# Patient Record
Sex: Female | Born: 1935 | Race: White | Hispanic: No | Marital: Married | Smoking: Current every day smoker
Health system: Southern US, Community
[De-identification: ages and names within clinical notes are randomized; demographics above are authoritative.]

## PROBLEM LIST (undated history)

## (undated) DIAGNOSIS — L409 Psoriasis, unspecified: Secondary | ICD-10-CM

## (undated) DIAGNOSIS — M810 Age-related osteoporosis without current pathological fracture: Secondary | ICD-10-CM

## (undated) DIAGNOSIS — Z972 Presence of dental prosthetic device (complete) (partial): Secondary | ICD-10-CM

## (undated) DIAGNOSIS — E119 Type 2 diabetes mellitus without complications: Secondary | ICD-10-CM

## (undated) DIAGNOSIS — R42 Dizziness and giddiness: Secondary | ICD-10-CM

## (undated) DIAGNOSIS — E039 Hypothyroidism, unspecified: Secondary | ICD-10-CM

## (undated) DIAGNOSIS — C50919 Malignant neoplasm of unspecified site of unspecified female breast: Secondary | ICD-10-CM

## (undated) DIAGNOSIS — J449 Chronic obstructive pulmonary disease, unspecified: Secondary | ICD-10-CM

## (undated) DIAGNOSIS — E079 Disorder of thyroid, unspecified: Secondary | ICD-10-CM

## (undated) DIAGNOSIS — E785 Hyperlipidemia, unspecified: Secondary | ICD-10-CM

## (undated) DIAGNOSIS — F1910 Other psychoactive substance abuse, uncomplicated: Secondary | ICD-10-CM

## (undated) HISTORY — PX: CHOLECYSTECTOMY: SHX55

## (undated) HISTORY — PX: APPENDECTOMY: SHX54

## (undated) HISTORY — DX: Malignant neoplasm of unspecified site of unspecified female breast: C50.919

## (undated) HISTORY — DX: Hypothyroidism, unspecified: E03.9

## (undated) HISTORY — PX: TONSILLECTOMY: SUR1361

## (undated) HISTORY — DX: Age-related osteoporosis without current pathological fracture: M81.0

---

## 2000-12-14 HISTORY — PX: BREAST BIOPSY: SHX20

## 2005-06-26 ENCOUNTER — Ambulatory Visit: Payer: Self-pay | Admitting: Internal Medicine

## 2006-01-05 ENCOUNTER — Ambulatory Visit: Payer: Self-pay | Admitting: Gastroenterology

## 2006-07-01 ENCOUNTER — Ambulatory Visit: Payer: Self-pay | Admitting: Internal Medicine

## 2007-07-05 ENCOUNTER — Ambulatory Visit: Payer: Self-pay | Admitting: Internal Medicine

## 2008-07-05 ENCOUNTER — Ambulatory Visit: Payer: Self-pay | Admitting: Internal Medicine

## 2009-07-08 ENCOUNTER — Ambulatory Visit: Payer: Self-pay | Admitting: Internal Medicine

## 2009-07-16 ENCOUNTER — Ambulatory Visit: Payer: Self-pay | Admitting: Unknown Physician Specialty

## 2010-01-09 ENCOUNTER — Ambulatory Visit: Payer: Self-pay | Admitting: Internal Medicine

## 2011-02-17 ENCOUNTER — Ambulatory Visit: Payer: Self-pay | Admitting: Internal Medicine

## 2012-02-01 ENCOUNTER — Ambulatory Visit: Payer: Self-pay | Admitting: Internal Medicine

## 2012-04-05 ENCOUNTER — Ambulatory Visit: Payer: Self-pay | Admitting: Internal Medicine

## 2013-01-12 ENCOUNTER — Ambulatory Visit: Payer: Self-pay

## 2013-05-18 ENCOUNTER — Ambulatory Visit: Payer: Self-pay | Admitting: Internal Medicine

## 2014-05-22 ENCOUNTER — Ambulatory Visit: Payer: Self-pay | Admitting: Internal Medicine

## 2014-11-22 DIAGNOSIS — H02831 Dermatochalasis of right upper eyelid: Secondary | ICD-10-CM | POA: Insufficient documentation

## 2014-11-22 DIAGNOSIS — H02834 Dermatochalasis of left upper eyelid: Secondary | ICD-10-CM | POA: Insufficient documentation

## 2014-12-14 DIAGNOSIS — C50919 Malignant neoplasm of unspecified site of unspecified female breast: Secondary | ICD-10-CM

## 2014-12-14 HISTORY — DX: Malignant neoplasm of unspecified site of unspecified female breast: C50.919

## 2015-01-03 ENCOUNTER — Ambulatory Visit: Payer: Self-pay | Admitting: Internal Medicine

## 2015-01-08 ENCOUNTER — Ambulatory Visit: Payer: Self-pay | Admitting: Internal Medicine

## 2015-01-14 ENCOUNTER — Ambulatory Visit: Payer: Self-pay | Admitting: Oncology

## 2015-01-28 ENCOUNTER — Ambulatory Visit: Payer: Self-pay | Admitting: Surgery

## 2015-02-05 ENCOUNTER — Ambulatory Visit: Payer: Self-pay | Admitting: Surgery

## 2015-02-05 HISTORY — PX: BREAST EXCISIONAL BIOPSY: SUR124

## 2015-02-12 ENCOUNTER — Ambulatory Visit
Admit: 2015-02-12 | Disposition: A | Discharge status: Home / Self Care | Payer: Self-pay | Attending: Oncology | Admitting: Oncology

## 2015-03-15 ENCOUNTER — Ambulatory Visit
Admit: 2015-03-15 | Disposition: A | Discharge status: Home / Self Care | Payer: Self-pay | Attending: Oncology | Admitting: Oncology

## 2015-03-28 LAB — CBC CANCER CENTER
Basophil #: 0.1 x10 3/mm (ref 0.0–0.1)
Basophil %: 1.2 %
EOS PCT: 3.5 %
Eosinophil #: 0.4 x10 3/mm (ref 0.0–0.7)
HCT: 28.5 % — ABNORMAL LOW (ref 35.0–47.0)
HGB: 8 g/dL — ABNORMAL LOW (ref 12.0–16.0)
Lymphocyte #: 1.8 x10 3/mm (ref 1.0–3.6)
Lymphocyte %: 16.3 %
MCH: 17.2 pg — ABNORMAL LOW (ref 26.0–34.0)
MCHC: 27.9 g/dL — ABNORMAL LOW (ref 32.0–36.0)
MCV: 62 fL — ABNORMAL LOW (ref 80–100)
MONO ABS: 0.9 x10 3/mm (ref 0.2–0.9)
MONOS PCT: 8.4 %
NEUTROS PCT: 70.6 %
Neutrophil #: 7.9 x10 3/mm — ABNORMAL HIGH (ref 1.4–6.5)
PLATELETS: 298 x10 3/mm (ref 150–440)
RBC: 4.61 10*6/uL (ref 3.80–5.20)
RDW: 19 % — ABNORMAL HIGH (ref 11.5–14.5)
WBC: 11.2 x10 3/mm — AB (ref 3.6–11.0)

## 2015-04-04 LAB — CBC CANCER CENTER
BASOS PCT: 2.3 %
Basophil #: 0.2 x10 3/mm — ABNORMAL HIGH (ref 0.0–0.1)
EOS ABS: 0.5 x10 3/mm (ref 0.0–0.7)
Eosinophil %: 6.8 %
HCT: 27.6 % — ABNORMAL LOW (ref 35.0–47.0)
HGB: 8 g/dL — ABNORMAL LOW (ref 12.0–16.0)
LYMPHS PCT: 29.5 %
Lymphocyte #: 2.2 x10 3/mm (ref 1.0–3.6)
MCH: 17.5 pg — AB (ref 26.0–34.0)
MCHC: 28.9 g/dL — AB (ref 32.0–36.0)
MCV: 61 fL — ABNORMAL LOW (ref 80–100)
Monocyte #: 0.7 x10 3/mm (ref 0.2–0.9)
Monocyte %: 9.6 %
NEUTROS ABS: 3.8 x10 3/mm (ref 1.4–6.5)
Neutrophil %: 51.8 %
Platelet: 293 x10 3/mm (ref 150–440)
RBC: 4.54 10*6/uL (ref 3.80–5.20)
RDW: 18.4 % — ABNORMAL HIGH (ref 11.5–14.5)
WBC: 7.4 x10 3/mm (ref 3.6–11.0)

## 2015-04-08 LAB — SURGICAL PATHOLOGY

## 2015-04-11 LAB — CBC CANCER CENTER
Basophil #: 0.2 x10 3/mm — ABNORMAL HIGH (ref 0.0–0.1)
Basophil %: 2.4 %
EOS ABS: 0.4 x10 3/mm (ref 0.0–0.7)
EOS PCT: 6.1 %
HCT: 25.6 % — ABNORMAL LOW (ref 35.0–47.0)
HGB: 7.3 g/dL — AB (ref 12.0–16.0)
LYMPHS ABS: 1.8 x10 3/mm (ref 1.0–3.6)
Lymphocyte %: 25.8 %
MCH: 17.5 pg — ABNORMAL LOW (ref 26.0–34.0)
MCHC: 28.5 g/dL — AB (ref 32.0–36.0)
MCV: 61 fL — ABNORMAL LOW (ref 80–100)
Monocyte #: 0.6 x10 3/mm (ref 0.2–0.9)
Monocyte %: 9.5 %
Neutrophil #: 3.8 x10 3/mm (ref 1.4–6.5)
Neutrophil %: 56.2 %
Platelet: 252 x10 3/mm (ref 150–440)
RBC: 4.17 10*6/uL (ref 3.80–5.20)
RDW: 18.8 % — ABNORMAL HIGH (ref 11.5–14.5)
WBC: 6.8 x10 3/mm (ref 3.6–11.0)

## 2015-04-14 NOTE — Consult Note (Signed)
Reason for Visit: This 80 year old Female patient presents to the clinic for initial evaluation of  breast cancer .   Referred by Dr. Oliva Bustard.  Diagnosis:  Chief Complaint/Diagnosis   80 year old female status post wide local excision sent a node biopsy for stage IA (T1b N0 M0) ER/PR positive HER-2/neu negative invasive mammary carcinoma for whole breast radiation and aromatase inhibitor therapy  Pathology Report pathology report reviewed   Imaging Report mammograms and ultrasound reviewed   Referral Report clinical notes reviewed   Planned Treatment Regimen whole breast radiation plus aromatase inhibitor   HPI   patient is a 80 year old female who presented with self discovered mass in the right breast promptingan ultrasound and mammogram showing a mass in the right breast in the 2:00 position. Underwent ultrasound-guided biopsy showing invasive mammary carcinoma ER/PR positive HER-2/neu not overexpressed. Patient underwent a wide local excision and sentinel node biopsy for 0.6 cm grade 2 invasive mammary carcinoma with margins clear at 2 mm. One sentinel lymph node was negative for metastatic disease there was no lymphovascular invasion noted. Patient's been seen by medical oncology has been offered aromatase inhibitor therapy. She is now referred to radiation oncology for consideration of treatment. She is doing well. She specifically denies breast tenderness cough or bone pain.  Past Hx:    Hypothyroidism:    Osteoporosis:    Cesarean Section:    Breast Biopsy - Right:    Cholecystectomy:    Appendectomy:   Past, Family and Social History:  Past Medical History positive   Gastrointestinal GERD   Endocrine diabetes mellitus; hypothyroidism   Past Surgical History cholecystectomy; C-section   Past Medical History Comments osteoporosis   Family History noncontributory   Social History positive   Social History Comments greater than 50-pack-year smoking history no  EtOH abuse history   Additional Past Medical and Surgical History accompanied by her husband today   Allergies:   Sulfa drugs: Swelling  Codeine: N/V/Diarrhea  Home Meds:  Home Medications: Medication Instructions Status  Calcium 600+D 600 mg-200 units oral tablet 1 tab(s) orally 2 times a day Active  letrozole 2.5 mg oral tablet 1 tab(s) orally once a day Active  Tylenol PM 500 mg-25 mg oral tablet 2 tab(s) orally once a day (at bedtime) Active  LORazepam 0.5 mg oral tablet 1 tab(s) orally 3 times a day, As Needed - for Anxiety, Nervousness Active  levothyroxine 137 mcg (0.137 mg) oral capsule 1 cap(s) orally once a day Active  simvastatin 40 mg oral tablet 1 tab(s) orally once a day (at bedtime) Active  Bayer Aspirin 81 mg oral delayed release tablet 1 tab(s) orally once a day Active  Otezla 30 mg oral tablet 1 tab(s) orally once a day Active  glipiZIDE 10 mg oral tablet 1 tab(s) orally 2 times a day Active  acitretin 10 mg oral capsule 1 cap(s) orally every other day Active  metFORMIN 1000 mg oral tablet 1 tab(s) orally 2 times a day Active  clonazePAM 0.5 mg oral tablet 1 tab(s) orally once a day (at bedtime), As Needed Active  omeprazole 20 mg oral delayed release tablet 1 tab(s) orally once a day Active  Slow Fe (as elemental iron) 45 mg oral tablet, extended release 1 tab(s) orally once a day Active  Norco 325 mg-5 mg oral tablet 1 -2  tab(s) orally every 4 hours as needed for pain Active   Review of Systems:  General negative   Performance Status (ECOG) 0   Skin negative  Breast see HPI   Ophthalmologic negative   ENMT negative   Respiratory and Thorax negative   Cardiovascular negative   Gastrointestinal negative   Genitourinary negative   Musculoskeletal negative   Neurological negative   Psychiatric negative   Hematology/Lymphatics negative   Endocrine negative   Allergic/Immunologic negative   Review of Systems   denies any weight loss,  fatigue, weakness, fever, chills or night sweats. Patient denies any loss of vision, blurred vision. Patient denies any ringing  of the ears or hearing loss. No irregular heartbeat. Patient denies heart murmur or history of fainting. Patient denies any chest pain or pain radiating to her upper extremities. Patient denies any shortness of breath, difficulty breathing at night, cough or hemoptysis. Patient denies any swelling in the lower legs. Patient denies any nausea vomiting, vomiting of blood, or coffee ground material in the vomitus. Patient denies any stomach pain. Patient states has had normal bowel movements no significant constipation or diarrhea. Patient denies any dysuria, hematuria or significant nocturia. Patient denies any problems walking, swelling in the joints or loss of balance. Patient denies any skin changes, loss of hair or loss of weight. Patient denies any excessive worrying or anxiety or significant depression. Patient denies any problems with insomnia. Patient denies excessive thirst, polyuria, polydipsia. Patient denies any swollen glands, patient denies easy bruising or easy bleeding. Patient denies any recent infections, allergies or URI. Patient "s visual fields have not changed significantly in recent time.   Nursing Notes:  Nursing Vital Signs and Chemo Nursing Nursing Notes: *CC Vital Signs Flowsheet:   21-Mar-16 14:21  Temp Temperature 96.3  Pulse Pulse 100  Respirations Respirations 18  SBP SBP 128  DBP DBP 62  Pain Scale (0-10)  0  Current Weight (kg) (kg) 63.8  Height (cm) centimeters 160.2  BSA (m2) 1.6   Physical Exam:  General/Skin/HEENT:  General normal   Skin normal   Eyes normal   ENMT normal   Head and Neck normal   Additional PE well-developed well-nourished female in NAD. Lungs are clear to A&P cardiac examination shows regular rate and rhythm. Abdomen is benign. Right breast is a wide local excision's which is healed well. No dominant mass or  nodularity is noted in either breast in 2 positions examined. No axillary or supraclavicular adenopathy is appreciated.   Breasts/Resp/CV/GI/GU:  Respiratory and Thorax normal   Cardiovascular normal   Gastrointestinal normal   Genitourinary normal   MS/Neuro/Psych/Lymph:  Musculoskeletal normal   Neurological normal   Lymphatics normal   Other Results:  Radiology Results: LabUnknown:    09-Jun-15 11:30, Screening Digital Mammogram  PACS [place]:    23-Feb-16 08:48, Breast Needle Localization -Right Breast  Breast Needle Localization -Right Breast   REASON FOR EXAM:    NL 8A SN 845A SURG 1030A  COMMENTS:       PROCEDURE: MAM - MAM BREAST NEEDLE LOCAL RT  - Feb 05 2015  8:48AM     CLINICAL DATA:  Recently diagnosed invasive mammary carcinoma in the  2 o'clock position of the right breast.    EXAM:  NEEDLE LOCALIZATION OF THE RIGHT BREAST WITH MAMMO GUIDANCE    COMPARISON:  Previous exams.    FINDINGS:  Patient presents for needle localization prior to right lumpectomy.  I met with the patient and we discussed the procedure of needle  localization including benefits and alternatives. We discussed the  high likelihood of a successful procedure. We discussed the risks of  the procedure, including infection, bleeding, tissue injury, and  further surgery. Informed, written consent was given. The usual  time-out protocol was performed immediately prior to the procedure.    Using mammographic guidance, sterile technique, 2% lidocaine and a 5  cm modified Kopans needle, the recently biopsied mass and  subsequently placed biopsy marker clip inthe upper inner right  breast were localized using medial approach. The films were marked  for Dr. Tamala Julian.     IMPRESSION:  Needle localization right breast. No apparent complications.  Electronically Signed    By: Claudie Revering M.D.    On: 02/05/2015 09:18         Verified By: Gerald Stabs, M.D.,   Relevent  Results:   Relevant Scans and Labs mammograms and ultrasound reviewed   Assessment and Plan: Impression:   stage IA invasive mammary carcinoma the right breast as was wide local excision and sentinel node biopsy in 80 year old female ER/PR positive HER-2/neu not overexpressed for whole breast radiation plus aromatase inhibitor Plan:   at this time I have recommended whole breast radiation. With treatment hypofractionated regimen over 4 weeks boosting her scar another 1600 cGy in 8 fractions based on the close 2 mm margin. Risks and benefits of treatment including skin reaction, fatigue, inclusion of some superficial lung, possible alteration of blood counts, all were discussed in detail with the patient and her husband. They both seem to comprehend my treatment plan well.I have set up and ordered CT simulation later this week. Patient will also be candidate for aromatase inhibitor therapy after completion of radiation.  I would like to take this opportunity for allowing me to participate in the care of your patient..  Fax to Physician:  Physicians To Recieve Fax: Madelyn Brunner, MD - [phone] Rochel Brome - [phone].  Electronic Signatures: Armstead Peaks (MD)  (Signed 21-Mar-16 15:09)  Authored: HPI, Diagnosis, Past Hx, PFSH, Allergies, Home Meds, ROS, Nursing Notes, Physical Exam, Other Results, Relevent Results, Encounter Assessment and Plan, Fax to Physician   Last Updated: 21-Mar-16 15:09 by Armstead Peaks (MD)

## 2015-04-14 NOTE — Op Note (Signed)
PATIENT NAME:  [name], [name] MR#:  [id number] DATE OF BIRTH:  1935-12-06  DATE OF PROCEDURE:  02/05/2015  PREOPERATIVE DIAGNOSIS: Carcinoma of the right breast.   POSTOPERATIVE DIAGNOSIS: Carcinoma of the right breast.   PROCEDURE: Right partial mastectomy with axillary sentinel lymph node biopsy.   SURGEON: Rochel Brome, M.D.   ANESTHESIA: General.   INDICATIONS: This 80 year old female recently discovered a palpable mass at the 2 o'clock position of the right breast. She had a mammogram and ultrasound demonstrating a 9 mm density at this site. Biopsies demonstrated an infiltrating mammary carcinoma. She had preoperative x-ray needle localization and injection of radioactive technetium sulfur colloid.   DESCRIPTION OF PROCEDURE: The patient was placed on the operating table in the supine position under general anesthesia. The dressing was removed from the right breast exposing the Kopans wire, which entered the breast at approximately the 2 o'clock position. Mammogram images were reviewed prior to surgery. The wire was cut 2 cm from the skin. The breast and axilla were prepared with ChloraPrep and draped in a sterile manner.   An obliquely oriented curvilinear incision was made from approximately 12:30 to 3:30 position of the right breast, just medial to the entrance point of the wire just about 2 cm from the areola. An ellipse of skin was included with the specimen and dissection was carried down to encounter the wire and also could palpate the mass and dissection was carried out around the mass, removing some normal-appearing tissue with the mass. The 330 end of the skin ellipse was tagged with a 3-0 nylon stitch. Also margin maps were used to mark the medial, lateral, cranial, caudal, and deep margins. The mass was submitted for specimen mammogram and pathology. The wound was inspected and determined hemostasis was intact.   Next, attention was turned to the axilla which was probed with a  gamma counter demonstrating location of radioactivity in the inferior aspect of the axilla. An obliquely oriented incision was made some 4 cm in length, carried down through subcutaneous tissues, through superficial fascia. Several small bleeding points were cauterized. Dissection was carried down deep into the axilla adjacent to the rib cage and using the gamma counter demonstrated radioactivity and an axillary lymph node which was dissected free from surrounding structures but also included a small amount of fat surrounding the lymph node. The ex vivo count was in the range of 25-35, background count was minimal. The lymph node was submitted for routine pathology. The wound was further explored with digital examination and found no hard palpable mass.  The hemostasis was intact. The axillary wound was closed with 4-0 chromic subcutaneous sutures in running 4-0 Monocryl subcuticular suture.  It should be noted that the radiologist called to report that the biopsy marker was seen centrally located in the specimen, and the specimen was sent for pathology, and the pathologist later called back to report that the margins of resection appeared clear.   The partial mastectomy wound was further inspected and it determined hemostasis was intact. Subcutaneous tissues for both incisions were infiltrated with 0.5% Sensorcaine with epinephrine. The subcutaneous tissues were approximated with interrupted inverted 4-0 chromic. The skin was closed with running 4-0 Monocryl subcuticular suture. Both wounds were treated with LiquiBand and allowed to dry.   The patient appeared to tolerate the procedure satisfactorily and was prepared for transfer to the recovery room.   ____________________________ Lenna Sciara. Rochel Brome, MD jws:mc D: 02/05/2015 11:50:13 ET T: 02/05/2015 16:24:38 ET JOB#: [id number]  cc: J.  Rochel Brome, MD, <Dictator> Loreli Dollar MD ELECTRONICALLY SIGNED 02/06/2015 18:11

## 2015-04-14 NOTE — Op Note (Signed)
PATIENT NAME:  [name], [name] MR#:  [id number] DATE OF BIRTH:  Nov 19, 1935  DATE OF PROCEDURE:  02/05/2015  PREOPERATIVE DIAGNOSIS: Carcinoma of the right breast.   POSTOPERATIVE DIAGNOSIS: Carcinoma of the right breast.   PROCEDURE: Right partial mastectomy with axillary sentinel lymph node biopsy.   SURGEON: Rochel Brome, M.D.   ANESTHESIA: General.   INDICATIONS: This 81 year old female recently discovered a palpable mass at the 2 o'clock position of the right breast. She had a mammogram and ultrasound demonstrating a 9 mm density at this site. Biopsies demonstrated an infiltrating mammary carcinoma. She had preoperative x-ray needle localization and injection of radioactive technetium sulfur colloid.   DESCRIPTION OF PROCEDURE: The patient was placed on the operating table in the supine position under general anesthesia. The dressing was removed from the right breast exposing the Kopans wire, which entered the breast at approximately the 2 o'clock position. Mammogram images were reviewed prior to surgery. The wire was cut 2 cm from the skin. The breast and axilla were prepared with ChloraPrep and draped in a sterile manner.   An obliquely oriented curvilinear incision was made from approximately 12:30 to 3:30 position of the right breast, just medial to the entrance point of the wire just about 2 cm from the areola. An ellipse of skin was included with the specimen and dissection was carried down to encounter the wire and also could palpate the mass and dissection was carried out around the mass, removing some normal-appearing tissue with the mass. The 330 end of the skin ellipse was tagged with a 3-0 nylon stitch. Also margin maps were used to mark the medial, lateral, cranial, caudal, and deep margins. The mass was submitted for specimen mammogram and pathology. The wound was inspected and determined hemostasis was intact.   Next, attention was turned to the axilla which was probed with a  gamma counter demonstrating location of radioactivity in the inferior aspect of the axilla. An obliquely oriented incision was made some 4 cm in length, carried down through subcutaneous tissues, through superficial fascia. Several small bleeding points were cauterized. Dissection was carried down deep into the axilla adjacent to the rib cage and using the gamma counter demonstrated radioactivity and an axillary lymph node which was dissected free from surrounding structures but also included a small amount of fat surrounding the lymph node. The ex vivo count was in the range of 25-35, background count was minimal. The lymph node was submitted for routine pathology. The wound was further explored with digital examination and found no hard palpable mass.  The hemostasis was intact. The axillary wound was closed with 4-0 chromic subcutaneous sutures in running 4-0 Monocryl subcuticular suture.  It should be noted that the radiologist called to report that the biopsy marker was seen centrally located in the specimen, and the specimen was sent for pathology, and the pathologist later called back to report that the margins of resection appeared clear.   The partial mastectomy wound was further inspected and it determined hemostasis was intact. Subcutaneous tissues for both incisions were infiltrated with 0.5% Sensorcaine with epinephrine. The subcutaneous tissues were approximated with interrupted inverted 4-0 chromic. The skin was closed with running 4-0 Monocryl subcuticular suture. Both wounds were treated with LiquiBand and allowed to dry.   The patient appeared to tolerate the procedure satisfactorily and was prepared for transfer to the recovery room.   ____________________________ Lenna Sciara. Rochel Brome, MD jws:mc D: 02/05/2015 11:50:13 ET T: 02/05/2015 16:24:38 ET JOB#: [id number]  cc: J.  Rochel Brome, MD, <Dictator> Loreli Dollar MD ELECTRONICALLY SIGNED 02/06/2015 18:11

## 2015-04-15 ENCOUNTER — Ambulatory Visit: Payer: Medicare Other | Admitting: Radiation Oncology

## 2015-04-15 DIAGNOSIS — Z510 Encounter for antineoplastic radiation therapy: Secondary | ICD-10-CM | POA: Diagnosis present

## 2015-04-15 DIAGNOSIS — Z170 Estrogen receptor positive status [ER+]: Secondary | ICD-10-CM | POA: Insufficient documentation

## 2015-04-15 DIAGNOSIS — C50211 Malignant neoplasm of upper-inner quadrant of right female breast: Secondary | ICD-10-CM | POA: Insufficient documentation

## 2015-04-16 DIAGNOSIS — Z510 Encounter for antineoplastic radiation therapy: Secondary | ICD-10-CM | POA: Diagnosis not present

## 2015-04-17 DIAGNOSIS — Z510 Encounter for antineoplastic radiation therapy: Secondary | ICD-10-CM | POA: Diagnosis not present

## 2015-04-18 DIAGNOSIS — Z510 Encounter for antineoplastic radiation therapy: Secondary | ICD-10-CM | POA: Diagnosis not present

## 2015-04-19 ENCOUNTER — Ambulatory Visit
Admission: RE | Admit: 2015-04-19 | Discharge: 2015-04-19 | Disposition: A | Discharge status: Home / Self Care | Payer: Medicare Other | Source: Ambulatory Visit | Attending: Radiation Oncology | Admitting: Radiation Oncology

## 2015-04-19 DIAGNOSIS — Z510 Encounter for antineoplastic radiation therapy: Secondary | ICD-10-CM | POA: Diagnosis not present

## 2015-05-30 ENCOUNTER — Ambulatory Visit: Payer: Self-pay | Admitting: Radiation Oncology

## 2015-06-11 DIAGNOSIS — Z853 Personal history of malignant neoplasm of breast: Secondary | ICD-10-CM | POA: Insufficient documentation

## 2015-06-18 ENCOUNTER — Ambulatory Visit: Payer: Self-pay | Admitting: Oncology

## 2015-06-18 ENCOUNTER — Other Ambulatory Visit: Payer: Self-pay

## 2015-10-01 ENCOUNTER — Other Ambulatory Visit: Payer: Self-pay | Admitting: Internal Medicine

## 2015-10-01 DIAGNOSIS — N63 Unspecified lump in unspecified breast: Secondary | ICD-10-CM

## 2015-10-10 ENCOUNTER — Ambulatory Visit
Admission: RE | Admit: 2015-10-10 | Discharge: 2015-10-10 | Disposition: A | Discharge status: Home / Self Care | Payer: Medicare Other | Source: Ambulatory Visit | Attending: Internal Medicine | Admitting: Internal Medicine

## 2015-10-10 ENCOUNTER — Other Ambulatory Visit: Payer: Self-pay | Admitting: Internal Medicine

## 2015-10-10 DIAGNOSIS — D242 Benign neoplasm of left breast: Secondary | ICD-10-CM | POA: Diagnosis not present

## 2015-10-10 DIAGNOSIS — N63 Unspecified lump in unspecified breast: Secondary | ICD-10-CM

## 2015-10-10 DIAGNOSIS — R921 Mammographic calcification found on diagnostic imaging of breast: Secondary | ICD-10-CM | POA: Diagnosis not present

## 2015-10-14 ENCOUNTER — Other Ambulatory Visit: Payer: Self-pay | Admitting: Internal Medicine

## 2015-10-14 DIAGNOSIS — N63 Unspecified lump in unspecified breast: Secondary | ICD-10-CM

## 2016-01-23 ENCOUNTER — Other Ambulatory Visit: Payer: Self-pay | Admitting: Internal Medicine

## 2016-01-23 DIAGNOSIS — R1012 Left upper quadrant pain: Secondary | ICD-10-CM

## 2016-01-27 ENCOUNTER — Ambulatory Visit
Admission: RE | Admit: 2016-01-27 | Discharge: 2016-01-27 | Disposition: A | Discharge status: Home / Self Care | Payer: Medicare Other | Source: Ambulatory Visit | Attending: Internal Medicine | Admitting: Internal Medicine

## 2016-01-27 DIAGNOSIS — R1012 Left upper quadrant pain: Secondary | ICD-10-CM | POA: Diagnosis present

## 2016-01-27 DIAGNOSIS — K5730 Diverticulosis of large intestine without perforation or abscess without bleeding: Secondary | ICD-10-CM | POA: Diagnosis not present

## 2016-01-27 HISTORY — DX: Type 2 diabetes mellitus without complications: E11.9

## 2016-01-27 MED ORDER — IOHEXOL 300 MG/ML  SOLN
100.0000 mL | Freq: Once | INTRAMUSCULAR | Status: AC | PRN
  Administered 2016-01-27: 100 mL via INTRAVENOUS

## 2016-03-04 ENCOUNTER — Encounter: Payer: Self-pay | Admitting: *Deleted

## 2016-03-05 ENCOUNTER — Ambulatory Visit: Payer: Medicare Other | Admitting: Anesthesiology

## 2016-03-05 ENCOUNTER — Ambulatory Visit
Admission: RE | Admit: 2016-03-05 | Discharge: 2016-03-05 | Disposition: A | Discharge status: Home / Self Care | Payer: Medicare Other | Source: Ambulatory Visit | Attending: Unknown Physician Specialty | Admitting: Unknown Physician Specialty

## 2016-03-05 ENCOUNTER — Encounter: Payer: Self-pay | Admitting: *Deleted

## 2016-03-05 ENCOUNTER — Encounter
Admission: RE | Disposition: A | Discharge status: Home / Self Care | Payer: Self-pay | Source: Ambulatory Visit | Attending: Unknown Physician Specialty

## 2016-03-05 DIAGNOSIS — Z7982 Long term (current) use of aspirin: Secondary | ICD-10-CM | POA: Insufficient documentation

## 2016-03-05 DIAGNOSIS — Q2733 Arteriovenous malformation of digestive system vessel: Secondary | ICD-10-CM | POA: Insufficient documentation

## 2016-03-05 DIAGNOSIS — Z8249 Family history of ischemic heart disease and other diseases of the circulatory system: Secondary | ICD-10-CM | POA: Insufficient documentation

## 2016-03-05 DIAGNOSIS — Z833 Family history of diabetes mellitus: Secondary | ICD-10-CM | POA: Insufficient documentation

## 2016-03-05 DIAGNOSIS — E039 Hypothyroidism, unspecified: Secondary | ICD-10-CM | POA: Insufficient documentation

## 2016-03-05 DIAGNOSIS — Z882 Allergy status to sulfonamides status: Secondary | ICD-10-CM | POA: Insufficient documentation

## 2016-03-05 DIAGNOSIS — R1013 Epigastric pain: Secondary | ICD-10-CM | POA: Diagnosis not present

## 2016-03-05 DIAGNOSIS — L409 Psoriasis, unspecified: Secondary | ICD-10-CM | POA: Insufficient documentation

## 2016-03-05 DIAGNOSIS — E119 Type 2 diabetes mellitus without complications: Secondary | ICD-10-CM | POA: Diagnosis not present

## 2016-03-05 DIAGNOSIS — K319 Disease of stomach and duodenum, unspecified: Secondary | ICD-10-CM | POA: Diagnosis not present

## 2016-03-05 DIAGNOSIS — K640 First degree hemorrhoids: Secondary | ICD-10-CM | POA: Diagnosis not present

## 2016-03-05 DIAGNOSIS — R1012 Left upper quadrant pain: Secondary | ICD-10-CM | POA: Insufficient documentation

## 2016-03-05 DIAGNOSIS — F17200 Nicotine dependence, unspecified, uncomplicated: Secondary | ICD-10-CM | POA: Insufficient documentation

## 2016-03-05 DIAGNOSIS — J449 Chronic obstructive pulmonary disease, unspecified: Secondary | ICD-10-CM | POA: Diagnosis not present

## 2016-03-05 DIAGNOSIS — Z836 Family history of other diseases of the respiratory system: Secondary | ICD-10-CM | POA: Insufficient documentation

## 2016-03-05 DIAGNOSIS — R12 Heartburn: Secondary | ICD-10-CM | POA: Diagnosis not present

## 2016-03-05 DIAGNOSIS — Z885 Allergy status to narcotic agent status: Secondary | ICD-10-CM | POA: Diagnosis not present

## 2016-03-05 DIAGNOSIS — K219 Gastro-esophageal reflux disease without esophagitis: Secondary | ICD-10-CM | POA: Diagnosis not present

## 2016-03-05 DIAGNOSIS — K5730 Diverticulosis of large intestine without perforation or abscess without bleeding: Secondary | ICD-10-CM | POA: Insufficient documentation

## 2016-03-05 DIAGNOSIS — D123 Benign neoplasm of transverse colon: Secondary | ICD-10-CM | POA: Insufficient documentation

## 2016-03-05 DIAGNOSIS — E785 Hyperlipidemia, unspecified: Secondary | ICD-10-CM | POA: Insufficient documentation

## 2016-03-05 DIAGNOSIS — Z79899 Other long term (current) drug therapy: Secondary | ICD-10-CM | POA: Insufficient documentation

## 2016-03-05 DIAGNOSIS — Z9049 Acquired absence of other specified parts of digestive tract: Secondary | ICD-10-CM | POA: Insufficient documentation

## 2016-03-05 DIAGNOSIS — M810 Age-related osteoporosis without current pathological fracture: Secondary | ICD-10-CM | POA: Insufficient documentation

## 2016-03-05 DIAGNOSIS — C50919 Malignant neoplasm of unspecified site of unspecified female breast: Secondary | ICD-10-CM | POA: Insufficient documentation

## 2016-03-05 DIAGNOSIS — K2970 Gastritis, unspecified, without bleeding: Secondary | ICD-10-CM | POA: Diagnosis not present

## 2016-03-05 DIAGNOSIS — K449 Diaphragmatic hernia without obstruction or gangrene: Secondary | ICD-10-CM | POA: Insufficient documentation

## 2016-03-05 DIAGNOSIS — D509 Iron deficiency anemia, unspecified: Secondary | ICD-10-CM | POA: Diagnosis not present

## 2016-03-05 DIAGNOSIS — R1084 Generalized abdominal pain: Secondary | ICD-10-CM | POA: Insufficient documentation

## 2016-03-05 DIAGNOSIS — Z7984 Long term (current) use of oral hypoglycemic drugs: Secondary | ICD-10-CM | POA: Diagnosis not present

## 2016-03-05 DIAGNOSIS — Z923 Personal history of irradiation: Secondary | ICD-10-CM | POA: Diagnosis not present

## 2016-03-05 HISTORY — DX: Chronic obstructive pulmonary disease, unspecified: J44.9

## 2016-03-05 HISTORY — DX: Psoriasis, unspecified: L40.9

## 2016-03-05 HISTORY — DX: Other psychoactive substance abuse, uncomplicated: F19.10

## 2016-03-05 HISTORY — DX: Disorder of thyroid, unspecified: E07.9

## 2016-03-05 HISTORY — DX: Hyperlipidemia, unspecified: E78.5

## 2016-03-05 HISTORY — PX: COLONOSCOPY WITH PROPOFOL: SHX5780

## 2016-03-05 HISTORY — PX: ESOPHAGOGASTRODUODENOSCOPY (EGD) WITH PROPOFOL: SHX5813

## 2016-03-05 LAB — GLUCOSE, CAPILLARY: Glucose-Capillary: 190 mg/dL — ABNORMAL HIGH (ref 65–99)

## 2016-03-05 SURGERY — COLONOSCOPY WITH PROPOFOL
Anesthesia: General

## 2016-03-05 MED ORDER — FENTANYL CITRATE (PF) 100 MCG/2ML IJ SOLN
INTRAMUSCULAR | Status: DC | PRN
  Administered 2016-03-05: 50 ug via INTRAVENOUS

## 2016-03-05 MED ORDER — PROPOFOL 10 MG/ML IV BOLUS
INTRAVENOUS | Status: DC | PRN
  Administered 2016-03-05: 50 mg via INTRAVENOUS

## 2016-03-05 MED ORDER — MIDAZOLAM HCL 5 MG/5ML IJ SOLN
INTRAMUSCULAR | Status: DC | PRN
  Administered 2016-03-05: 1 mg via INTRAVENOUS

## 2016-03-05 MED ORDER — LIDOCAINE HCL (CARDIAC) 20 MG/ML IV SOLN
INTRAVENOUS | Status: DC | PRN
  Administered 2016-03-05: 30 mg via INTRAVENOUS

## 2016-03-05 MED ORDER — SODIUM CHLORIDE 0.9 % IV SOLN: INTRAVENOUS | Status: DC

## 2016-03-05 MED ORDER — PHENYLEPHRINE HCL 10 MG/ML IJ SOLN
INTRAMUSCULAR | Status: DC | PRN
  Administered 2016-03-05: 100 ug via INTRAVENOUS

## 2016-03-05 MED ORDER — PROPOFOL 500 MG/50ML IV EMUL
INTRAVENOUS | Status: DC | PRN
  Administered 2016-03-05: 140 ug/kg/min via INTRAVENOUS

## 2016-03-05 MED ORDER — SODIUM CHLORIDE 0.9 % IV SOLN
INTRAVENOUS | Status: DC
  Administered 2016-03-05: 09:00:00 via INTRAVENOUS

## 2016-03-05 NOTE — Anesthesia Preprocedure Evaluation (Signed)
Anesthesia Evaluation  Patient identified by MRN, date of birth, ID band Patient awake    Reviewed: Allergy & Precautions, NPO status , Patient's Chart, lab work & pertinent test results  History of Anesthesia Complications Negative for: history of anesthetic complications  Airway Mallampati: III       Dental  (+) Partial Upper   Pulmonary neg pulmonary ROS, COPD,  COPD inhaler, Current Smoker,           Cardiovascular      Neuro/Psych negative neurological ROS     GI/Hepatic negative GI ROS, Neg liver ROS, GERD  Medicated,  Endo/Other  diabetes, Type 2, Oral Hypoglycemic AgentsHypothyroidism   Renal/GU negative Renal ROS     Musculoskeletal   Abdominal   Peds  Hematology negative hematology ROS (+) anemia ,   Anesthesia Other Findings   Reproductive/Obstetrics                             Anesthesia Physical Anesthesia Plan  ASA: III  Anesthesia Plan: General   Post-op Pain Management:    Induction: Intravenous  Airway Management Planned: Nasal Cannula  Additional Equipment:   Intra-op Plan:   Post-operative Plan:   Informed Consent: I have reviewed the patients History and Physical, chart, labs and discussed the procedure including the risks, benefits and alternatives for the proposed anesthesia with the patient or authorized representative who has indicated his/her understanding and acceptance.     Plan Discussed with:   Anesthesia Plan Comments:         Anesthesia Quick Evaluation

## 2016-03-05 NOTE — Op Note (Signed)
[facility] Gastroenterology Patient Name: [name] Procedure Date: 03/05/2016 9:41 AM MRN: [id number] Account #: [id number] Date of Birth: [birth year] Admit Type: Outpatient Age: 81 Room: [facility] ENDO ROOM 1 Gender: Female Note Status: Finalized Procedure:            Colonoscopy Indications:          Generalized abdominal pain Providers:            Manya Silvas, MD Referring MD:         Hewitt Blade. Sarina Ser, MD (Referring MD) Medicines:            Propofol per Anesthesia Complications:        No immediate complications. Procedure:            Pre-Anesthesia Assessment:                       - After reviewing the risks and benefits, the patient                        was deemed in satisfactory condition to undergo the                        procedure.                       After obtaining informed consent, the colonoscope was                        passed under direct vision. Throughout the procedure,                        the patient's blood pressure, pulse, and oxygen                        saturations were monitored continuously. The                        Colonoscope was introduced through the anus and                        advanced to the the cecum, identified by appendiceal                        orifice and ileocecal valve. The colonoscopy was                        performed without difficulty. The patient tolerated the                        procedure well. The quality of the bowel preparation                        was adequate to identify polyps. Findings:      Three medium-sized localized angioectasias without bleeding were found       in the cecum. Coagulation for bleeding prevention using argon plasma at       0.5 liters/minute and 20 watts was successful. To prevent bleeding       post-intervention, five hemostatic clips were successfully placed. There       was no bleeding at the end of the procedure.  Many small-mouthed diverticula were  found in the sigmoid colon and       descending colon.      Internal hemorrhoids were found during endoscopy. The hemorrhoids were       small and Grade I (internal hemorrhoids that do not prolapse). Impression:           - Three non-bleeding colonic angioectasias. Treated                        with argon plasma coagulation (APC). Clips were placed.                       - Diverticulosis in the sigmoid colon and in the                        descending colon.                       - Internal hemorrhoids.                       - No specimens collected. Recommendation:       - The findings and recommendations were discussed with                        the patient's family. Manya Silvas, MD 03/05/2016 10:51:51 AM This report has been signed electronically. Number of Addenda: 0 Note Initiated On: 03/05/2016 9:41 AM Scope Withdrawal Time: 0 hours 24 minutes 16 seconds  Total Procedure Duration: 0 hours 32 minutes 24 seconds       [facility]

## 2016-03-05 NOTE — Op Note (Signed)
[facility] Gastroenterology Patient Name: [name] Procedure Date: 03/05/2016 9:44 AM MRN: [id number] Account #: [id number] Date of Birth: 1935/11/03 Admit Type: Outpatient Age: 81 Room: [facility] ENDO ROOM 1 Gender: Female Note Status: Finalized Procedure:            Upper GI endoscopy Indications:          Epigastric abdominal pain, Heartburn Providers:            Manya Silvas, MD Referring MD:         Hewitt Blade. Sarina Ser, MD (Referring MD) Medicines:            Propofol per Anesthesia Complications:        No immediate complications. Procedure:            Pre-Anesthesia Assessment:                       - After reviewing the risks and benefits, the patient                        was deemed in satisfactory condition to undergo the                        procedure.                       After obtaining informed consent, the endoscope was                        passed under direct vision. Throughout the procedure,                        the patient's blood pressure, pulse, and oxygen                        saturations were monitored continuously. The Endoscope                        was introduced through the mouth, and advanced to the                        second part of duodenum. The upper GI endoscopy was                        accomplished without difficulty. The patient tolerated                        the procedure well. Findings:      The examined esophagus was normal.      A small hiatal hernia was present.      Diffuse minimal inflammation characterized by erythema and granularity       was found in the gastric body and in the gastric antrum. Biopsies were       taken with a cold forceps for histology. Biopsies were taken with a cold       forceps for Helicobacter pylori testing.      A single small no bleeding angioectasia was found on the lesser       curvature of the stomach. Destroy AVM, four hemostatic clips were       successfully placed.  There was no bleeding at the end of the procedure.  The examined duodenum was normal. Impression:           - Normal esophagus.                       - Small hiatal hernia.                       - Gastritis. Biopsied.                       - A single non-bleeding angioectasia in the stomach.                        Clips were placed.                       - Normal examined duodenum. Recommendation:       - Await pathology results. Manya Silvas, MD 03/05/2016 10:09:44 AM This report has been signed electronically. Number of Addenda: 0 Note Initiated On: 03/05/2016 9:44 AM      [facility]

## 2016-03-05 NOTE — H&P (Signed)
Primary Care Physician:  Madelyn Brunner, MD Primary Gastroenterologist:  Dr. Vira Agar  Pre-Procedure History & Physical: HPI:  [name] is a 81 y.o. female is here for an endoscopy and colonoscopy.   Past Medical History  Diagnosis Date  . Hypothyroidism   . Osteoporosis   . Diabetes mellitus without complication ([place])   . Breast cancer ([place]) 2016    Right- radiation  . COPD (chronic obstructive pulmonary disease) ([place])   . Hyperlipidemia   . Psoriasis   . Thyroid disease   . Substance abuse     tobacco    Past Surgical History  Procedure Laterality Date  . Cholecystectomy    . Appendectomy    . Breast biopsy Right     neg  . Tonsillectomy    . Breast biopsy    . Cesarean section      Prior to Admission medications   Medication Sig Start Date End Date Taking? Authorizing Provider  clobetasol (TEMOVATE) 0.05 % GEL Apply topically 2 (two) times daily.   Yes Historical Provider, MD  diphenhydramine-acetaminophen (TYLENOL PM) 25-500 MG TABS tablet Take 1 tablet by mouth at bedtime as needed.   Yes Historical Provider, MD  acitretin (SORIATANE) 10 MG capsule Take 10 mg by mouth every other day.    Historical Provider, MD  Apremilast 30 MG TABS Take 1 tablet by mouth daily.    Historical Provider, MD  aspirin 81 MG tablet Take 81 mg by mouth daily.    Historical Provider, MD  calcium-vitamin D (OSCAL WITH D) 500-200 MG-UNIT per tablet Take 1 tablet by mouth 2 (two) times daily.    Historical Provider, MD  clonazePAM (KLONOPIN) 0.5 MG tablet Take 0.5 mg by mouth 2 (two) times daily as needed for anxiety.    Historical Provider, MD  Ferrous Sulfate (SLOW FE PO) Take 1 tablet by mouth daily.    Historical Provider, MD  Ferrous Sulfate Dried 140 (45 FE) MG TBCR Take by mouth.    Historical Provider, MD  glipiZIDE (GLUCOTROL) 10 MG tablet Take 10 mg by mouth 2 (two) times daily before a meal.    Historical Provider, MD  HYDROcodone-acetaminophen (NORCO/VICODIN) 5-325 MG  per tablet Take 1 tablet by mouth every 4 (four) hours as needed for moderate pain.    Historical Provider, MD  letrozole (FEMARA) 2.5 MG tablet Take 2.5 mg by mouth daily.    Historical Provider, MD  levothyroxine (SYNTHROID, LEVOTHROID) 137 MCG tablet Take 137 mcg by mouth daily before breakfast.    Historical Provider, MD  LORazepam (ATIVAN) 0.5 MG tablet Take 0.5 mg by mouth every 8 (eight) hours.    Historical Provider, MD  metFORMIN (GLUCOPHAGE) 1000 MG tablet Take 1,000 mg by mouth 2 (two) times daily with a meal.    Historical Provider, MD  omeprazole (PRILOSEC) 20 MG capsule Take 20 mg by mouth daily.    Historical Provider, MD  simvastatin (ZOCOR) 40 MG tablet Take 40 mg by mouth daily.    Historical Provider, MD    Allergies as of 02/25/2016 - Review Complete 01/27/2016  Allergen Reaction Noted  . Codeine Diarrhea 04/11/2015  . Sulfa antibiotics Swelling 04/11/2015    History reviewed. No pertinent family history.  Social History   Social History  . Marital Status: Married    Spouse Name: N/A  . Number of Children: N/A  . Years of Education: N/A   Occupational History  . Not on file.   Social History Main  Topics  . Smoking status: Current Every Day Smoker -- 1.00 packs/day for 50 years  . Smokeless tobacco: Not on file  . Alcohol Use: No  . Drug Use: No  . Sexual Activity: Not on file   Other Topics Concern  . Not on file   Social History Narrative    Review of Systems: See HPI, otherwise negative ROS  Physical Exam: BP 146/64 mmHg  Pulse 84  Temp(Src) 98.3 F (36.8 C) (Tympanic)  Resp 20  Ht 5\' 4"  (1.626 m)  Wt 63.504 kg (140 lb)  BMI 24.02 kg/m2  SpO2 97% General:   Alert,  pleasant and cooperative in NAD Head:  Normocephalic and atraumatic. Neck:  Supple; no masses or thyromegaly. Lungs:  Clear throughout to auscultation.   Decreased breath sounds somewhat, no rales or ronchi Heart:  Regular rate and rhythm. Abdomen:  Soft, nontender and  nondistended. Normal bowel sounds, without guarding, and without rebound.   Neurologic:  Alert and  oriented x4;  grossly normal neurologically.  Impression/Plan: [name] is here for an endoscopy and colonoscopy to be performed for generalized abdominal pain, IDA, GERD, LUQ abd pain.  Risks, benefits, limitations, and alternatives regarding  endoscopy and colonoscopy have been reviewed with the patient.  Questions have been answered.  All parties agreeable.   Gaylyn Cheers, MD  03/05/2016, 9:38 AM

## 2016-03-05 NOTE — Anesthesia Postprocedure Evaluation (Signed)
Anesthesia Post Note  Patient: BUFFY JARECKI  Procedure(s) Performed: Procedure(s) (LRB): COLONOSCOPY WITH PROPOFOL (N/A) ESOPHAGOGASTRODUODENOSCOPY (EGD) WITH PROPOFOL (N/A)  Patient location during evaluation: PACU Anesthesia Type: General Level of consciousness: awake and alert Pain management: pain level controlled Vital Signs Assessment: post-procedure vital signs reviewed and stable Respiratory status: spontaneous breathing and respiratory function stable Cardiovascular status: stable Anesthetic complications: no    Last Vitals:  Filed Vitals:   03/05/16 1120 03/05/16 1130  BP: 126/63 126/63  Pulse:    Temp:    Resp:      Last Pain: There were no vitals filed for this visit.               KEPHART,WILLIAM K

## 2016-03-05 NOTE — Transfer of Care (Signed)
Immediate Anesthesia Transfer of Care Note  Patient: [name]  Procedure(s) Performed: Procedure(s): COLONOSCOPY WITH PROPOFOL (N/A) ESOPHAGOGASTRODUODENOSCOPY (EGD) WITH PROPOFOL (N/A)  Patient Location: PACU and Short Stay  Anesthesia Type:General  Level of Consciousness: awake, oriented and patient cooperative  Airway & Oxygen Therapy: Patient Spontanous Breathing and Patient connected to nasal cannula oxygen  Post-op Assessment: Report given to RN and Post -op Vital signs reviewed and stable  Post vital signs: Reviewed and stable  Last Vitals:  Filed Vitals:   03/05/16 0833  BP: 146/64  Pulse: 84  Temp: 36.8 C  Resp: 20    Complications: No apparent anesthesia complications

## 2016-03-07 ENCOUNTER — Encounter: Payer: Self-pay | Admitting: Unknown Physician Specialty

## 2016-03-09 LAB — SURGICAL PATHOLOGY

## 2016-07-02 ENCOUNTER — Other Ambulatory Visit: Payer: Self-pay | Admitting: Internal Medicine

## 2016-07-02 DIAGNOSIS — M5442 Lumbago with sciatica, left side: Secondary | ICD-10-CM

## 2016-07-07 ENCOUNTER — Other Ambulatory Visit: Payer: Self-pay | Admitting: Internal Medicine

## 2016-07-07 DIAGNOSIS — M5442 Lumbago with sciatica, left side: Principal | ICD-10-CM

## 2016-07-07 DIAGNOSIS — G8929 Other chronic pain: Secondary | ICD-10-CM

## 2016-07-20 ENCOUNTER — Ambulatory Visit: Payer: Medicare Other

## 2016-07-27 ENCOUNTER — Other Ambulatory Visit: Payer: Self-pay | Admitting: Internal Medicine

## 2016-07-27 DIAGNOSIS — R1011 Right upper quadrant pain: Secondary | ICD-10-CM

## 2016-08-06 ENCOUNTER — Ambulatory Visit
Admission: RE | Admit: 2016-08-06 | Discharge: 2016-08-06 | Disposition: A | Discharge status: Home / Self Care | Payer: Medicare Other | Source: Ambulatory Visit | Attending: Internal Medicine | Admitting: Internal Medicine

## 2016-08-06 DIAGNOSIS — R1011 Right upper quadrant pain: Secondary | ICD-10-CM | POA: Diagnosis present

## 2016-08-06 DIAGNOSIS — K838 Other specified diseases of biliary tract: Secondary | ICD-10-CM | POA: Diagnosis not present

## 2016-08-06 DIAGNOSIS — Z9049 Acquired absence of other specified parts of digestive tract: Secondary | ICD-10-CM | POA: Diagnosis not present

## 2016-09-07 ENCOUNTER — Other Ambulatory Visit: Payer: Self-pay | Admitting: Surgery

## 2016-09-07 DIAGNOSIS — Z853 Personal history of malignant neoplasm of breast: Secondary | ICD-10-CM

## 2016-10-12 ENCOUNTER — Ambulatory Visit
Admission: RE | Admit: 2016-10-12 | Discharge: 2016-10-12 | Disposition: A | Discharge status: Home / Self Care | Payer: Medicare Other | Source: Ambulatory Visit | Attending: Surgery | Admitting: Surgery

## 2016-10-12 DIAGNOSIS — Z853 Personal history of malignant neoplasm of breast: Secondary | ICD-10-CM

## 2017-01-14 ENCOUNTER — Encounter: Payer: Medicare Other | Attending: Surgery | Admitting: Surgery

## 2017-01-14 DIAGNOSIS — E11622 Type 2 diabetes mellitus with other skin ulcer: Secondary | ICD-10-CM | POA: Insufficient documentation

## 2017-01-14 DIAGNOSIS — D649 Anemia, unspecified: Secondary | ICD-10-CM | POA: Diagnosis not present

## 2017-01-14 DIAGNOSIS — L97212 Non-pressure chronic ulcer of right calf with fat layer exposed: Secondary | ICD-10-CM | POA: Insufficient documentation

## 2017-01-14 DIAGNOSIS — Z79899 Other long term (current) drug therapy: Secondary | ICD-10-CM | POA: Diagnosis not present

## 2017-01-14 DIAGNOSIS — Z7984 Long term (current) use of oral hypoglycemic drugs: Secondary | ICD-10-CM | POA: Diagnosis not present

## 2017-01-14 DIAGNOSIS — E039 Hypothyroidism, unspecified: Secondary | ICD-10-CM | POA: Diagnosis not present

## 2017-01-14 DIAGNOSIS — Z7982 Long term (current) use of aspirin: Secondary | ICD-10-CM | POA: Diagnosis not present

## 2017-01-14 DIAGNOSIS — E785 Hyperlipidemia, unspecified: Secondary | ICD-10-CM | POA: Insufficient documentation

## 2017-01-14 DIAGNOSIS — Z923 Personal history of irradiation: Secondary | ICD-10-CM | POA: Diagnosis not present

## 2017-01-14 DIAGNOSIS — Z853 Personal history of malignant neoplasm of breast: Secondary | ICD-10-CM | POA: Insufficient documentation

## 2017-01-14 DIAGNOSIS — J449 Chronic obstructive pulmonary disease, unspecified: Secondary | ICD-10-CM | POA: Insufficient documentation

## 2017-01-14 DIAGNOSIS — I890 Lymphedema, not elsewhere classified: Secondary | ICD-10-CM | POA: Insufficient documentation

## 2017-01-14 DIAGNOSIS — F17218 Nicotine dependence, cigarettes, with other nicotine-induced disorders: Secondary | ICD-10-CM | POA: Diagnosis not present

## 2017-01-15 NOTE — Progress Notes (Signed)
EARIE, MCMAINS ([id number]) Visit Report for 01/14/2017 Allergy List Details Patient Name: [name], [name]. Date of Service: 01/14/2017 9:45 AM Medical Record Number: [id number] Patient Account Number: [id number] Date of Birth/Sex: 1935-06-20 (82 y.o. Female) Treating RN: Carolyne Fiscal, [name] Primary Care Keliyah Spillman: Lynett Fish Other Clinician: Referring Brittiney Dicostanzo: Lynett Fish Treating Elijah Phommachanh/Extender: Frann Rider in Treatment: 0 Allergies Active Allergies sulfur Allergy Notes Electronic Signature(s) Signed: 01/14/2017 2:38:46 PM By: Alric Quan Entered By: Alric Quan on 01/14/2017 [id number] Luis, [name] -------------------------------------------------------------------------------- Arrival Information Details Patient Name: [name] Date of Service: 01/14/2017 9:45 AM Medical Record Number: [id number] Patient Account Number: [id number] Date of Birth/Sex: 1935-08-31 (82 y.o. Female) Treating RN: Carolyne Fiscal, [name] Primary Care Bailey Faiella: Lynett Fish Other Clinician: Referring Rickey Farrier: Lynett Fish Treating Ghali Morissette/Extender: Frann Rider in Treatment: 0 Visit Information Patient Arrived: Ambulatory Arrival Time: 09:42 Accompanied By: self Transfer Assistance: None Patient Identification Verified: Yes Secondary Verification Process Yes Completed: Patient Requires Transmission-Based No Precautions: Patient Has Alerts: Yes Patient Alerts: DM II Electronic Signature(s) Signed: 01/14/2017 2:38:46 PM By: Alric Quan Entered By: Alric Quan on 01/14/2017 [id number] [name] -------------------------------------------------------------------------------- Clinic Level of Care Assessment Details Patient Name: [name], [name]. Date of Service: 01/14/2017 9:45 AM Medical Record Number: [id number] Patient Account Number: [id number] Date of Birth/Sex: Mar 14, 1935 (82 y.o. Female) Treating RN: Carolyne Fiscal,  [name] Primary Care Zailen Albarran: Lynett Fish Other Clinician: Referring Meli Faley: Lynett Fish Treating Mandeep Kiser/Extender: Frann Rider in Treatment: 0 Clinic Level of Care Assessment Items TOOL 1 Quantity Score X - Use when EandM and Procedure is performed on INITIAL visit 1 0 ASSESSMENTS - Nursing Assessment / Reassessment X - General Physical Exam (combine w/ comprehensive assessment (listed just 1 20 below) when performed on new pt. evals) X - Comprehensive Assessment (HX, ROS, Risk Assessments, Wounds Hx, etc.) 1 25 ASSESSMENTS - Wound and Skin Assessment / Reassessment []  - Dermatologic / Skin Assessment (not related to wound area) 0 ASSESSMENTS - Ostomy and/or Continence Assessment and Care []  - Incontinence Assessment and Management 0 []  - Ostomy Care Assessment and Management (repouching, etc.) 0 PROCESS - Coordination of Care []  - Simple Patient / Family Education for ongoing care 0 X - Complex (extensive) Patient / Family Education for ongoing care 1 20 X - Staff obtains Programmer, systems, Records, Test Results / Process Orders 1 10 []  - Staff telephones HHA, Nursing Homes / Clarify orders / etc 0 []  - Routine Transfer to another Facility (non-emergent condition) 0 []  - Routine Hospital Admission (non-emergent condition) 0 X - New Admissions / Biomedical engineer / Ordering NPWT, Apligraf, etc. 1 15 []  - Emergency Hospital Admission (emergent condition) 0 PROCESS - Special Needs []  - Pediatric / Minor Patient Management 0 []  - Isolation Patient Management 0 [name], [name] ([id number]) []  - Hearing / Language / Visual special needs 0 []  - Assessment of Community assistance (transportation, D/C planning, etc.) 0 []  - Additional assistance / Altered mentation 0 []  - Support Surface(s) Assessment (bed, cushion, seat, etc.) 0 INTERVENTIONS - Miscellaneous []  - External ear exam 0 []  - Patient Transfer (multiple staff / Civil Service fast streamer / Similar devices) 0 []  - Simple  Staple / Suture removal (25 or less) 0 []  - Complex Staple / Suture removal (26 or more) 0 []  - Hypo/Hyperglycemic Management (do not check if billed separately) 0 X - Ankle / Brachial Index (ABI) - do not check if billed separately 1 15 Has the patient been seen at  the hospital within the last three years: Yes Total Score: 105 Level Of Care: New/Established - Level 3 Electronic Signature(s) Signed: 01/14/2017 2:38:46 PM By: Alric Quan Entered By: Alric Quan on 01/14/2017 [id number] [name], [name] -------------------------------------------------------------------------------- Encounter Discharge Information Details Patient Name: [name], [name] Date of Service: 01/14/2017 9:45 AM Medical Record Number: [id number] Patient Account Number: [id number] Date of Birth/Sex: [birth year] (82 y.o. Female) Treating RN: Carolyne Fiscal, [name] Primary Care Teshara Moree: Lynett Fish Other Clinician: Referring Tafari Humiston: Lynett Fish Treating Phares Zaccone/Extender: Frann Rider in Treatment: 0 Encounter Discharge Information Items Discharge Pain Level: 0 Discharge Condition: Stable Ambulatory Status: Ambulatory Discharge Destination: Home Transportation: Private Auto Accompanied By: self Schedule Follow-up Appointment: Yes Medication Reconciliation completed and provided to Patient/Care No Marquitta Persichetti: Provided on Clinical Summary of Care: 01/14/2017 Form Type Recipient Paper Patient AM Electronic Signature(s) Signed: 01/14/2017 10:38:07 AM By: Ruthine Dose Entered By: Ruthine Dose on 01/14/2017 [id number] [name] -------------------------------------------------------------------------------- Lower Extremity Assessment Details Patient Name: [name], [name] Date of Service: 01/14/2017 9:45 AM Medical Record Number: [id number] Patient Account Number: [id number] Date of Birth/Sex: 03/30/1935 (82 y.o. Female) Treating RN: Ahmed Prima Primary Care  Orma Cheetham: Lynett Fish Other Clinician: Referring Karlita Lichtman: Lynett Fish Treating Kamyrah Feeser/Extender: Frann Rider in Treatment: 0 Vascular Assessment Pulses: Dorsalis Pedis Palpable: [Left:Yes] [Right:Yes] Posterior Tibial Extremity colors, hair growth, and conditions: Extremity Color: [Left:Mottled] [Right:Mottled] Hair Growth on Extremity: [Left:No] [Right:No] Temperature of Extremity: [Left:Warm] [Right:Warm] Capillary Refill: [Left:< 3 seconds] [Right:< 3 seconds] Blood Pressure: Brachial: [Left:134] [Right:134] Dorsalis Pedis: 152 [Left:Dorsalis Pedis: [id number] Ankle: Posterior Tibial: 150 [Left:Posterior Tibial: 160 1.13] [Right:1.19] Toe Nail Assessment Left: Right: Thick: No No Discolored: No No Deformed: No No Improper Length and Hygiene: No No Electronic Signature(s) Signed: 01/14/2017 2:38:46 PM By: Alric Quan Entered By: Alric Quan on 01/14/2017 [id number] Spires, Ginni Jenetta [name] ([id number]) -------------------------------------------------------------------------------- Multi Wound Chart Details Patient Name: [name], [name]. Date of Service: 01/14/2017 9:45 AM Medical Record Number: [id number] Patient Account Number: [id number] Date of Birth/Sex: 11-20-1935 (82 y.o. Female) Treating RN: Carolyne Fiscal, [name] Primary Care Flo Berroa: Lynett Fish Other Clinician: Referring Jaquila Santelli: Lynett Fish Treating Emmalyn Hinson/Extender: Frann Rider in Treatment: 0 Vital Signs Height(in): 64 Pulse(bpm): 90 Weight(lbs): 142.7 Blood Pressure 154/59 (mmHg): Body Mass Index(BMI): 24 Temperature(F): 97.6 Respiratory Rate 18 (breaths/min): Photos: [1:No Photos] [N/A:N/A] Wound Location: [1:Right Lower Leg - Posterior] [N/A:N/A] Wounding Event: [1:Trauma] [N/A:N/A] Primary Etiology: [1:Diabetic Wound/Ulcer of N/A the Lower Extremity] Secondary Etiology: [1:Trauma, Other] [N/A:N/A] Comorbid History: [1:Cataracts, Anemia, Chronic Obstructive  Pulmonary Disease (COPD), Type II Diabetes, Received Radiation] [N/A:N/A] Date Acquired: [1:12/17/2016] [N/A:N/A] Weeks of Treatment: [1:0] [N/A:N/A] Wound Status: [1:Open] [N/A:N/A] Measurements L x W x D 1.6x2x0.1 [N/A:N/A] (cm) Area (cm) : [1:2.513] [N/A:N/A] Volume (cm) : [1:0.251] [N/A:N/A] Classification: [1:Grade 2] [N/A:N/A] Exudate Amount: [1:Large] [N/A:N/A] Exudate Type: [1:Serosanguineous] [N/A:N/A] Exudate Color: [1:red, brown] [N/A:N/A] Wound Margin: [1:Distinct, outline attached N/A] Granulation Amount: [1:None Present (0%)] [N/A:N/A] Necrotic Amount: [1:Large (67-100%)] [N/A:N/A] Necrotic Tissue: [1:Eschar, Adherent Slough N/A] Exposed Structures: [1:Fascia: No Fat Layer (Subcutaneous Tissue) Exposed: No] [N/A:N/A] Tendon: No Muscle: No Joint: No Bone: No Limited to Skin Breakdown Epithelialization: None N/A N/A Debridement: Debridement [id number] N/A N/A [id number]) Pre-procedure 10:24 N/A N/A Verification/Time Out Taken: Pain Control: Lidocaine 4% Topical N/A N/A Solution Tissue Debrided: Fibrin/Slough, Exudates, N/A N/A Subcutaneous Level: Skin/Subcutaneous N/A N/A Tissue Debridement Area (sq 3.2 N/A N/A cm): Instrument: Forceps N/A N/A Bleeding: Minimum N/A N/A Hemostasis Achieved: Pressure N/A N/A Procedural Pain: 0 N/A N/A  Post Procedural Pain: 0 N/A N/A Debridement Treatment Procedure was tolerated N/A N/A Response: well Post Debridement 1.6x2x0.1 N/A N/A Measurements L x W x D (cm) Post Debridement 0.251 N/A N/A Volume: (cm) Periwound Skin Texture: No Abnormalities Noted N/A N/A Periwound Skin No Abnormalities Noted N/A N/A Moisture: Periwound Skin Color: Erythema: Yes N/A N/A Erythema Location: Circumferential N/A N/A Temperature: No Abnormality N/A N/A Tenderness on Yes N/A N/A Palpation: Wound Preparation: Ulcer Cleansing: N/A N/A Rinsed/Irrigated with Saline Topical Anesthetic Applied: Other: lidocaine 4% Procedures Performed:  Debridement N/A N/A [name], [name] Treatment Notes Wound #1 (Right, Posterior Lower Leg) 1. Cleansed with: Clean wound with Normal Saline 2. Anesthetic Topical Lidocaine 4% cream to wound bed prior to debridement 3. Peri-wound Care: Skin Prep 4. Dressing Applied: Aquacel Ag 5. Secondary Dressing Applied Bordered Foam Dressing Electronic Signature(s) Signed: 01/14/2017 10:41:24 AM By: Christin Fudge MD, FACS Entered By: Christin Fudge on 01/14/2017 0000000 Durney, [name] -------------------------------------------------------------------------------- Multi-Disciplinary Care Plan Details Patient Name: DOMINO, FONTANA. Date of Service: 01/14/2017 9:45 AM Medical Record Number: [id number] Patient Account Number: [id number] Date of Birth/Sex: Oct 15, 1935 (82 y.o. Female) Treating RN: Carolyne Fiscal, [name] Primary Care Tanyah Debruyne: Lynett Fish Other Clinician: Referring Dezman Granda: Lynett Fish Treating Cuahutemoc Attar/Extender: Frann Rider in Treatment: 0 Active Inactive ` Nutrition Nursing Diagnoses: Imbalanced nutrition Impaired glucose control: actual or potential Potential for alteratiion in Nutrition/Potential for imbalanced nutrition Goals: Patient/caregiver agrees to and verbalizes understanding of need to use nutritional supplements and/or vitamins as prescribed Date Initiated: 01/14/2017 Target Resolution Date: 03/20/2017 Goal Status: Active Interventions: Assess patient nutrition upon admission and as needed per policy Provide education on elevated blood sugars and impact on wound healing Notes: ` Orientation to the Wound Care Program Nursing Diagnoses: Knowledge deficit related to the wound healing center program Goals: Patient/caregiver will verbalize understanding of the [facility] Date Initiated: 01/14/2017 Target Resolution Date: 02/20/2017 Goal Status: Active Interventions: Provide education on orientation to the wound  center Notes: ` Pain, Acute or Chronic [name], [name] Nursing Diagnoses: Pain, acute or chronic: actual or potential Potential alteration in comfort, pain Goals: Patient will verbalize adequate pain control and receive pain control interventions during procedures as needed Date Initiated: 01/14/2017 Target Resolution Date: 03/20/2017 Goal Status: Active Interventions: Assess comfort goal upon admission Complete pain assessment as per visit requirements Notes: ` Wound/Skin Impairment Nursing Diagnoses: Impaired tissue integrity Knowledge deficit related to smoking impact on wound healing Knowledge deficit related to ulceration/compromised skin integrity Goals: Ulcer/skin breakdown will have a volume reduction of 80% by week 12 Date Initiated: 01/14/2017 Target Resolution Date: 03/20/2017 Goal Status: Active Interventions: Assess patient/caregiver ability to perform ulcer/skin care regimen upon admission and as needed Assess ulceration(s) every visit Provide education on smoking Notes: Electronic Signature(s) Signed: 01/14/2017 2:38:46 PM By: Alric Quan Entered By: Alric Quan on 01/14/2017 [id number] [name], [name] -------------------------------------------------------------------------------- Pain Assessment Details Patient Name: [name]. Date of Service: 01/14/2017 9:45 AM Medical Record Number: [id number] Patient Account Number: [id number] Date of Birth/Sex: Nov 06, 1935 (82 y.o. Female) Treating RN: Carolyne Fiscal, [name] Primary Care Niaja Stickley: Lynett Fish Other Clinician: Referring Draylen Lobue: Lynett Fish Treating Ezekeil Bethel/Extender: Frann Rider in Treatment: 0 Active Problems Location of Pain Severity and Description of Pain Patient Has Paino No Site Locations With Dressing Change: No Pain Management and Medication Current Pain Management: Electronic Signature(s) Signed: 01/14/2017 2:38:46 PM By: Alric Quan Entered By: Alric Quan on 01/14/2017 [id number] [name], [name] ([id number]) --------------------------------------------------------------------------------  Patient/Caregiver Education Details Patient Name: [name], [name]. Date of Service: 01/14/2017 9:45 AM Medical Record Number: [id number] Patient Account Number: [id number] Date of Birth/Gender: 03/23/1935 (82 y.o. Female) Treating RN: Ahmed Prima Primary Care Physician: Lynett Fish Other Clinician: Referring Physician: Lynett Fish Treating Physician/Extender: Frann Rider in Treatment: 0 Education Assessment Education Provided To: Patient Education Topics Provided Elevated Blood Sugar/ Impact on Healing: Handouts: Elevated Blood Sugars: How Do They Affect Wound Healing Methods: Explain/Verbal Responses: State content correctly Smoking and Wound Healing: Handouts: Smoking and Wound Healing Methods: Explain/Verbal Responses: State content correctly Welcome To [facility]: Handouts: Welcome To [facility] Methods: Explain/Verbal Responses: State content correctly Wound/Skin Impairment: Handouts: Other: change dressing as ordered Methods: Demonstration, Explain/Verbal Responses: State content correctly Electronic Signature(s) Signed: 01/14/2017 2:38:46 PM By: Alric Quan Entered By: Alric Quan on 01/14/2017 [id number] [place], [place]. ([id number]) -------------------------------------------------------------------------------- Wound Assessment Details Patient Name: Llewellyn, Aleighna O. Date of Service: 01/14/2017 9:45 AM Medical Record Number: [id number] Patient Account Number: [id number] Date of Birth/Sex: [birth year] (82 y.o. Female) Treating RN: Carolyne Fiscal, [name] Primary Care Aniyah Nobis: Lynett Fish Other Clinician: Referring Kit Mollett: Lynett Fish Treating Uva Runkel/Extender: Frann Rider in Treatment: 0 Wound Status Wound Number: 1 Primary Diabetic  Wound/Ulcer of the Lower Etiology: Extremity Wound Location: Right Lower Leg - Posterior Secondary Trauma, Other Wounding Event: Trauma Etiology: Date Acquired: 12/17/2016 Wound Open Weeks Of Treatment: 0 Status: Clustered Wound: No Comorbid Cataracts, Anemia, Chronic History: Obstructive Pulmonary Disease (COPD), Type II Diabetes, Received Radiation Photos Wound Measurements Length: (cm) 1.6 Width: (cm) 2 Depth: (cm) 0.1 Area: (cm) 2.513 Volume: (cm) 0.251 % Reduction in Area: 0% % Reduction in Volume: 0% Epithelialization: None Tunneling: No Undermining: No Wound Description Classification: Grade 2 Foul Odor Aft Wound Margin: Distinct, outline attached Slough/Fibrin Exudate Amount: Large Exudate Type: Serosanguineous Exudate Color: red, brown er Cleansing: No o No Wound Bed Granulation Amount: None Present (0%) Exposed Structure Necrotic Amount: Large (67-100%) Fascia Exposed: No Aldaz, Felix O. ([id number]) Necrotic Quality: Eschar, Adherent Slough Fat Layer (Subcutaneous Tissue) Exposed: No Tendon Exposed: No Muscle Exposed: No Joint Exposed: No Bone Exposed: No Limited to Skin Breakdown Periwound Skin Texture Texture Color No Abnormalities Noted: No No Abnormalities Noted: No Erythema: Yes Moisture Erythema Location: Circumferential No Abnormalities Noted: No Temperature / Pain Temperature: No Abnormality Tenderness on Palpation: Yes Wound Preparation Ulcer Cleansing: Rinsed/Irrigated with Saline Topical Anesthetic Applied: Other: lidocaine 4%, Assessment Notes This picture is the right patient but has the wrong initials. Treatment Notes Wound #1 (Right, Posterior Lower Leg) 1. Cleansed with: Clean wound with Normal Saline 2. Anesthetic Topical Lidocaine 4% cream to wound bed prior to debridement 3. Peri-wound Care: Skin Prep 4. Dressing Applied: Aquacel Ag 5. Secondary Dressing Applied Bordered Foam Dressing Electronic  Signature(s) Signed: 01/14/2017 2:38:46 PM By: Alric Quan Entered By: Alric Quan on 01/14/2017 [id number] [place], Abagayle Jenetta [name] ([id number]) -------------------------------------------------------------------------------- Vitals Details Patient Name: Tall, [name] Date of Service: 01/14/2017 9:45 AM Medical Record Number: [id number] Patient Account Number: [id number] Date of Birth/Sex: 1935-11-07 (82 y.o. Female) Treating RN: Carolyne Fiscal, [name] Primary Care Susanne Baumgarner: Lynett Fish Other Clinician: Referring Tomie Spizzirri: Lynett Fish Treating Bradey Luzier/Extender: Frann Rider in Treatment: 0 Vital Signs Time Taken: 09:44 Temperature (F): 97.6 Height (in): 64 Pulse (bpm): 90 Source: Stated Respiratory Rate (breaths/min): 18 Weight (lbs): 142.7 Blood Pressure (mmHg): 154/59 Source: Measured Reference Range: 80 - 120 mg / dl Body Mass Index (BMI): 24.5 Electronic Signature(s) Signed: 01/14/2017 2:38:46  PM By: Alric Quan Entered By: Alric Quan on 01/14/2017 09:44:40

## 2017-01-15 NOTE — Progress Notes (Signed)
INETTE, HARKLESS ([id number]) Visit Report for 01/14/2017 Chief Complaint Document Details Patient Name: [name], [name]. Date of Service: 01/14/2017 9:45 AM Medical Record Number: [id number] Patient Account Number: [id number] Date of Birth/Sex: Sep 24, 1935 (81 y.o. Female) Treating RN: Carolyne Fiscal, [name] Primary Care Provider: Lynett Fish Other Clinician: Referring Provider: Lynett Fish Treating Provider/Extender: Frann Rider in Treatment: 0 Information Obtained from: Patient Chief Complaint Patients presents for treatment of an open diabetic ulcer to the right lower extremity which is had for about a month Electronic Signature(s) Signed: 01/14/2017 10:42:14 AM By: Christin Fudge MD, FACS Entered By: Christin Fudge on 01/14/2017 [id number] Hellenbrand, [name] -------------------------------------------------------------------------------- Debridement Details Patient Name: Guedea, [name]. Date of Service: 01/14/2017 9:45 AM Medical Record Number: [id number] Patient Account Number: [id number] Date of Birth/Sex: 07/29/1935 (81 y.o. Female) Treating RN: Carolyne Fiscal, [name] Primary Care Provider: Lynett Fish Other Clinician: Referring Provider: Lynett Fish Treating Provider/Extender: Frann Rider in Treatment: 0 Debridement Performed for Wound #1 Right,Posterior Lower Leg Assessment: Performed By: Physician Christin Fudge, MD Debridement: Debridement Pre-procedure Yes - 10:24 Verification/Time Out Taken: Start Time: 10:25 Pain Control: Lidocaine 4% Topical Solution Level: Skin/Subcutaneous Tissue Total Area Debrided (L x 1.6 (cm) x 2 (cm) = 3.2 (cm) W): Tissue and other Viable, Non-Viable, Exudate, Fibrin/Slough, Subcutaneous material debrided: Instrument: Forceps Bleeding: Minimum Hemostasis Achieved: Pressure End Time: 10:27 Procedural Pain: 0 Post Procedural Pain: 0 Response to Treatment: Procedure was tolerated well Post Debridement  Measurements of Total Wound Length: (cm) 1.6 Width: (cm) 2 Depth: (cm) 0.1 Volume: (cm) 0.251 Character of Wound/Ulcer Post Requires Further Debridement Debridement: Severity of Tissue Post Debridement: Fat layer exposed Post Procedure Diagnosis Same as Pre-procedure Electronic Signature(s) Signed: 01/14/2017 10:41:32 AM By: Christin Fudge MD, FACS Signed: 01/14/2017 2:38:46 PM By: Alric Quan Entered By: Christin Fudge on 01/14/2017 [id number] Krock, [name] [place], [name] -------------------------------------------------------------------------------- HPI Details Patient Name: Shirer, [name] Date of Service: 01/14/2017 9:45 AM Medical Record Number: [id number] Patient Account Number: [id number] Date of Birth/Sex: 09-16-1935 (81 y.o. Female) Treating RN: Carolyne Fiscal, [name] Primary Care Provider: Lynett Fish Other Clinician: Referring Provider: Lynett Fish Treating Provider/Extender: Frann Rider in Treatment: 0 History of Present Illness Location: right lower posterior calf Quality: Patient reports experiencing a sharp pain to affected area(s). Severity: Patient states wound are getting worse. Duration: Patient has had the wound for < 4 weeks prior to presenting for treatment Timing: Pain in wound is Intermittent (comes and goes Context: The wound occurred when the patient hit the back of her leg against a blunt object at home which opened out a wound Modifying Factors: Other treatment(s) tried include:Keflex and doxycycline by mouth Associated Signs and Symptoms: Patient reports having increase swelling. HPI Description: 82 year old patient who is known to have diabetes mellitus and tobacco abuse has had a injury to the right posterior calf about a month ago. He has been treated with local care and 2 courses of antibiotics which include Keflex and doxycycline which she has completed. Smokes about a pack and a half of cigarettes a day  and has a past medical history of COPD, diabetes mellitus, hyperlipidemia,psoriasis and hypothyroidism Electronic Signature(s) Signed: 01/14/2017 11:09:08 AM By: Christin Fudge MD, FACS Entered By: Christin Fudge on 01/14/2017 [id number] [place], [name] -------------------------------------------------------------------------------- Physical Exam Details Patient Name: Meunier, [name] Date of Service: 01/14/2017 9:45 AM Medical Record Number: [id number] Patient Account Number: [id number] Date of Birth/Sex: [birth year] (81 y.o. Female) Treating RN: Carolyne Fiscal, [name]  Primary Care Provider: Lynett Fish Other Clinician: Referring Provider: Lynett Fish Treating Provider/Extender: Frann Rider in Treatment: 0 Constitutional . Pulse regular. Respirations normal and unlabored. Afebrile. . Eyes Nonicteric. Reactive to light. Ears, Nose, Mouth, and Throat Lips, teeth, and gums WNL.Marland Kitchen Moist mucosa without lesions. Neck supple and nontender. No palpable supraclavicular or cervical adenopathy. Normal sized without goiter. Respiratory WNL. No retractions.. Breath sounds WNL, No rubs, rales, rhonchi, or wheeze.. Cardiovascular . Pedal Pulses WNL. ABI on the right is 1.19 on the left is 1.13. the patient has lymphedema and has stigmata of varicose veins. Gastrointestinal (GI) Abdomen without masses or tenderness.. No liver or spleen enlargement or tenderness.. Lymphatic No adneopathy. No adenopathy. No adenopathy. Musculoskeletal Adexa without tenderness or enlargement.. Digits and nails w/o clubbing, cyanosis, infection, petechiae, ischemia, or inflammatory conditions.. Integumentary (Hair, Skin) No suspicious lesions. No crepitus or fluctuance. No peri-wound warmth or erythema. No masses.Marland Kitchen Psychiatric Judgement and insight Intact.. No evidence of depression, anxiety, or agitation.. Notes the patient has lymphedema and is also some dilated varicose veins but has no  evidence of stasis ulceration. The ulcer is covered with eschar and subcutaneous and his debris and are sharply removed this with a forcep and moist saline gauze. Minimal bleeding controlled with pressure Electronic Signature(s) Signed: 01/14/2017 11:10:37 AM By: Christin Fudge MD, FACS Entered By: Christin Fudge on 01/14/2017 [id number] Storr, [name] -------------------------------------------------------------------------------- Physician Orders Details Patient Name: [name] Date of Service: 01/14/2017 9:45 AM Medical Record Number: [id number] Patient Account Number: [id number] Date of Birth/Sex: 12-Feb-1935 (81 y.o. Female) Treating RN: Carolyne Fiscal, [name] Primary Care Provider: Lynett Fish Other Clinician: Referring Provider: Lynett Fish Treating Provider/Extender: Frann Rider in Treatment: 0 Verbal / Phone Orders: Yes Clinician: Pinkerton, [name] Read Back and Verified: Yes Diagnosis Coding Wound Cleansing Wound #1 Right,Posterior Lower Leg o Clean wound with Normal Saline. o Cleanse wound with mild soap and water o May Shower, gently pat wound dry prior to applying new dressing. Anesthetic Wound #1 Right,Posterior Lower Leg o Topical Lidocaine 4% cream applied to wound bed prior to debridement Skin Barriers/Peri-Wound Care Wound #1 Right,Posterior Lower Leg o Skin Prep Primary Wound Dressing Wound #1 Right,Posterior Lower Leg o Aquacel Ag Secondary Dressing Wound #1 Right,Posterior Lower Leg o Boardered Foam Dressing Dressing Change Frequency Wound #1 Right,Posterior Lower Leg o Change dressing every other day. Follow-up Appointments Wound #1 Right,Posterior Lower Leg o Return Appointment in 1 week. Edema Control Wound #1 Right,Posterior Lower Leg o Elevate legs to the level of the heart and pump ankles as often as possible Hoobler, Vassie O. ([id number]) Additional Orders / Instructions Wound #1 Right,Posterior Lower  Leg o Stop Smoking o Increase protein intake. Medications-please add to medication list. Wound #1 Right,Posterior Lower Leg o Other: - vitamin c, zinc, MVI Electronic Signature(s) Signed: 01/14/2017 2:38:46 PM By: Alric Quan Signed: 01/14/2017 3:11:09 PM By: Christin Fudge MD, FACS Entered By: Alric Quan on 01/14/2017 [id number] Glaab, [name] -------------------------------------------------------------------------------- Problem List Details Patient Name: Nahar, [name] Date of Service: 01/14/2017 9:45 AM Medical Record Number: [id number] Patient Account Number: [id number] Date of Birth/Sex: Aug 03, 1935 (81 y.o. 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 Date of Service: 01/14/2017 9:45 AM Medical Record Number: [id number] Patient Account Number: [id number] Date of Birth/Sex: 1935-01-18 (81 y.o. Female) Treating RN: Carolyne Fiscal, [name] Primary Care Provider: Lynett Fish Other Clinician: Referring Provider: Lynett Fish Treating Provider/Extender: Frann Rider in Treatment: 0 Subjective Chief  Complaint Information obtained from Patient Patients presents for treatment of an open diabetic ulcer to the right lower extremity which is had for about a month History of Present Illness (HPI) The following HPI elements were documented for the patient's wound: Location: right lower posterior calf Quality: Patient reports experiencing a sharp pain to affected area(s). Severity: Patient states wound are getting worse. Duration: Patient has had the wound for < 4 weeks prior to presenting for treatment Timing: Pain in wound is Intermittent (comes and goes Context: The wound occurred when the patient hit the back of her leg against a blunt object at home which opened out a wound Modifying Factors: Other treatment(s) tried include:Keflex and doxycycline by mouth Associated Signs and Symptoms: Patient reports having increase swelling. 82 year old patient who is known to have diabetes mellitus and tobacco abuse has had a injury to the right posterior calf about a month ago. He has been treated with local care and 2 courses of antibiotics which include Keflex and doxycycline which she has completed. Smokes about a pack and a half of cigarettes a day and has a past medical history of COPD, diabetes mellitus, hyperlipidemia,psoriasis and hypothyroidism Wound History Patient presents with 1 open wound that has been present for approximately 1 month. Patient has been treating wound in the following manner: nothing. Laboratory tests have not been performed in the last month. Patient reportedly has not tested positive for an antibiotic resistant organism. Patient reportedly has not tested positive for osteomyelitis. Patient reportedly has not had testing performed to evaluate circulation in the legs. Patient experiences the following problems associated with their wounds: infection, swelling. Patient History Information obtained from Patient. Allergies sulfur Kohen, Alizae Jenetta [name] ([id number]) Family  History Heart Disease - Mother, No family history of Cancer, Diabetes, Hereditary Spherocytosis, Hypertension, Kidney Disease, Lung Disease, Seizures, Stroke, Thyroid Problems, Tuberculosis. Social History Current every day smoker - 1 1.5 a day, Marital Status - Married, Alcohol Use - Never, Drug Use - No History, Caffeine Use - Daily. Medical History Eyes Patient has history of Cataracts Hematologic/Lymphatic Patient has history of Anemia Respiratory Patient has history of Chronic Obstructive Pulmonary Disease (COPD) Endocrine Patient has history of Type II Diabetes Oncologic Patient has history of Received Radiation - 2015 Patient is treated with Oral Agents. Blood sugar is not tested. Review of Systems (ROS) Constitutional Symptoms (General Health) The patient has no complaints or symptoms. Eyes Complains or has symptoms of Glasses / Contacts. Hematologic/Lymphatic hyperlipideimia Cardiovascular The patient has no complaints or symptoms. Gastrointestinal gastritis Endocrine Complains or has symptoms of Thyroid disease. Genitourinary The patient has no complaints or symptoms. Immunological psoriasis Integumentary (Skin) psoriasis Musculoskeletal The patient has no complaints or symptoms. Neurologic The patient has no complaints or symptoms. Oncologic breast cancer Psychiatric The patient has no complaints or symptoms. [name], [name] Medications diphenhydramine 25 mg-acetaminophen 500 mg tablet oral tablet oral lorazepam 0.5 mg tablet  oral tablet oral clonazepam 0.5 mg tablet oral tablet oral metformin 1,000 mg tablet oral tablet oral glipizide ER 10 mg tablet, extended release 24 hr oral tablet extended release 24hr oral simvastatin 40 mg tablet oral tablet oral Otezla 30 mg tablet oral tablet oral letrozole 2.5 mg tablet oral tablet oral ferrous sulfate 325 mg (65 mg iron) tablet oral tablet oral aspirin 81 mg tablet,delayed release oral  tablet,delayed release (DR/EC) oral omeprazole 20 mg capsule,delayed release oral capsule,delayed release(DR/EC) oral levothyroxine 137 mcg tablet oral tablet oral Objective Constitutional Pulse regular. Respirations normal and unlabored. Afebrile. Vitals Time Taken: 9:44 AM, Height: 64 in, Source: Stated, Weight: 142.7 lbs, Source: Measured, BMI: 24.5, Temperature: 97.6 F, Pulse: 90 bpm, Respiratory Rate: 18 breaths/min, Blood Pressure: 154/59 mmHg. Eyes Nonicteric. Reactive to light. Ears, Nose, Mouth, and Throat Lips, teeth, and gums WNL.Marland Kitchen Moist mucosa without lesions. Neck supple and nontender. No palpable supraclavicular or cervical adenopathy. Normal sized without goiter. Respiratory WNL. No retractions.. Breath sounds WNL, No rubs, rales, rhonchi, or wheeze.. Cardiovascular Pedal Pulses WNL. ABI on the right is 1.19 on the left is 1.13. the patient has lymphedema and has stigmata of varicose veins. Gastrointestinal (GI) Vigen, Tanishka O. ([id number]) Abdomen without masses or tenderness.. No liver or spleen enlargement or tenderness.. Lymphatic No adneopathy. No adenopathy. No adenopathy. Musculoskeletal Adexa without tenderness or enlargement.. Digits and nails w/o clubbing, cyanosis, infection, petechiae, ischemia, or inflammatory conditions.Marland Kitchen Psychiatric Judgement and insight Intact.. No evidence of depression, anxiety, or agitation.. General Notes: the patient has lymphedema and is also some dilated varicose veins but has no evidence of stasis ulceration. The ulcer is covered with eschar and subcutaneous and his debris and are sharply removed this with a forcep and moist saline gauze. Minimal bleeding controlled with pressure Integumentary (Hair, Skin) No suspicious lesions. No crepitus or fluctuance. No peri-wound warmth or erythema. No masses.. Wound #1 status is Open. Original cause of wound was Trauma. The wound is located on the Right,Posterior Lower Leg. The wound  measures 1.6cm length x 2cm width x 0.1cm depth; 2.513cm^2 area and 0.251cm^3 volume. The wound is limited to skin breakdown. There is no tunneling or undermining noted. There is a large amount of serosanguineous drainage noted. The wound margin is distinct with the outline attached to the wound base. There is no granulation within the wound bed. There is a large (67- 100%) amount of necrotic tissue within the wound bed including Eschar and Adherent Slough. The periwound skin appearance exhibited: Erythema. The surrounding wound skin color is noted with erythema which is circumferential. Periwound temperature was noted as No Abnormality. The periwound has tenderness on palpation. General Notes: This picture is the right patient but has the wrong initials. Assessment Active Problems ICD-10 E11.622 - Type 2 diabetes mellitus with other skin ulcer L97.212 - Non-pressure chronic ulcer of right calf with fat layer exposed I89.0 - Lymphedema, not elsewhere classified F17.218 - Nicotine dependence, cigarettes, with other nicotine-induced disorders [name], [name] 81 year old diabetic who was had nicotine addictions for several years also has lymphedema which has never been worked up. The present she has clinical signs of varicose veins but we will not get the workup done right away as per her request. After review recommended: 1. Aquacel Ag with a bordered foam to be changed every other day 2. Elevation and exercise 3. Good control of her diabetes mellitus 4. Adequate protein, vitamin A, vitamin C and zinc 5. I have spent 3 minutes discussing with her the need  to completely give up smoking and I have discussed the risk benefits and alternatives She has had all questions answered and said she will be compliant Procedures Wound #1 Wound #1 is a Diabetic Wound/Ulcer of the Lower Extremity located on the Right,Posterior Lower Leg . There was a Skin/Subcutaneous Tissue Debridement  [id number]) debridement with total area of 3.2 sq cm performed by Christin Fudge, MD. with the following instrument(s): Forceps to remove Viable and Non- Viable tissue/material including Exudate, Fibrin/Slough, and Subcutaneous after achieving pain control using Lidocaine 4% Topical Solution. A time out was conducted at 10:24, prior to the start of the procedure. A Minimum amount of bleeding was controlled with Pressure. The procedure was tolerated well with a pain level of 0 throughout and a pain level of 0 following the procedure. Post Debridement Measurements: 1.6cm length x 2cm width x 0.1cm depth; 0.251cm^3 volume. Character of Wound/Ulcer Post Debridement requires further debridement. Severity of Tissue Post Debridement is: Fat layer exposed. Post procedure Diagnosis Wound #1: Same as Pre-Procedure Plan Wound Cleansing: Wound #1 Right,Posterior Lower Leg: Clean wound with Normal Saline. Cleanse wound with mild soap and water May Shower, gently pat wound dry prior to applying new dressing. Anesthetic: Wound #1 Right,Posterior Lower Leg: Topical Lidocaine 4% cream applied to wound bed prior to debridement Skin Barriers/Peri-Wound Care: Wound #1 Right,Posterior Lower Leg: Skin Prep Primary Wound Dressing: NOBIA, WOLTHUIS ([id number]) Wound #1 Right,Posterior Lower Leg: Aquacel Ag Secondary Dressing: Wound #1 Right,Posterior Lower Leg: Boardered Foam Dressing Dressing Change Frequency: Wound #1 Right,Posterior Lower Leg: Change dressing every other day. Follow-up Appointments: Wound #1 Right,Posterior Lower Leg: Return Appointment in 1 week. Edema Control: Wound #1 Right,Posterior Lower Leg: Elevate legs to the level of the heart and pump ankles as often as possible Additional Orders / Instructions: Wound #1 Right,Posterior Lower Leg: Stop Smoking Increase protein intake. Medications-please add to medication list.: Wound #1 Right,Posterior Lower Leg: Other: - vitamin c,  zinc, MVI 82 year old diabetic who was had nicotine addictions for several years also has lymphedema which has never been worked up. The present she has clinical signs of varicose veins but we will not get the workup done right away as per her request. After review recommended: 1. Aquacel Ag with a bordered foam to be changed every other day 2. Elevation and exercise 3. Good control of her diabetes mellitus 4. Adequate protein, vitamin A, vitamin C and zinc 5. I have spent 3 minutes discussing with her the need to completely give up smoking and I have discussed the risk benefits and alternatives She has had all questions answered and said she will be compliant Electronic Signature(s) Signed: 01/14/2017 11:12:52 AM By: Christin Fudge MD, FACS Entered By: Christin Fudge on 01/14/2017 [id number] Shankle, Marie Jenetta [name] ([id number]) -------------------------------------------------------------------------------- ROS/PFSH Details Patient Name: [name] Date of Service: 01/14/2017 9:45 AM Medical Record Number: [id number] Patient Account Number: [id number] Date of Birth/Sex: 20-Jan-1935 (81 y.o. Female) Treating RN: Carolyne Fiscal, [name] Primary Care Provider: Lynett Fish Other Clinician: Referring Provider: Lynett Fish Treating Provider/Extender: Frann Rider in Treatment: 0 Information Obtained From Patient Wound History Do you currently have one or more open woundso Yes How many open wounds do you currently haveo 1 Approximately how long have you had your woundso 1 month How have you been treating your wound(s) until nowo nothing Has your wound(s) ever healed and then re-openedo No Have you had any lab work done in the past montho No Have you tested positive for an antibiotic resistant  organism (MRSA, VRE)o No Have you tested positive for osteomyelitis (bone infection)o No Have you had any tests for circulation on your legso No Have you had other problems associated with your  woundso Infection, Swelling Eyes Complaints and Symptoms: Positive for: Glasses / Contacts Medical History: Positive for: Cataracts Endocrine Complaints and Symptoms: Positive for: Thyroid disease Medical History: Positive for: Type II Diabetes Time with diabetes: 15 years Treated with: Oral agents Blood sugar tested every day: No Constitutional Symptoms (General Health) Complaints and Symptoms: No Complaints or Symptoms Hematologic/Lymphatic STELA, DUNSFORD ([id number]) Complaints and Symptoms: Review of System Notes: hyperlipideimia Medical History: Positive for: Anemia Respiratory Medical History: Positive for: Chronic Obstructive Pulmonary Disease (COPD) Cardiovascular Complaints and Symptoms: No Complaints or Symptoms Gastrointestinal Complaints and Symptoms: Review of System Notes: gastritis Genitourinary Complaints and Symptoms: No Complaints or Symptoms Immunological Complaints and Symptoms: Review of System Notes: psoriasis Integumentary (Skin) Complaints and Symptoms: Review of System Notes: psoriasis Musculoskeletal Complaints and Symptoms: No Complaints or Symptoms Neurologic Complaints and Symptoms: No Complaints or Symptoms Oncologic WILMARIE, MCKILLOP ([id number]) Complaints and Symptoms: Review of System Notes: breast cancer Medical History: Positive for: Received Radiation - 2015 Psychiatric Complaints and Symptoms: No Complaints or Symptoms HBO Extended History Items Eyes: Cataracts Immunizations Pneumococcal Vaccine: Received Pneumococcal Vaccination: Yes Family and Social History Cancer: No; Diabetes: No; Heart Disease: Yes - Mother; Hereditary Spherocytosis: No; Hypertension: No; Kidney Disease: No; Lung Disease: No; Seizures: No; Stroke: No; Thyroid Problems: No; Tuberculosis: No; Current every day smoker - 1 1.5 a day; Marital Status - Married; Alcohol Use: Never; Drug Use: No History; Caffeine Use: Daily; Financial Concerns:  No; Food, Clothing or Shelter Needs: No; Support System Lacking: No; Transportation Concerns: No; Advanced Directives: No; Patient does not want information on Advanced Directives; Do not resuscitate: No; Living Will: Yes (Not Provided); Medical Power of Attorney: No Physician Affirmation I have reviewed and agree with the above information. Electronic Signature(s) Signed: 01/14/2017 2:38:46 PM By: Alric Quan Signed: 01/14/2017 3:11:09 PM By: Christin Fudge MD, FACS Entered By: Christin Fudge on 01/14/2017 [id number] Garson, [name] -------------------------------------------------------------------------------- SuperBill Details Patient Name: SIRIYAH, MACLAREN. Date of Service: 01/14/2017 Medical Record Number: [id number] Patient Account Number: [id number] Date of Birth/Sex: [birth year] (82 y.o. 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 I89.0 Lymphedema, not elsewhere classified F17.218 Nicotine dependence, cigarettes, with other nicotine-induced disorders Facility Procedures CPT4 Code Description: [id number] - WOUND CARE VISIT-LEV 3 EST PT Modifier: Quantity: 1 CPT4 Code Description: [id number] - DEB SUBQ TISSUE 20 SQ CM/< ICD-10 Description Diagnosis E11.622 Type 2 diabetes mellitus with other skin ulcer L97.212 Non-pressure chronic ulcer of right calf with fat la I89.0 Lymphedema, not elsewhere classified Modifier: yer exposed Quantity: 1 CPT4 Code Description: [id number] CESSATION 3-10MINS ICD-10 Description Diagnosis E11.622 Type 2 diabetes mellitus with other skin ulcer L97.212 Non-pressure chronic ulcer of right calf with fat  la F17.218 Nicotine dependence, cigarettes,  with other nicotine Modifier: yer exposed -induced disor Quantity: 1 ders Physician Procedures CPT4 Code Description: [id number] - WC PHYS LEVEL 4 - NEW PT ICD-10 Description Diagnosis E11.622 Type 2 diabetes mellitus with other skin ulcer L97.212 Non-pressure chronic ulcer of right calf with fat l I89.0 Lymphedema, not elsewhere classified  HALLEL, MELLAND ([id number]) Modifier: 25 ayer exposed Quantity: 1 Electronic Signature(s) Signed: 01/14/2017 2:38:46 PM By: Alric Quan Signed: 01/14/2017 3:11:09 PM By: Christin Fudge MD, FACS Previous Signature: 01/14/2017 11:13:26 AM Version By: Christin Fudge MD, FACS Entered By: Alric Quan on 01/14/2017 14:14:38

## 2017-01-15 NOTE — Progress Notes (Signed)
Deborah, [name] ([id number]) Visit Report for 01/14/2017 Abuse/Suicide Risk Screen Details Patient Name: [name], [name]. Date of Service: 01/14/2017 9:45 AM Medical Record Number: [id number] Patient Account Number: [id number] Date of Birth/Sex: Mar 26, 1935 (81 y.o. Female) Treating RN: Carolyne Fiscal, [name] Primary Care Maylynn Orzechowski: Lynett Fish Other Clinician: Referring Garet Hooton: Lynett Fish Treating Kimbria Camposano/Extender: Frann Rider in Treatment: 0 Abuse/Suicide Risk Screen Items Answer ABUSE/SUICIDE RISK SCREEN: Has anyone close to you tried to hurt or harm you recentlyo No Do you feel uncomfortable with anyone in your familyo No Has anyone forced you do things that you didnot want to doo No Do you have any thoughts of harming yourselfo No Patient displays signs or symptoms of abuse and/or neglect. No Electronic Signature(s) Signed: 01/14/2017 2:38:46 PM By: Alric Quan Entered By: Alric Quan on 01/14/2017 [id number] Madarang, Nigel Jenetta Downer ([id number]) -------------------------------------------------------------------------------- Activities of Daily Living Details Patient Name: [name], [name] Date of Service: 01/14/2017 9:45 AM Medical Record Number: [id number] Patient Account Number: [id number] Date of Birth/Sex: [birth year] (81 y.o. Female) Treating RN: Carolyne Fiscal, [name] Primary Care Yisel Megill: Lynett Fish Other Clinician: Referring Finlee Milo: Lynett Fish Treating Dameer Speiser/Extender: Frann Rider in Treatment: 0 Activities of Daily Living Items Answer Activities of Daily Living (Please select one for each item) Drive Automobile Completely Able Take Medications Completely Able Use Telephone Completely Able Care for Appearance Completely Able Use Toilet Completely Able Bath / Shower Completely Able Dress Self Completely Able Feed Self Completely Able Walk Completely Able Get In / Out Bed Completely Able Housework Completely Able Prepare Meals  Completely Able Handle Money Completely Able Shop for Self Completely Able Electronic Signature(s) Signed: 01/14/2017 2:38:46 PM By: Alric Quan Entered By: Alric Quan on 01/14/2017 [id number] Schoeneck, Caryl Jenetta Downer ([id number]) -------------------------------------------------------------------------------- Education Assessment Details Patient Name: [name] Date of Service: 01/14/2017 9:45 AM Medical Record Number: [id number] Patient Account Number: [id number] Date of Birth/Sex: February 09, 1935 (81 y.o. Female) Treating RN: Carolyne Fiscal, [name] Primary Care Deagan Sevin: Lynett Fish Other Clinician: Referring Kevona Lupinacci: Lynett Fish Treating Ormond Lazo/Extender: Frann Rider in Treatment: 0 Primary Learner Assessed: Patient Learning Preferences/Education Level/Primary Language Learning Preference: Explanation, Printed Material Highest Education Level: College or Above Preferred Language: English Cognitive Barrier Assessment/Beliefs Language Barrier: No Translator Needed: No Memory Deficit: No Emotional Barrier: No Physical Barrier Assessment Impaired Vision: Yes Glasses Impaired Hearing: Yes HOH Knowledge/Comprehension Assessment Knowledge Level: Medium Comprehension Level: Medium Ability to understand written Medium instructions: Ability to understand verbal Medium instructions: Motivation Assessment Anxiety Level: Calm Cooperation: Cooperative Education Importance: Acknowledges Need Interest in Health Problems: Asks Questions Perception: Coherent Willingness to Engage in Self- High Management Activities: Readiness to Engage in Self- High Management Activities: Electronic Signature(s) Signed: 01/14/2017 2:38:46 PM By: Alric Quan Entered By: Alric Quan on 01/14/2017 [id number] Rojek, Aariya Jenetta Downer ([id number]) MARIATHERESA, DRALLE ([id number]) -------------------------------------------------------------------------------- Fall Risk Assessment  Details Patient Name: [name], Maurine [name]. Date of Service: 01/14/2017 9:45 AM Medical Record Number: [id number] Patient Account Number: [id number] Date of Birth/Sex: 1935/02/18 (81 y.o. Female) Treating RN: Carolyne Fiscal, [name] Primary Care Lache Dagher: Lynett Fish Other Clinician: Referring Alondria Mousseau: Lynett Fish Treating Devonn Giampietro/Extender: Frann Rider in Treatment: 0 Fall Risk Assessment Items Have you had 2 or more falls in the last 12 monthso 0 No Have you had any fall that resulted in injury in the last 12 monthso 0 No FALL RISK ASSESSMENT: History of falling - immediate or within 3 months 0 No Secondary diagnosis 0 No Ambulatory aid None/bed rest/wheelchair/nurse 0 No Crutches/cane/walker 0 No  Furniture 0 No IV Access/Saline Lock 0 No Gait/Training Normal/bed rest/immobile 0 No Weak 0 No Impaired 0 No Mental Status Oriented to own ability 0 Yes Electronic Signature(s) Signed: 01/14/2017 2:38:46 PM By: Alric Quan Entered By: Alric Quan on 01/14/2017 [id number] Vera, [place]. ([id number]) -------------------------------------------------------------------------------- Foot Assessment Details Patient Name: [name], [name] Date of Service: 01/14/2017 9:45 AM Medical Record Number: [id number] Patient Account Number: [id number] Date of Birth/Sex: September 26, 1935 (81 y.o. Female) Treating RN: Carolyne Fiscal, [name] Primary Care Ranyah Groeneveld: Lynett Fish Other Clinician: Referring Marky Buresh: Lynett Fish Treating Shameka Aggarwal/Extender: Frann Rider in Treatment: 0 Foot Assessment Items Site Locations + = Sensation present, - = Sensation absent, C = Callus, U = Ulcer R = Redness, W = Warmth, M = Maceration, PU = Pre-ulcerative lesion F = Fissure, S = Swelling, D = Dryness Assessment Right: Left: Other Deformity: No No Prior Foot Ulcer: No No Prior Amputation: No No Charcot Joint: No No Ambulatory Status: Ambulatory Without Help Gait: Steady Electronic  Signature(s) Signed: 01/14/2017 2:38:46 PM By: Alric Quan Entered By: Alric Quan on 01/14/2017 [id number] Somes, Cresta Jenetta Downer ([id number]) -------------------------------------------------------------------------------- Nutrition Risk Assessment Details Patient Name: Hoehn, Shalee O. Date of Service: 01/14/2017 9:45 AM Medical Record Number: [id number] Patient Account Number: [id number] Date of Birth/Sex: 10-29-1935 (81 y.o. Female) Treating RN: Carolyne Fiscal, [name] Primary Care Arryanna Holquin: Lynett Fish Other Clinician: Referring Jalyiah Shelley: Lynett Fish Treating Chihiro Frey/Extender: Frann Rider in Treatment: 0 Height (in): 64 Weight (lbs): 142.7 Body Mass Index (BMI): 24.5 Nutrition Risk Assessment Items NUTRITION RISK SCREEN: I have an illness or condition that made me change the kind and/or 2 Yes amount of food I eat I eat fewer than two meals per day 3 Yes I eat few fruits and vegetables, or milk products 0 No I have three or more drinks of beer, liquor or wine almost every day 0 No I have tooth or mouth problems that make it hard for me to eat 0 No I don't always have enough money to buy the food I need 0 No I eat alone most of the time 0 No I take three or more different prescribed or over-the-counter drugs a 1 Yes day Without wanting to, I have lost or gained 10 pounds in the last six 0 No months I am not always physically able to shop, cook and/or feed myself 0 No Nutrition Protocols Good Risk Protocol Moderate Risk Protocol Electronic Signature(s) Signed: 01/14/2017 2:38:46 PM By: Alric Quan Entered By: Alric Quan on 01/14/2017 09:55:42

## 2017-01-21 ENCOUNTER — Encounter: Payer: Medicare Other | Admitting: Surgery

## 2017-01-21 DIAGNOSIS — E11622 Type 2 diabetes mellitus with other skin ulcer: Secondary | ICD-10-CM | POA: Diagnosis not present

## 2017-01-23 NOTE — Progress Notes (Signed)
KYMM, SOCK ([id number]) Visit Report for 01/21/2017 Arrival Information Details Patient Name: [name], [name]. Date of Service: 01/21/2017 11:00 AM Medical Record Number: [id number] Patient Account Number: [id number] Date of Birth/Sex: [birth year] (82 y.o. Female) Treating RN: [name] Primary Care [name]: [name] Other Clinician: Referring [name]: [name] Treating [name]/Extender: [name] in Treatment: 1 Visit Information History Since Last Visit Added or deleted any medications: No Patient Arrived: Ambulatory Any new allergies or adverse reactions: No Arrival Time: 11:12 Had a fall or experienced change in No Accompanied By: self activities of daily living that may affect Transfer Assistance: None risk of falls: Patient Identification Verified: Yes Signs or symptoms of abuse/neglect since last No Secondary Verification Process Yes visito Completed: Hospitalized since last visit: No Patient Requires Transmission-Based No Pain Present Now: Yes Precautions: Patient Has Alerts: Yes Patient Alerts: DM II Electronic Signature(s) Signed: 01/21/2017 6:04:53 PM By: Deborah Cool, RN, BSN, Kim RN, BSN Entered By: Deborah Cool, RN, BSN, [name] on 01/21/2017 [id number] [name], [name] -------------------------------------------------------------------------------- Clinic Level of Care Assessment Details Patient Name: [name], [name]. Date of Service: 01/21/2017 11:00 AM Medical Record Number: [id number] Patient Account Number: [id number] Date of Birth/Sex: [birth year] (82 y.o. Female) Treating RN: [name] Primary Care Teona Vargus: [name] Other Clinician: Referring [name]: [name] Treating [name]/Extender: [name] in Treatment: 1 Clinic Level of Care Assessment Items TOOL 4 Quantity Score []  - Use when only an EandM is performed on FOLLOW-UP visit 0 ASSESSMENTS - Nursing Assessment / Reassessment []  - Reassessment of  Co-morbidities (includes updates in patient status) 0 X - Reassessment of Adherence to Treatment Plan 1 5 ASSESSMENTS - Wound and Skin Assessment / Reassessment X - Simple Wound Assessment / Reassessment - one wound 1 5 []  - Complex Wound Assessment / Reassessment - multiple wounds 0 []  - Dermatologic / Skin Assessment (not related to wound area) 0 ASSESSMENTS - Focused Assessment []  - Circumferential Edema Measurements - multi extremities 0 []  - Nutritional Assessment / Counseling / Intervention 0 []  - Lower Extremity Assessment (monofilament, tuning fork, pulses) 0 []  - Peripheral Arterial Disease Assessment (using hand held doppler) 0 ASSESSMENTS - Ostomy and/or Continence Assessment and Care []  - Incontinence Assessment and Management 0 []  - Ostomy Care Assessment and Management (repouching, etc.) 0 PROCESS - Coordination of Care X - Simple Patient / Family Education for ongoing care 1 15 []  - Complex (extensive) Patient / Family Education for ongoing care 0 X - Staff obtains Programmer, systems, Records, Test Results / Process Orders 1 10 []  - Staff telephones HHA, Nursing Homes / Clarify orders / etc 0 []  - Routine Transfer to another Facility (non-emergent condition) 0 Mcclatchy, Caitrin O. ([id number]) []  - Routine Hospital Admission (non-emergent condition) 0 []  - New Admissions / Biomedical engineer / Ordering NPWT, Apligraf, etc. 0 []  - Emergency Hospital Admission (emergent condition) 0 X - Simple Discharge Coordination 1 10 []  - Complex (extensive) Discharge Coordination 0 PROCESS - Special Needs []  - Pediatric / Minor Patient Management 0 []  - Isolation Patient Management 0 []  - Hearing / Language / Visual special needs 0 []  - Assessment of Community assistance (transportation, D/C planning, etc.) 0 []  - Additional assistance / Altered mentation 0 []  - Support Surface(s) Assessment (bed, cushion, seat, etc.) 0 INTERVENTIONS - Wound Cleansing / Measurement X - Simple Wound  Cleansing - one wound 1 5 []  - Complex Wound Cleansing - multiple wounds 0 X - Wound Imaging (photographs - any number  of wounds) 1 5 []  - Wound Tracing (instead of photographs) 0 X - Simple Wound Measurement - one wound 1 5 []  - Complex Wound Measurement - multiple wounds 0 INTERVENTIONS - Wound Dressings X - Small Wound Dressing one or multiple wounds 1 10 []  - Medium Wound Dressing one or multiple wounds 0 []  - Large Wound Dressing one or multiple wounds 0 []  - Application of Medications - topical 0 []  - Application of Medications - injection 0 INTERVENTIONS - Miscellaneous []  - External ear exam 0 Oleksy, Shanica O. ([id number]) []  - Specimen Collection (cultures, biopsies, blood, body fluids, etc.) 0 []  - Specimen(s) / Culture(s) sent or taken to Lab for analysis 0 []  - Patient Transfer (multiple staff / Harrel Lemon Lift / Similar devices) 0 []  - Simple Staple / Suture removal (25 or less) 0 []  - Complex Staple / Suture removal (26 or more) 0 []  - Hypo / Hyperglycemic Management (close monitor of Blood Glucose) 0 []  - Ankle / Brachial Index (ABI) - do not check if billed separately 0 X - Vital Signs 1 5 Has the patient been seen at the hospital within the last three years: Yes Total Score: 75 Level Of Care: New/Established - Level 2 Electronic Signature(s) Signed: 01/21/2017 6:04:53 PM By: Deborah Cool, RN, BSN, Kim RN, BSN Entered By: Deborah Cool, RN, BSN, [name] on 01/21/2017 0000000 [name], [name] -------------------------------------------------------------------------------- Encounter Discharge Information Details Patient Name: [name]. Date of Service: 01/21/2017 11:00 AM Medical Record Number: [id number] Patient Account Number: [id number] Date of Birth/Sex: 01/28/1935 (82 y.o. Female) Treating RN: [name], [name] Primary Care [name]: [name] Other Clinician: Referring [name]: [name] Treating [name]/Extender: [name] in Treatment:  1 Encounter Discharge Information Items Discharge Pain Level: 2 Discharge Condition: Stable Ambulatory Status: Ambulatory Discharge Destination: Home Transportation: Private Auto Accompanied By: self Schedule Follow-up Appointment: Yes Medication Reconciliation completed and provided to Patient/Care Yes Damiel Barthold: Provided on Clinical Summary of Care: 01/21/2017 Form Type Recipient Paper Patient AM Electronic Signature(s) Signed: 01/21/2017 6:04:53 PM By: Deborah Cool RN, BSN, Kim RN, BSN Previous Signature: 01/21/2017 11:38:41 AM Version By: Ruthine Dose Entered By: Deborah Cool RN, BSN, [name] on 01/21/2017 [id number] [name], [name] -------------------------------------------------------------------------------- Lower Extremity Assessment Details Patient Name: Alejo, [name]. Date of Service: 01/21/2017 11:00 AM Medical Record Number: [id number] Patient Account Number: [id number] Date of Birth/Sex: 12-09-1935 (82 y.o. Female) Treating RN: [name] Primary Care Wyonia Fontanella: [name] Other Clinician: Referring Aishani Kalis: [name] Treating Camilia Caywood/Extender: [name] in Treatment: 1 Vascular Assessment Pulses: Dorsalis Pedis Palpable: [Right:Yes] Posterior Tibial Extremity colors, hair growth, and conditions: Extremity Color: [Right:Normal] Hair Growth on Extremity: [Right:No] Temperature of Extremity: [Right:Warm] Capillary Refill: [Right:< 3 seconds] Dependent Rubor: [Right:No] Blanched when Elevated: [Right:No] Lipodermatosclerosis: [Right:No] Toe Nail Assessment Left: Right: Thick: No Discolored: No Deformed: No Improper Length and Hygiene: No Electronic Signature(s) Signed: 01/21/2017 6:04:53 PM By: Deborah Cool, RN, BSN, Kim RN, BSN Entered By: Deborah Cool, RN, BSN, [name] on 01/21/2017 [id number] [name], [name] -------------------------------------------------------------------------------- Multi Wound Chart Details Patient Name: [name]. Date of Service: 01/21/2017 11:00 AM Medical Record Number: [id number] Patient Account Number: [id number] Date of Birth/Sex: 1935/06/17 (82 y.o. Female) Treating RN: [name] Primary Care Ariana Juul: [name] Other Clinician: Referring Mayar Whittier: [name] Treating Lima Chillemi/Extender: [name] in Treatment: 1 Vital Signs Height(in): 64 Pulse(bpm): 84 Weight(lbs): 142.7 Blood Pressure 146/68 (mmHg): Body Mass Index(BMI): 24 Temperature(F): 98.8 Respiratory Rate 18 (breaths/min): Photos: [N/A:N/A] Wound Location:  Right Lower Leg - N/A N/A Posterior Wounding Event: Trauma N/A N/A Primary Etiology: Diabetic Wound/Ulcer of N/A N/A the Lower Extremity Secondary Etiology: Trauma, Other N/A N/A Comorbid History: Cataracts, Anemia, N/A N/A Chronic Obstructive Pulmonary Disease (COPD), Type II Diabetes, Received Radiation Date Acquired: 12/17/2016 N/A N/A Weeks of Treatment: 1 N/A N/A Wound Status: Open N/A N/A Measurements L x W x D 1.2x1.3x0.1 N/A N/A (cm) Area (cm) : 1.225 N/A N/A Volume (cm) : 0.123 N/A N/A % Reduction in Area: 51.30% N/A N/A % Reduction in Volume: 51.00% N/A N/A Classification: Grade 2 N/A N/A Exudate Amount: Large N/A N/A Exudate Type: Serosanguineous N/A N/A Exudate Color: red, brown N/A N/A Jalloh, Dawt O. ([id number]) Wound Margin: Distinct, outline attached N/A N/A Granulation Amount: None Present (0%) N/A N/A Necrotic Amount: Large (67-100%) N/A N/A Necrotic Tissue: Eschar, Adherent Slough N/A N/A Exposed Structures: Fat Layer (Subcutaneous N/A N/A Tissue) Exposed: Yes Fascia: No Tendon: No Muscle: No Joint: No Bone: No Epithelialization: None N/A N/A Periwound Skin Texture: Excoriation: Yes N/A N/A Periwound Skin No Abnormalities Noted N/A N/A Moisture: Periwound Skin Color: Erythema: Yes N/A N/A Erythema Location: Circumferential N/A N/A Temperature: No Abnormality N/A N/A Tenderness on Yes N/A  N/A Palpation: Wound Preparation: Ulcer Cleansing: N/A N/A Rinsed/Irrigated with Saline Topical Anesthetic Applied: Other: lidocaine 4% Treatment Notes Wound #1 (Right, Posterior Lower Leg) 1. Cleansed with: Clean wound with Normal Saline 2. Anesthetic Topical Lidocaine 4% cream to wound bed prior to debridement 4. Dressing Applied: Aquacel Ag 5. Secondary Dressing Applied Bordered Foam Dressing Electronic Signature(s) Signed: 01/21/2017 11:51:14 AM By: Christin Fudge MD, FACS Entered By: Christin Fudge on 01/21/2017 [id number] [name], [name] -------------------------------------------------------------------------------- [place] Details Patient Name: [name], [name]. Date of Service: 01/21/2017 11:00 AM Medical Record Number: [id number] Patient Account Number: [id number] Date of Birth/Sex: 18-May-1935 (82 y.o. Female) Treating RN: [name] Primary Care Ridley Schewe: [name] Other Clinician: Referring Kimberlee Shoun: [name] Treating Chamari Cutbirth/Extender: [name] in Treatment: 1 Active Inactive ` Nutrition Nursing Diagnoses: Imbalanced nutrition Impaired glucose control: actual or potential Potential for alteratiion in Nutrition/Potential for imbalanced nutrition Goals: Patient/caregiver agrees to and verbalizes understanding of need to use nutritional supplements and/or vitamins as prescribed Date Initiated: 01/14/2017 Target Resolution Date: 03/20/2017 Goal Status: Active Interventions: Assess patient nutrition upon admission and as needed per policy Provide education on elevated blood sugars and impact on wound healing Notes: ` Orientation to the Wound Care Program Nursing Diagnoses: Knowledge deficit related to the wound healing center program Goals: Patient/caregiver will verbalize understanding of the [place] [facility] Date Initiated: 01/14/2017 Target Resolution Date: 02/20/2017 Goal Status:  Active Interventions: Provide education on orientation to the wound center Notes: ` Pain, Acute or Chronic [name], [name] Nursing Diagnoses: Pain, acute or chronic: actual or potential Potential alteration in comfort, pain Goals: Patient will verbalize adequate pain control and receive pain control interventions during procedures as needed Date Initiated: 01/14/2017 Target Resolution Date: 03/20/2017 Goal Status: Active Interventions: Assess comfort goal upon admission Complete pain assessment as per visit requirements Notes: ` Wound/Skin Impairment Nursing Diagnoses: Impaired tissue integrity Knowledge deficit related to smoking impact on wound healing Knowledge deficit related to ulceration/compromised skin integrity Goals: Ulcer/skin breakdown will have a volume reduction of 80% by week 12 Date Initiated: 01/14/2017 Target Resolution Date: 03/20/2017 Goal Status: Active Interventions: Assess patient/caregiver ability to perform ulcer/skin care regimen upon admission and as needed Assess ulceration(s) every visit Provide education on smoking Notes: Electronic  Signature(s) Signed: 01/21/2017 6:04:53 PM By: Deborah Cool, RN, BSN, Kim RN, BSN Entered By: Deborah Cool, RN, BSN, [name] on 01/21/2017 [id number] [name], [name] -------------------------------------------------------------------------------- Pain Assessment Details Patient Name: MEESHA, BROMAN. Date of Service: 01/21/2017 11:00 AM Medical Record Number: [id number] Patient Account Number: [id number] Date of Birth/Sex: Dec 15, 1934 (82 y.o. Female) Treating RN: [name] Primary Care Amman Bartel: [name] Other Clinician: Referring Jermie Hippe: [name] Treating Lilton Pare/Extender: [name] in Treatment: 1 Active Problems Location of Pain Severity and Description of Pain Patient Has Paino Yes Site Locations Pain Location: Pain in Ulcers With Dressing Change: Yes Duration of the  Pain. Constant / Intermittento Intermittent Rate the pain. Current Pain Level: 5 Worst Pain Level: 7 Character of Pain Describe the Pain: Shooting Pain Management and Medication Current Pain Management: Goals for Pain Management Topical or injectable lidocaine is offered to patient for acute pain when surgical debridement is performed. If needed, Patient is instructed to use over the counter pain medication for the following 24-48 hours after debridement. Wound care MDs do not prescribed pain medications. Patient has chronic pain or uncontrolled pain. Patient has been instructed to make an appointment with their Primary Care Physician for pain management. Electronic Signature(s) Signed: 01/21/2017 6:04:53 PM By: Deborah Cool, RN, BSN, Kim RN, BSN Entered By: Deborah Cool, RN, BSN, [name] on 01/21/2017 [id number] Gandolfo, [name] -------------------------------------------------------------------------------- Patient/Caregiver Education Details Patient Name: MARKECIA, IGARASHI. Date of Service: 01/21/2017 11:00 AM Medical Record Number: [id number] Patient Account Number: [id number] Date of Birth/Gender: Dec 31, 1934 (82 y.o. Female) Treating RN: [name] Primary Care Physician: [name] Other Clinician: Referring Physician: Lynett [name] Treating Physician/Extender: [name] in Treatment: 1 Education Assessment Education Provided To: Patient Education Topics Provided Elevated Blood Sugar/ Impact on Healing: Handouts: Elevated Blood Sugars: How Do They Affect Wound Healing Methods: Explain/Verbal Responses: State content correctly Smoking and Wound Healing: Handouts: Smoking and Wound Healing Methods: Explain/Verbal Responses: State content correctly Venous: Controlling Swelling with Compression Stockings , Other: patient to buy 15-31mmhg Handouts: compression socks Methods: Explain/Verbal Wound/Skin Impairment: Handouts: Caring for Your Ulcer, Other: continue  wound care as prescribed Methods: Demonstration, Explain/Verbal Responses: State content correctly Electronic Signature(s) Signed: 01/21/2017 6:04:53 PM By: Deborah Cool, RN, BSN, Kim RN, BSN Entered By: Deborah Cool, RN, BSN, [name] on 01/21/2017 [id number] Hare, [name] -------------------------------------------------------------------------------- Wound Assessment Details Patient Name: Widener, [name]. Date of Service: 01/21/2017 11:00 AM Medical Record Number: [id number] Patient Account Number: [id number] Date of Birth/Sex: July 19, 1935 (82 y.o. Female) Treating RN: [name] Primary Care Jaidence Geisler: [name] Other Clinician: Referring Bellatrix Devonshire: [name] Treating Farrie Sann/Extender: [name] in Treatment: 1 Wound Status Wound Number: 1 Primary Diabetic Wound/Ulcer of the Lower Etiology: Extremity Wound Location: Right Lower Leg - Posterior Secondary Trauma, Other Wounding Event: Trauma Etiology: Date Acquired: 12/17/2016 Wound Open Weeks Of Treatment: 1 Status: Clustered Wound: No Comorbid Cataracts, Anemia, Chronic History: Obstructive Pulmonary Disease (COPD), Type II Diabetes, Received Radiation Photos Wound Measurements Length: (cm) 1.2 Width: (cm) 1.3 Depth: (cm) 0.1 Area: (cm) 1.225 Volume: (cm) 0.123 % Reduction in Area: 51.3% % Reduction in Volume: 51% Epithelialization: None Tunneling: No Undermining: No Wound Description Classification: Grade 2 Foul Odor Aft Wound Margin: Distinct, outline attached Slough/Fibrin Exudate Amount: Large Exudate Type: Serosanguineous Exudate Color: red, brown er Cleansing: No o No Wound Bed Granulation Amount: None Present (0%) Exposed Structure Necrotic Amount: Large (67-100%) Fascia Exposed: No Necrotic Quality: Eschar, Adherent Slough Fat Layer (Subcutaneous Tissue) Exposed: Yes Tendon  Exposed: No Deloria, Tamarah O. ([id number]) Muscle Exposed: No Joint Exposed: No Bone Exposed: No Periwound  Skin Texture Texture Color No Abnormalities Noted: No No Abnormalities Noted: No Excoriation: Yes Erythema: Yes Erythema Location: Circumferential Moisture No Abnormalities Noted: No Temperature / Pain Temperature: No Abnormality Tenderness on Palpation: Yes Wound Preparation Ulcer Cleansing: Rinsed/Irrigated with Saline Topical Anesthetic Applied: Other: lidocaine 4%, Treatment Notes Wound #1 (Right, Posterior Lower Leg) 1. Cleansed with: Clean wound with Normal Saline 2. Anesthetic Topical Lidocaine 4% cream to wound bed prior to debridement 4. Dressing Applied: Aquacel Ag 5. Secondary Dressing Applied Bordered Foam Dressing Electronic Signature(s) Signed: 01/21/2017 6:04:53 PM By: Deborah Cool, RN, BSN, Kim RN, BSN Entered By: Deborah Cool, RN, BSN, [name] on 01/21/2017 [id number] Kreuzer, [name] -------------------------------------------------------------------------------- Vitals Details Patient Name: [name] Date of Service: 01/21/2017 11:00 AM Medical Record Number: [id number] Patient Account Number: [id number] Date of Birth/Sex: 12/27/1934 (82 y.o. Female) Treating RN: [name] Primary Care Axel Frisk: [name] Other Clinician: Referring Alvy Alsop: [name] Treating Skylar Priest/Extender: [name] in Treatment: 1 Vital Signs Time Taken: 11:10 Temperature (F): 98.8 Height (in): 64 Pulse (bpm): 84 Weight (lbs): 142.7 Respiratory Rate (breaths/min): 18 Body Mass Index (BMI): 24.5 Blood Pressure (mmHg): 146/68 Reference Range: 80 - 120 mg / dl Electronic Signature(s) Signed: 01/21/2017 6:04:53 PM By: Deborah Cool, RN, BSN, Kim RN, BSN Entered By: Deborah Cool, RN, BSN, [name] on 01/21/2017 11:15:10

## 2017-01-23 NOTE — Progress Notes (Signed)
ERICE, BAHN ([id number]) Visit Report for 01/21/2017 Chief Complaint Document Details Patient Name: [name], [name]. Date of Service: 01/21/2017 11:00 AM Medical Record Number: [id number] Patient Account Number: [id number] Date of Birth/Sex: 03-18-1935 (82 y.o. Female) Treating RN: Carolyne Fiscal, [name] Primary Care Provider: Lynett Fish Other Clinician: Referring Provider: Lynett Fish Treating Provider/Extender: Frann Rider in Treatment: 1 Information Obtained from: Patient Chief Complaint Patients presents for treatment of an open diabetic ulcer to the right lower extremity which is had for about a month Electronic Signature(s) Signed: 01/21/2017 11:51:21 AM By: Christin Fudge MD, FACS Entered By: Christin Fudge on 01/21/2017 0000000 Bratz, Marriah Jenetta Downer ([id number]) -------------------------------------------------------------------------------- HPI Details Patient Name: [name]. Date of Service: 01/21/2017 11:00 AM Medical Record Number: [id number] Patient Account Number: [id number] Date of Birth/Sex: August 13, 1935 (82 y.o. Female) Treating RN: Carolyne Fiscal, [name] Primary Care Provider: Lynett Fish Other Clinician: Referring Provider: Lynett Fish Treating Provider/Extender: Frann Rider in Treatment: 1 History of Present Illness Location: right lower posterior calf Quality: Patient reports experiencing a sharp pain to affected area(s). Severity: Patient states wound are getting worse. Duration: Patient has had the wound for < 4 weeks prior to presenting for treatment Timing: Pain in wound is Intermittent (comes and goes Context: The wound occurred when the patient hit the back of her leg against a blunt object at home which opened out a wound Modifying Factors: Other treatment(s) tried include:Keflex and doxycycline by mouth Associated Signs and Symptoms: Patient reports having increase swelling. HPI Description: 82 year old patient who is known to have  diabetes mellitus and tobacco abuse has had a injury to the right posterior calf about a month ago. He has been treated with local care and 2 courses of antibiotics which include Keflex and doxycycline which she has completed. Smokes about a pack and a half of cigarettes a day and has a past medical history of COPD, diabetes mellitus, hyperlipidemia,psoriasis and hypothyroidism. 01/21/2017 -- she is still working on giving up smoking but is not inclined to do so. Electronic Signature(s) Signed: 01/21/2017 11:51:44 AM By: Christin Fudge MD, FACS Entered By: Christin Fudge on 01/21/2017 [id number] Bonaventure, [name] -------------------------------------------------------------------------------- Physical Exam Details Patient Name: [name]. Date of Service: 01/21/2017 11:00 AM Medical Record Number: [id number] Patient Account Number: [id number] Date of Birth/Sex: 1935-11-03 (82 y.o. Female) Treating RN: Carolyne Fiscal, [name] Primary Care Provider: Lynett Fish Other Clinician: Referring Provider: Lynett Fish Treating Provider/Extender: Frann Rider in Treatment: 1 Constitutional . Pulse regular. Respirations normal and unlabored. Afebrile. . Eyes Nonicteric. Reactive to light. Ears, Nose, Mouth, and Throat Lips, teeth, and gums WNL.Marland Kitchen Moist mucosa without lesions. Neck supple and nontender. No palpable supraclavicular or cervical adenopathy. Normal sized without goiter. Respiratory WNL. No retractions.. Breath sounds WNL, No rubs, rales, rhonchi, or wheeze.. Cardiovascular Heart rhythm and rate regular, no murmur or gallop.. Pedal Pulses WNL. No clubbing, cyanosis or edema. Chest Breasts symmetical and no nipple discharge.. Breast tissue WNL, no masses, lumps, or tenderness.. Gastrointestinal (GI) Abdomen without masses or tenderness.. No liver or spleen enlargement or tenderness.. Genitourinary (GU) No hydrocele, spermatocele, tenderness of the cord, or  testicular mass.Marland Kitchen Penis without lesions.Lowella Fairy without lesions. No cystocele, or rectocele. Pelvic support intact, no discharge.Marland Kitchen Urethra without masses, tenderness or scarring.Marland Kitchen Lymphatic No adneopathy. No adenopathy. No adenopathy. Musculoskeletal Adexa without tenderness or enlargement.. Digits and nails w/o clubbing, cyanosis, infection, petechiae, ischemia, or inflammatory conditions.. Integumentary (Hair, Skin) No suspicious lesions. No crepitus or fluctuance. No peri-wound  warmth or erythema. No masses.Marland Kitchen Psychiatric Judgement and insight Intact.. No evidence of depression, anxiety, or agitation.. Notes the lymphedema still persistent and the ulcer itself looks much cleaner today and no sharp debridement was done Electronic Signature(s) DARISSA, SPENNER ([id number]) Signed: 01/21/2017 11:52:09 AM By: Christin Fudge MD, FACS Entered By: Christin Fudge on 01/21/2017 [id number] Christenson, Millard. ([id number]) -------------------------------------------------------------------------------- Physician Orders Details Patient Name: [name], [name] Date of Service: 01/21/2017 11:00 AM Medical Record Number: [id number] Patient Account Number: [id number] Date of Birth/Sex: [birth year] (82 y.o. Female) Treating RN: Cornell Barman Primary Care Provider: Lynett Fish Other Clinician: Referring Provider: Lynett Fish Treating Provider/Extender: Frann Rider in Treatment: 1 Verbal / Phone Orders: No Diagnosis Coding Wound Cleansing Wound #1 Right,Posterior Lower Leg o Clean wound with Normal Saline. o Cleanse wound with mild soap and water o May Shower, gently pat wound dry prior to applying new dressing. Anesthetic Wound #1 Right,Posterior Lower Leg o Topical Lidocaine 4% cream applied to wound bed prior to debridement Skin Barriers/Peri-Wound Care Wound #1 Right,Posterior Lower Leg o Skin Prep Primary Wound Dressing Wound #1 Right,Posterior Lower Leg o Aquacel  Ag Secondary Dressing Wound #1 Right,Posterior Lower Leg o Boardered Foam Dressing Dressing Change Frequency Wound #1 Right,Posterior Lower Leg o Change dressing every other day. Follow-up Appointments Wound #1 Right,Posterior Lower Leg o Return Appointment in 1 week. Edema Control Wound #1 Right,Posterior Lower Leg o Elevate legs to the level of the heart and pump ankles as often as possible o Support Garment 10-20 mm/Hg pressure to: - Patient to pick up 15-30mmhg stockings for her right leg. SELESTE, JAKEWAY ([id number]) Additional Orders / Instructions Wound #1 Right,Posterior Lower Leg o Stop Smoking o Increase protein intake. Medications-please add to medication list. Wound #1 Right,Posterior Lower Leg o Other: - vitamin c, zinc, MVI Electronic Signature(s) Signed: 01/21/2017 4:21:54 PM By: Christin Fudge MD, FACS Signed: 01/21/2017 6:04:53 PM By: Gretta Cool, RN, BSN, Kim RN, BSN Entered By: Gretta Cool, RN, BSN, Kim on 01/21/2017 [id number] [place], [name] -------------------------------------------------------------------------------- Problem List Details Patient Name: KIMERLY, DUMARS. Date of Service: 01/21/2017 11:00 AM Medical Record Number: [id number] Patient Account Number: [id number] Date of Birth/Sex: 1935-09-12 (82 y.o. 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 Date of Service: 01/21/2017 11:00 AM Medical Record Number: [id number] Patient Account Number: [id number] Date of Birth/Sex: [birth year] (82 y.o. Female) Treating RN: Carolyne Fiscal, [name] Primary Care Provider: Lynett Fish Other Clinician: Referring Provider: Lynett Fish Treating Provider/Extender: Frann Rider in Treatment: 1 Subjective Chief Complaint Information obtained from Patient Patients presents for treatment of an open diabetic ulcer to the right lower extremity which is had for about a month History of Present Illness (HPI) The following HPI elements were documented for the patient's wound: Location: right lower posterior calf Quality: Patient reports experiencing a sharp pain to affected area(s). Severity: Patient states wound are  getting worse. Duration: Patient has had the wound for < 4 weeks prior to presenting for treatment Timing: Pain in wound is Intermittent (comes and goes Context: The wound occurred when the patient hit the back of her leg against a blunt object at home which opened out a wound Modifying Factors: Other treatment(s) tried include:Keflex and doxycycline by mouth Associated Signs and Symptoms: Patient reports having increase swelling. 82 year old patient who is known to have diabetes mellitus and tobacco abuse has had a injury to the right posterior calf about a month ago. He has been treated with local care and 2 courses of antibiotics which include Keflex and doxycycline which she has completed. Smokes about a pack and a half of cigarettes a day and has a past medical history of COPD, diabetes mellitus, hyperlipidemia,psoriasis and hypothyroidism. 01/21/2017 --  she is still working on giving up smoking but is not inclined to do so. Objective Constitutional Pulse regular. Respirations normal and unlabored. Afebrile. Vitals Time Taken: 11:10 AM, Height: 64 in, Weight: 142.7 lbs, BMI: 24.5, Temperature: 98.8 F, Pulse: 84 bpm, Respiratory Rate: 18 breaths/min, Blood Pressure: 146/68 mmHg. ROBERTIA, JARNAGIN ([id number]) Eyes Nonicteric. Reactive to light. Ears, Nose, Mouth, and Throat Lips, teeth, and gums WNL.Marland Kitchen Moist mucosa without lesions. Neck supple and nontender. No palpable supraclavicular or cervical adenopathy. Normal sized without goiter. Respiratory WNL. No retractions.. Breath sounds WNL, No rubs, rales, rhonchi, or wheeze.. Cardiovascular Heart rhythm and rate regular, no murmur or gallop.. Pedal Pulses WNL. No clubbing, cyanosis or edema. Chest Breasts symmetical and no nipple discharge.. Breast tissue WNL, no masses, lumps, or tenderness.. Gastrointestinal (GI) Abdomen without masses or tenderness.. No liver or spleen enlargement or tenderness.. Genitourinary (GU) No hydrocele, spermatocele, tenderness of the cord, or testicular mass.Marland Kitchen Penis without lesions.Lowella Fairy without lesions. No cystocele, or rectocele. Pelvic support intact, no discharge.Marland Kitchen Urethra without masses, tenderness or scarring.Marland Kitchen Lymphatic No adneopathy. No adenopathy. No adenopathy. Musculoskeletal Adexa without tenderness or enlargement.. Digits and nails w/o clubbing, cyanosis, infection, petechiae, ischemia, or inflammatory conditions.Marland Kitchen Psychiatric Judgement and insight Intact.. No evidence of depression, anxiety, or agitation.. General Notes: the lymphedema still persistent and the ulcer itself looks much cleaner today and no sharp debridement was done Integumentary (Hair, Skin) No suspicious lesions. No crepitus or fluctuance. No peri-wound warmth or erythema. No masses.. Wound #1 status is Open. Original cause of wound was Trauma. The wound is located  on the Right,Posterior Lower Leg. The wound measures 1.2cm length x 1.3cm width x 0.1cm depth; 1.225cm^2 area and 0.123cm^3 volume. There is Fat Layer (Subcutaneous Tissue) Exposed exposed. There is no tunneling or undermining noted. There is a large amount of serosanguineous drainage noted. The wound margin is distinct with the outline attached to the wound base. There is no granulation within the wound bed. There is a large (67-100%) amount of necrotic tissue within the wound bed including Eschar and Adherent Slough. The periwound skin appearance exhibited: Excoriation, Erythema. The surrounding wound skin color is noted with erythema which is circumferential. Periwound temperature was noted as No Abnormality. TIMECA, VALIDO ([id number]) The periwound has tenderness on palpation. Assessment Active Problems ICD-10 E11.622 - Type 2 diabetes mellitus with other skin ulcer L97.212 - Non-pressure chronic ulcer of right calf with fat layer exposed I89.0 - Lymphedema, not elsewhere classified F17.218 - Nicotine dependence, cigarettes, with other nicotine-induced disorders Plan Wound Cleansing: Wound #1 Right,Posterior Lower Leg: Clean wound with Normal Saline. Cleanse wound with mild soap and water May Shower, gently pat wound dry  prior to applying new dressing. Anesthetic: Wound #1 Right,Posterior Lower Leg: Topical Lidocaine 4% cream applied to wound bed prior to debridement Skin Barriers/Peri-Wound Care: Wound #1 Right,Posterior Lower Leg: Skin Prep Primary Wound Dressing: Wound #1 Right,Posterior Lower Leg: Aquacel Ag Secondary Dressing: Wound #1 Right,Posterior Lower Leg: Boardered Foam Dressing Dressing Change Frequency: Wound #1 Right,Posterior Lower Leg: Change dressing every other day. Follow-up Appointments: Wound #1 Right,Posterior Lower Leg: Return Appointment in 1 week. Edema Control: Wound #1 Right,Posterior Lower Leg: Elevate legs to the level of the heart and  pump ankles as often as possible Support Garment 10-20 mm/Hg pressure to: - Patient to pick up 15-63mmhg stockings for her right leg. Additional Orders / Instructions: NAYLANI, STALSBERG ([id number]) Wound #1 Right,Posterior Lower Leg: Stop Smoking Increase protein intake. Medications-please add to medication list.: Wound #1 Right,Posterior Lower Leg: Other: - vitamin c, zinc, MVI After review I have recommended: 1. Aquacel Ag with a bordered foam to be changed every other day 2. Elevation and exercise 3. Good control of her diabetes mellitus 4. Adequate protein, vitamin A, vitamin C and zinc 5. I have again discussed with her the need to completely give up smoking and I have discussed the risk benefits and alternatives She has had all questions answered and said she will be compliant Electronic Signature(s) Signed: 01/21/2017 11:53:13 AM By: Christin Fudge MD, FACS Entered By: Christin Fudge on 01/21/2017 [id number] Matuska, Naylah Jenetta Downer ([id number]) -------------------------------------------------------------------------------- SuperBill Details Patient Name: Gallier, [name]. Date of Service: 01/21/2017 Medical Record Number: [id number] Patient Account Number: [id number] Date of Birth/Sex: 17-Feb-1935 (82 y.o. Female) Treating RN: Carolyne Fiscal, [name] Primary Care Provider: Lynett Fish Other Clinician: Referring Provider: Lynett Fish Treating Provider/Extender: Christin Fudge Service Line: Weeks in Treatment: 1 Diagnosis Coding ICD-10 Codes Code Description [phone] Type 2 diabetes mellitus with other skin ulcer L97.212 Non-pressure chronic ulcer of right calf with fat layer exposed I89.0 Lymphedema, not elsewhere classified F17.218 Nicotine dependence, cigarettes, with other nicotine-induced disorders Facility Procedures CPT4 Code: [id number] Description: [phone] - WOUND CARE VISIT-LEV 2 EST PT Modifier: Quantity: 1 Physician Procedures CPT4 Code Description: [id number] - WC PHYS  LEVEL 3 - EST PT ICD-10 Description Diagnosis E11.622 Type 2 diabetes mellitus with other skin ulcer L97.212 Non-pressure chronic ulcer of right calf with fat l I89.0 Lymphedema, not elsewhere classified  F17.218 Nicotine dependence, cigarettes, with other nicotin Modifier: ayer exposed e-induced di Quantity: 1 sorders Electronic Signature(s) Signed: 01/21/2017 11:53:28 AM By: Christin Fudge MD, FACS Entered By: Christin Fudge on 01/21/2017 11:53:28

## 2017-01-28 ENCOUNTER — Encounter: Payer: Medicare Other | Admitting: Surgery

## 2017-01-28 DIAGNOSIS — E11622 Type 2 diabetes mellitus with other skin ulcer: Secondary | ICD-10-CM | POA: Diagnosis not present

## 2017-01-30 NOTE — Progress Notes (Signed)
KECHIA, GROULX ([id number]) Visit Report for 01/28/2017 Arrival Information Details Patient Name: [name], [name]. Date of Service: 01/28/2017 2:45 PM Medical Record Number: [id number] Patient Account Number: [id number] Date of Birth/Sex: 1935-08-14 (82 y.o. Female) Treating RN: Carolyne Fiscal, [name] Primary Care Kynzi Levay: Lynett Fish Other Clinician: Referring Huntley Knoop: Lynett Fish Treating Kacia Halley/Extender: Frann Rider in Treatment: 2 Visit Information History Since Last Visit All ordered tests and consults were completed: No Patient Arrived: Ambulatory Added or deleted any medications: No Arrival Time: 14:46 Any new allergies or adverse reactions: No Accompanied By: self Had a fall or experienced change in No Transfer Assistance: None activities of daily living that may affect Patient Identification Verified: Yes risk of falls: Secondary Verification Process Yes Signs or symptoms of abuse/neglect since last No Completed: visito Patient Requires Transmission-Based No Hospitalized since last visit: No Precautions: Has Dressing in Place as Prescribed: Yes Patient Has Alerts: Yes Pain Present Now: No Patient Alerts: DM II Electronic Signature(s) Signed: 01/29/2017 4:03:50 PM By: Alric Quan Entered By: Alric Quan on 01/28/2017 [id number] Spurr, Katricia Jenetta Downer ([id number]) -------------------------------------------------------------------------------- Encounter Discharge Information Details Patient Name: [name]. Date of Service: 01/28/2017 2:45 PM Medical Record Number: [id number] Patient Account Number: [id number] Date of Birth/Sex: 10/31/1935 (82 y.o. Female) Treating RN: Carolyne Fiscal, [name] Primary Care Nanea Jared: Lynett Fish Other Clinician: Referring Khylie Larmore: Lynett Fish Treating Kathlyne Loud/Extender: Frann Rider in Treatment: 2 Encounter Discharge Information Items Discharge Pain Level: 0 Discharge Condition: Stable Ambulatory  Status: Ambulatory Discharge Destination: Home Private Transportation: Auto Accompanied By: self Schedule Follow-up Appointment: Yes Medication Reconciliation completed and No provided to Patient/Care Janean Eischen: Clinical Summary of Care: Electronic Signature(s) Signed: 01/29/2017 4:03:50 PM By: Alric Quan Previous Signature: 01/28/2017 3:11:10 PM Version By: Ruthine Dose Entered By: Alric Quan on 01/28/2017 [id number] Osterlund, Elmer. ([id number]) -------------------------------------------------------------------------------- Lower Extremity Assessment Details Patient Name: Hollander, [name]. Date of Service: 01/28/2017 2:45 PM Medical Record Number: [id number] Patient Account Number: [id number] Date of Birth/Sex: 12-Jun-1935 (82 y.o. Female) Treating RN: Carolyne Fiscal, [name] Primary Care Dishon Kehoe: Lynett Fish Other Clinician: Referring Devann Cribb: Lynett Fish Treating Aleeta Schmaltz/Extender: Frann Rider in Treatment: 2 Vascular Assessment Pulses: Dorsalis Pedis Palpable: [Right:Yes] Posterior Tibial Extremity colors, hair growth, and conditions: Extremity Color: [Right:Mottled] Temperature of Extremity: [Right:Warm] Capillary Refill: [Right:< 3 seconds] Electronic Signature(s) Signed: 01/29/2017 4:03:50 PM By: Alric Quan Entered By: Alric Quan on 01/28/2017 [id number] Mcclenny, Janith Jenetta Downer ([id number]) -------------------------------------------------------------------------------- Multi Wound Chart Details Patient Name: [name], [name]. Date of Service: 01/28/2017 2:45 PM Medical Record Number: [id number] Patient Account Number: [id number] Date of Birth/Sex: 1935-10-29 (82 y.o. Female) Treating RN: Carolyne Fiscal, [name] Primary Care Sheetal Lyall: Lynett Fish Other Clinician: Referring Kyeisha Janowicz: Lynett Fish Treating Mohini Heathcock/Extender: Frann Rider in Treatment: 2 Photos: [1:No Photos] [N/A:N/A] Wound Location: [1:Right Lower Leg - Posterior]  [N/A:N/A] Wounding Event: [1:Trauma] [N/A:N/A] Primary Etiology: [1:Diabetic Wound/Ulcer of N/A the Lower Extremity] Secondary Etiology: [1:Trauma, Other] [N/A:N/A] Comorbid History: [1:Cataracts, Anemia, Chronic Obstructive Pulmonary Disease (COPD), Type II Diabetes, Received Radiation] [N/A:N/A] Date Acquired: [1:12/17/2016] [N/A:N/A] Weeks of Treatment: [1:2] [N/A:N/A] Wound Status: [1:Open] [N/A:N/A] Measurements L x W x D 0.7x1x0.1 [N/A:N/A] (cm) Area (cm) : [1:0.55] [N/A:N/A] Volume (cm) : [1:0.055] [N/A:N/A] % Reduction in Area: [1:78.10%] [N/A:N/A] % Reduction in Volume: 78.10% [N/A:N/A] Classification: [1:Grade 2] [N/A:N/A] Exudate Amount: [1:Large] [N/A:N/A] Exudate Type: [1:Serosanguineous] [N/A:N/A] Exudate Color: [1:red, brown] [N/A:N/A] Wound Margin: [1:Distinct, outline attached N/A] Granulation Amount: [1:None Present (0%)] [N/A:N/A] Necrotic Amount: [1:Large (67-100%)] [N/A:N/A] Necrotic Tissue: [1:Eschar, Adherent Slough  N/A] Exposed Structures: [1:Fat Layer (Subcutaneous N/A Tissue) Exposed: Yes Fascia: No Tendon: No Muscle: No Joint: No Bone: No] Epithelialization: [1:None] [N/A:N/A] Debridement: [N/A:N/A] Debridement [id number]) Pre-procedure 14:56 N/A N/A Verification/Time Out Taken: Pain Control: Lidocaine 4% Topical N/A N/A Solution Tissue Debrided: Fibrin/Slough, Exudates, N/A N/A Subcutaneous Level: Skin/Subcutaneous N/A N/A Tissue Debridement Area (sq 0.7 N/A N/A cm): Instrument: Forceps N/A N/A Bleeding: Minimum N/A N/A Hemostasis Achieved: Pressure N/A N/A Procedural Pain: 0 N/A N/A Post Procedural Pain: 0 N/A N/A Debridement Treatment Procedure was tolerated N/A N/A Response: well Post Debridement 0.7x1x0.1 N/A N/A Measurements L x W x D (cm) Post Debridement 0.055 N/A N/A Volume: (cm) Periwound Skin Texture: Excoriation: Yes N/A N/A Periwound Skin No Abnormalities Noted N/A N/A Moisture: Periwound Skin Color: Erythema: Yes  N/A N/A Erythema Location: Circumferential N/A N/A Temperature: No Abnormality N/A N/A Tenderness on Yes N/A N/A Palpation: Wound Preparation: Ulcer Cleansing: N/A N/A Rinsed/Irrigated with Saline Topical Anesthetic Applied: Other: lidocaine 4% Procedures Performed: Debridement N/A N/A Treatment Notes Electronic Signature(s) Signed: 01/28/2017 3:10:43 PM By: Christin Fudge MD, FACS Entered By: Christin Fudge on 01/28/2017 [id number] Reasoner, SHANTERA RAGAINS ([id number]) ELVINE, LAUINGER ([id number]) -------------------------------------------------------------------------------- [place] Details Patient Name: STEPHINIE, JOLLIE. Date of Service: 01/28/2017 2:45 PM Medical Record Number: [id number] Patient Account Number: [id number] Date of Birth/Sex: 05-Feb-1935 (82 y.o. Female) Treating RN: Carolyne Fiscal, [name] Primary Care Marlenne Ridge: Lynett Fish Other Clinician: Referring Davanee Klinkner: Lynett Fish Treating Ephram Kornegay/Extender: Frann Rider in Treatment: 2 Active Inactive ` Nutrition Nursing Diagnoses: Imbalanced nutrition Impaired glucose control: actual or potential Potential for alteratiion in Nutrition/Potential for imbalanced nutrition Goals: Patient/caregiver agrees to and verbalizes understanding of need to use nutritional supplements and/or vitamins as prescribed Date Initiated: 01/14/2017 Target Resolution Date: 03/20/2017 Goal Status: Active Interventions: Assess patient nutrition upon admission and as needed per policy Provide education on elevated blood sugars and impact on wound healing Notes: ` Orientation to the Wound Care Program Nursing Diagnoses: Knowledge deficit related to the wound healing center program Goals: Patient/caregiver will verbalize understanding of the [facility] Date Initiated: 01/14/2017 Target Resolution Date: 02/20/2017 Goal Status: Active Interventions: Provide education on orientation to the wound  center Notes: ` Pain, Acute or Chronic Dellis, Brinly Jenetta Downer ([id number]) Nursing Diagnoses: Pain, acute or chronic: actual or potential Potential alteration in comfort, pain Goals: Patient will verbalize adequate pain control and receive pain control interventions during procedures as needed Date Initiated: 01/14/2017 Target Resolution Date: 03/20/2017 Goal Status: Active Interventions: Assess comfort goal upon admission Complete pain assessment as per visit requirements Notes: ` Wound/Skin Impairment Nursing Diagnoses: Impaired tissue integrity Knowledge deficit related to smoking impact on wound healing Knowledge deficit related to ulceration/compromised skin integrity Goals: Ulcer/skin breakdown will have a volume reduction of 80% by week 12 Date Initiated: 01/14/2017 Target Resolution Date: 03/20/2017 Goal Status: Active Interventions: Assess patient/caregiver ability to perform ulcer/skin care regimen upon admission and as needed Assess ulceration(s) every visit Provide education on smoking Notes: Electronic Signature(s) Signed: 01/29/2017 4:03:50 PM By: Alric Quan Entered By: Alric Quan on 01/28/2017 [id number] [place], Avalon. ([id number]) -------------------------------------------------------------------------------- Pain Assessment Details Patient Name: [name]. Date of Service: 01/28/2017 2:45 PM Medical Record Number: [id number] Patient Account Number: [id number] Date of Birth/Sex: [birth year] (82 y.o. Female) Treating RN: Carolyne Fiscal, [name] Primary Care Kaulin Chaves: Lynett Fish Other Clinician: Referring Morgen Ritacco: Lynett Fish Treating Leigha Olberding/Extender: Frann Rider in Treatment: 2 Active Problems Location of Pain Severity and Description of Pain Patient Has Paino  No Site Locations With Dressing Change: No Pain Management and Medication Current Pain Management: Electronic Signature(s) Signed: 01/29/2017 4:03:50 PM By: Alric Quan Entered By: Alric Quan on 01/28/2017 [id number] Harwood, [name] -------------------------------------------------------------------------------- Patient/Caregiver Education Details Patient Name: [name] Date of Service: 01/28/2017 2:45 PM Medical Record Number: [id number] Patient Account Number: [id number] Date of Birth/Gender: Jan 11, 1935 (82 y.o. Female) Treating RN: Ahmed Prima Primary Care Physician: Lynett Fish Other Clinician: Referring Physician: Lynett Fish Treating Physician/Extender: Frann Rider in Treatment: 2 Education Assessment Education Provided To: Patient Education Topics Provided Wound/Skin Impairment: Handouts: Other: change dressing as ordered Methods: Demonstration, Explain/Verbal Responses: State content correctly Electronic Signature(s) Signed: 01/29/2017 4:03:50 PM By: Alric Quan Entered By: Alric Quan on 01/28/2017 [id number] Carfagno, Chaselyn Jenetta Downer ([id number]) -------------------------------------------------------------------------------- Wound Assessment Details Patient Name: Bischof, [name]. Date of Service: 01/28/2017 2:45 PM Medical Record Number: [id number] Patient Account Number: [id number] Date of Birth/Sex: 09-01-1935 (82 y.o. Female) Treating RN: Carolyne Fiscal, [name] Primary Care Srija Southard: Lynett Fish Other Clinician: Referring Ranen Doolin: Lynett Fish Treating Haniyah Maciolek/Extender: Frann Rider in Treatment: 2 Wound Status Wound Number: 1 Primary Diabetic Wound/Ulcer of the Lower Etiology: Extremity Wound Location: Right Lower Leg - Posterior Secondary Trauma, Other Wounding Event: Trauma Etiology: Date Acquired: 12/17/2016 Wound Open Weeks Of Treatment: 2 Status: Clustered Wound: No Comorbid Cataracts, Anemia, Chronic History: Obstructive Pulmonary Disease (COPD), Type II Diabetes, Received Radiation Photos Photo Uploaded By: Alric Quan on 01/28/2017  16:12:53 Wound Measurements Length: (cm) 0.7 Width: (cm) 1 Depth: (cm) 0.1 Area: (cm) 0.55 Volume: (cm) 0.055 % Reduction in Area: 78.1% % Reduction in Volume: 78.1% Epithelialization: None Tunneling: No Undermining: No Wound Description Classification: Grade 2 Foul Odor Aft Wound Margin: Distinct, outline attached Slough/Fibrin Exudate Amount: Large Exudate Type: Serosanguineous Exudate Color: red, brown er Cleansing: No o No Wound Bed Granulation Amount: None Present (0%) Exposed Structure Pendelton, Makela O. ([id number]) Necrotic Amount: Large (67-100%) Fascia Exposed: No Necrotic Quality: Eschar, Adherent Slough Fat Layer (Subcutaneous Tissue) Exposed: Yes Tendon Exposed: No Muscle Exposed: No Joint Exposed: No Bone Exposed: No Periwound Skin Texture Texture Color No Abnormalities Noted: No No Abnormalities Noted: No Excoriation: Yes Erythema: Yes Erythema Location: Circumferential Moisture No Abnormalities Noted: No Temperature / Pain Temperature: No Abnormality Tenderness on Palpation: Yes Wound Preparation Ulcer Cleansing: Rinsed/Irrigated with Saline Topical Anesthetic Applied: Other: lidocaine 4%, Treatment Notes Wound #1 (Right, Posterior Lower Leg) 1. Cleansed with: Clean wound with Normal Saline 2. Anesthetic Topical Lidocaine 4% cream to wound bed prior to debridement 4. Dressing Applied: Hydrafera Blue 5. Secondary [place] Signature(s) Signed: 01/29/2017 4:03:50 PM By: Alric Quan Entered By: Alric Quan on 01/28/2017 [id number] Schmieg, [name] -------------------------------------------------------------------------------- [place] Details Patient Name: [name] Date of Service: 01/28/2017 2:45 PM Medical Record Number: [id number] Patient Account Number: [id number] Date of Birth/Sex: [birth year] (82 y.o. Female) Treating RN: Carolyne Fiscal, [name] Primary Care Neyla Gauntt: Lynett Fish Other Clinician: Referring Rilley Poulter: Lynett Fish Treating Ladawna Walgren/Extender: Frann Rider in Treatment: 2 Vital Signs Time Taken: 14:48 Temperature (F): 98.6 Height (in): 64 Pulse (bpm): 82 Weight (lbs): 142.7 Respiratory Rate (breaths/min): 18 Body Mass Index (BMI): 24.5 Blood Pressure (mmHg): 144/64 Reference Range: 80 - 120 mg / dl Electronic Signature(s) Signed: 01/29/2017 4:03:50 PM By: Alric Quan Entered By: Alric Quan on 01/28/2017 15:14:55

## 2017-01-30 NOTE — Progress Notes (Signed)
GIA, SEES ([id number]) Visit Report for 01/28/2017 Chief Complaint Document Details Patient Name: [name], [name]. Date of Service: 01/28/2017 2:45 PM Medical Record Number: [id number] Patient Account Number: [id number] Date of Birth/Sex: [birth year] (82 y.o. Female) Treating RN: Carolyne Fiscal, [name] Primary Care Provider: Lynett Fish Other Clinician: Referring Provider: Lynett Fish Treating Provider/Extender: Frann Rider in Treatment: 2 Information Obtained from: Patient Chief Complaint Patients presents for treatment of an open diabetic ulcer to the right lower extremity which is had for about a month Electronic Signature(s) Signed: 01/28/2017 3:11:32 PM By: Christin Fudge MD, FACS Entered By: Christin Fudge on 01/28/2017 0000000 [name], [name] -------------------------------------------------------------------------------- Debridement Details Patient Name: [name]. Date of Service: 01/28/2017 2:45 PM Medical Record Number: [id number] Patient Account Number: [id number] Date of Birth/Sex: January 21, 1935 (82 y.o. Female) Treating RN: Carolyne Fiscal, [name] Primary Care Provider: Lynett Fish Other Clinician: Referring Provider: Lynett Fish Treating Provider/Extender: Frann Rider in Treatment: 2 Debridement Performed for Wound #1 Right,Posterior Lower Leg Assessment: Performed By: Physician Christin Fudge, MD Debridement: Debridement Pre-procedure Yes - 14:56 Verification/Time Out Taken: Start Time: 14:57 Pain Control: Lidocaine 4% Topical Solution Level: Skin/Subcutaneous Tissue Total Area Debrided (L x 0.7 (cm) x 1 (cm) = 0.7 (cm) W): Tissue and other Viable, Non-Viable, Exudate, Fibrin/Slough, Subcutaneous material debrided: Instrument: Forceps Bleeding: Minimum Hemostasis Achieved: Pressure End Time: 15:00 Procedural Pain: 0 Post Procedural Pain: 0 Response to Treatment: Procedure was tolerated well Post Debridement  Measurements of Total Wound Length: (cm) 0.7 Width: (cm) 1 Depth: (cm) 0.1 Volume: (cm) 0.055 Character of Wound/Ulcer Post Requires Further Debridement Debridement: Severity of Tissue Post Debridement: Fat layer exposed Post Procedure Diagnosis Same as Pre-procedure Electronic Signature(s) Signed: 01/28/2017 3:11:24 PM By: Christin Fudge MD, FACS Signed: 01/29/2017 4:03:50 PM By: Alric Quan Previous Signature: 01/28/2017 3:11:11 PM Version By: Christin Fudge MD, FACS Entered By: Christin Fudge on 01/28/2017 [id number] [name], [name] [name], [name] -------------------------------------------------------------------------------- HPI Details Patient Name: [name], [name] Date of Service: 01/28/2017 2:45 PM Medical Record Number: [id number] Patient Account Number: [id number] Date of Birth/Sex: 1935-01-05 (82 y.o. Female) Treating RN: Carolyne Fiscal, [name] Primary Care Provider: Lynett Fish Other Clinician: Referring Provider: Lynett Fish Treating Provider/Extender: Frann Rider in Treatment: 2 History of Present Illness Location: right lower posterior calf Quality: Patient reports experiencing a sharp pain to affected area(s). Severity: Patient states wound are getting worse. Duration: Patient has had the wound for < 4 weeks prior to presenting for treatment Timing: Pain in wound is Intermittent (comes and goes Context: The wound occurred when the patient hit the back of her leg against a blunt object at home which opened out a wound Modifying Factors: Other treatment(s) tried include:Keflex and doxycycline by mouth Associated Signs and Symptoms: Patient reports having increase swelling. HPI Description: 82 year old patient who is known to have diabetes mellitus and tobacco abuse has had a injury to the right posterior calf about a month ago. He has been treated with local care and 2 courses of antibiotics which include Keflex and  doxycycline which she has completed. Smokes about a pack and a half of cigarettes a day and has a past medical history of COPD, diabetes mellitus, hyperlipidemia,psoriasis and hypothyroidism. 01/21/2017 -- she is still working on giving up smoking but is not inclined to do so. 01/28/2017 -- the patient is rather noncompliant and has little desire to follow instructions. Electronic Signature(s) Signed: 01/28/2017 3:12:03 PM By: Christin Fudge MD, FACS Entered By: Con Memos  Elisabetta Mishra on 01/28/2017 [id number] [name], [name] -------------------------------------------------------------------------------- Physical Exam Details Patient Name: [name], COSTE. Date of Service: 01/28/2017 2:45 PM Medical Record Number: [id number] Patient Account Number: [id number] Date of Birth/Sex: [birth year] (82 y.o. Female) Treating RN: Carolyne Fiscal, [name] Primary Care Provider: Lynett Fish Other Clinician: Referring Provider: Lynett Fish Treating Provider/Extender: Frann Rider in Treatment: 2 Constitutional . Pulse regular. Respirations normal and unlabored. Afebrile. . Eyes Nonicteric. Reactive to light. Ears, Nose, Mouth, and Throat Lips, teeth, and gums WNL.Marland Kitchen Moist mucosa without lesions. Neck supple and nontender. No palpable supraclavicular or cervical adenopathy. Normal sized without goiter. Respiratory WNL. No retractions.. Breath sounds WNL, No rubs, rales, rhonchi, or wheeze.. Cardiovascular Heart rhythm and rate regular, no murmur or gallop.. Pedal Pulses WNL. No clubbing, cyanosis or edema. Chest Breasts symmetical and no nipple discharge.. Breast tissue WNL, no masses, lumps, or tenderness.. Lymphatic No adneopathy. No adenopathy. No adenopathy. Musculoskeletal Adexa without tenderness or enlargement.. Digits and nails w/o clubbing, cyanosis, infection, petechiae, ischemia, or inflammatory conditions.. Integumentary (Hair, Skin) No suspicious lesions. No crepitus or  fluctuance. No peri-wound warmth or erythema. No masses.Marland Kitchen Psychiatric Judgement and insight Intact.. No evidence of depression, anxiety, or agitation.. Notes she continues to have a lot of lymphedema still and the wound has a lot of eschar and subcutaneous debris and using a tooth forcep I have removed as much sharply as possible but due to limitations of pain I have stopped. Electronic Signature(s) Signed: 01/28/2017 3:12:44 PM By: Christin Fudge MD, FACS Entered By: Christin Fudge on 01/28/2017 [id number] [name], [name] -------------------------------------------------------------------------------- Physician Orders Details Patient Name: [name] Date of Service: 01/28/2017 2:45 PM Medical Record Number: [id number] Patient Account Number: [id number] Date of Birth/Sex: 10-25-1935 (82 y.o. Female) Treating RN: Carolyne Fiscal, [name] Primary Care Provider: Lynett Fish Other Clinician: Referring Provider: Lynett Fish Treating Provider/Extender: Frann Rider in Treatment: 2 Verbal / Phone Orders: Yes Clinician: Pinkerton, [name] Read Back and Verified: Yes Diagnosis Coding Wound Cleansing Wound #1 Right,Posterior Lower Leg o Clean wound with Normal Saline. o Cleanse wound with mild soap and water o May Shower, gently pat wound dry prior to applying new dressing. Anesthetic Wound #1 Right,Posterior Lower Leg o Topical Lidocaine 4% cream applied to wound bed prior to debridement Skin Barriers/Peri-Wound Care Wound #1 Right,Posterior Lower Leg o Skin Prep Primary Wound Dressing Wound #1 Right,Posterior Lower Leg o Hydrafera Blue Secondary Dressing Wound #1 Right,Posterior Lower Leg o Non-adherent pad - telfa island Dressing Change Frequency Wound #1 Right,Posterior Lower Leg o Change dressing every other day. Follow-up Appointments Wound #1 Right,Posterior Lower Leg o Return Appointment in 1 week. Edema Control Wound #1  Right,Posterior Lower Leg o Elevate legs to the level of the heart and pump ankles as often as possible o Support Garment 10-20 mm/Hg pressure to: - Patient to pick up 15-49mmhg stockings for her right leg. [name], [name] Additional Orders / Instructions Wound #1 Right,Posterior Lower Leg o Stop Smoking o Increase protein intake. Medications-please add to medication list. Wound #1 Right,Posterior Lower Leg o Other: - vitamin c, zinc, MVI Electronic Signature(s) Signed: 01/28/2017 4:07:08 PM By: Christin Fudge MD, FACS Entered By: Christin Fudge on 01/28/2017 [id number] [name], [name] -------------------------------------------------------------------------------- Problem List Details Patient Name: Lang[place], Rashunda O. Date of Service: 01/28/2017 2:45 PM Medical Record Number: [id number] Patient Account Number: [id number] Date of Birth/Sex: [birth year] (82 y.o. 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 Date of Service: 01/28/2017 2:45 PM Medical Record Number: [id number] Patient Account Number:  [id number] Date of Birth/Sex: [birth year] (82 y.o. Female) Treating RN: Carolyne Fiscal, [name] Primary Care Provider: Lynett Fish Other Clinician: Referring Provider: Lynett Fish Treating Provider/Extender: Frann Rider in Treatment: 2 Subjective Chief Complaint Information obtained from Patient Patients presents for treatment of an open diabetic ulcer to the right lower extremity which is had for about a month History of Present Illness (HPI) The following HPI elements were documented for the patient's wound: Location: right lower posterior calf Quality: Patient reports experiencing a sharp pain to affected area(s). Severity: Patient states wound are getting worse. Duration: Patient has had the wound for < 4 weeks prior to presenting for treatment Timing: Pain in wound is Intermittent (comes and goes Context: The wound occurred when the patient hit the back of her leg against a blunt object at home which opened out a wound Modifying Factors: Other treatment(s) tried include:Keflex and doxycycline by mouth Associated Signs and Symptoms: Patient reports having increase swelling. 82 year old patient who is known to have diabetes mellitus and tobacco abuse has had a injury to the right posterior calf about a month ago. He has been treated with local care and 2 courses of antibiotics which include Keflex and doxycycline which she has completed. Smokes about a pack and a half of cigarettes a day and has a past medical history of COPD, diabetes mellitus, hyperlipidemia,psoriasis and hypothyroidism. 01/21/2017 -- she is still working on giving up smoking but is not inclined to do so. 01/28/2017 -- the patient is rather noncompliant and has little desire to follow instructions. Objective Constitutional Pulse regular. Respirations normal and unlabored. Afebrile. Vitals Time Taken: 2:48 PM, Height: 64 in, Weight: 142.7 lbs, BMI: 24.5, Temperature: 98.6 F, Pulse: 82 bpm, Respiratory  Rate: 18 breaths/min, Blood Pressure: 144/64 mmHg. ULYSSES, DEWAARD ([id number]) Eyes Nonicteric. Reactive to light. Ears, Nose, Mouth, and Throat Lips, teeth, and gums WNL.Marland Kitchen Moist mucosa without lesions. Neck supple and nontender. No palpable supraclavicular or cervical adenopathy. Normal sized without goiter. Respiratory WNL. No retractions.. Breath sounds WNL, No rubs, rales, rhonchi, or wheeze.. Cardiovascular Heart rhythm and rate regular, no murmur or gallop.. Pedal Pulses WNL. No clubbing, cyanosis or edema. Chest Breasts symmetical and no nipple discharge.. Breast tissue WNL, no masses, lumps, or tenderness.. Lymphatic No adneopathy. No adenopathy. No adenopathy. Musculoskeletal Adexa without tenderness or enlargement.. Digits and nails w/o clubbing, cyanosis, infection, petechiae, ischemia, or inflammatory conditions.Marland Kitchen Psychiatric Judgement and insight Intact.. No evidence of depression, anxiety, or agitation.. General Notes: she continues to have a lot of lymphedema still and the wound has a lot of eschar and subcutaneous debris and using a tooth forcep I have removed as much sharply as possible but due to limitations of pain I have stopped. Integumentary (Hair, Skin) No suspicious lesions. No crepitus or fluctuance. No peri-wound warmth or erythema. No masses.. Wound #1 status is Open.  Original cause of wound was Trauma. The wound is located on the Right,Posterior Lower Leg. The wound measures 0.7cm length x 1cm width x 0.1cm depth; 0.55cm^2 area and 0.055cm^3 volume. There is Fat Layer (Subcutaneous Tissue) Exposed exposed. There is no tunneling or undermining noted. There is a large amount of serosanguineous drainage noted. The wound margin is distinct with the outline attached to the wound base. There is no granulation within the wound bed. There is a large (67-100%) amount of necrotic tissue within the wound bed including Eschar and Adherent Slough. The periwound skin  appearance exhibited: Excoriation, Erythema. The surrounding wound skin color is noted with erythema which is circumferential. Periwound temperature was noted as No Abnormality. The periwound has tenderness on palpation. 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 There was a Skin/Subcutaneous Tissue Debridement [id number]) debridement with total area of 0.7 sq cm performed by Christin Fudge, MD. with the following instrument(s): Forceps to remove Viable and Non- Viable tissue/material including Exudate, Fibrin/Slough, and Subcutaneous after achieving pain control using Lidocaine 4% Topical Solution. A time out was conducted at 14:56, prior to the start of the procedure. A Minimum amount of bleeding was controlled with Pressure. The procedure was tolerated well with a pain level of 0 throughout and a pain level of 0 following the procedure. Post Debridement Measurements: 0.7cm length x 1cm width x 0.1cm depth; 0.055cm^3 volume. Character of Wound/Ulcer Post Debridement requires further debridement. Severity of Tissue Post Debridement is: Fat layer exposed. Post procedure Diagnosis Wound #1: Same as Pre-Procedure Plan Wound Cleansing: Wound #1 Right,Posterior Lower Leg: Clean wound with Normal Saline. Cleanse wound with mild soap and water May Shower, gently pat wound dry prior to applying new dressing. Anesthetic: Wound #1 Right,Posterior Lower Leg: Topical Lidocaine 4% cream applied to wound bed prior to debridement Skin Barriers/Peri-Wound Care: Wound #1 Right,Posterior Lower Leg: Skin  Prep Primary Wound Dressing: Wound #1 Right,Posterior Lower Leg: Hydrafera Blue Allender, Chabely OMarland Kitchen ([id number]) Secondary Dressing: Wound #1 Right,Posterior Lower Leg: Non-adherent pad - telfa island Dressing Change Frequency: Wound #1 Right,Posterior Lower Leg: Change dressing every other day. Follow-up Appointments: Wound #1 Right,Posterior Lower Leg: Return Appointment in 1 week. Edema Control: Wound #1 Right,Posterior Lower Leg: Elevate legs to the level of the heart and pump ankles as often as possible Support Garment 10-20 mm/Hg pressure to: - Patient to pick up 15-80mmhg stockings for her right leg. Additional Orders / Instructions: Wound #1 Right,Posterior Lower Leg: Stop Smoking Increase protein intake. Medications-please add to medication list.: Wound #1 Right,Posterior Lower Leg: Other: - vitamin c, zinc, MVI After review I have recommended: 1. Hydrofera blue with a bordered foam to be changed every other day 2. Elevation and exercise 3. she is unable to wear her compression stockings and refuses to try to do this 4. Good control of her diabetes mellitus 5. Adequate protein, vitamin A, vitamin C and zinc She has had all questions answered and said she will be compliant Electronic Signature(s) Signed: 01/28/2017 4:10:20 PM By: Christin Fudge MD, FACS Previous Signature: 01/28/2017 3:14:25 PM Version By: Christin Fudge MD, FACS Entered By: Christin Fudge on 01/28/2017 [id number] Freda, Lorina Jenetta [name] ([id number]) -------------------------------------------------------------------------------- SuperBill  Details Patient Name: DOIS, GENTRY. Date of Service: 01/28/2017 Medical Record Number: [id number] Patient Account Number: [id number] Date of Birth/Sex: 05-03-1935 (82 y.o. Female) Treating RN: Carolyne Fiscal, [name] Primary Care Provider: Lynett Fish Other Clinician: Referring Provider: Lynett Fish Treating Provider/Extender: Christin Fudge Service Line: Outpatient Weeks in  Treatment: 2 Diagnosis Coding ICD-10 Codes Code Description [id number] Type 2 diabetes mellitus with other skin ulcer L97.212 Non-pressure chronic ulcer of right calf with fat layer exposed I89.0 Lymphedema, not elsewhere classified F17.218 Nicotine dependence, cigarettes, with other nicotine-induced disorders Facility Procedures CPT4 Code Description: [id number] - DEB SUBQ TISSUE 20 SQ CM/< ICD-10 Description Diagnosis E11.622 Type 2 diabetes mellitus with other skin ulcer L97.212 Non-pressure chronic ulcer of right calf with fat l I89.0 Lymphedema, not elsewhere classified  F17.218 Nicotine dependence, cigarettes, with other nicotin Modifier: ayer exposed e-induced di Quantity: 1 sorders Physician Procedures CPT4 Code Description: [id number] - WC PHYS SUBQ TISS 20 SQ CM ICD-10 Description Diagnosis E11.622 Type 2 diabetes mellitus with other skin ulcer L97.212 Non-pressure chronic ulcer of right calf with fat l I89.0 Lymphedema, not elsewhere classified  F17.218 Nicotine dependence, cigarettes, with other nicotin Modifier: ayer exposed e-induced di Quantity: 1 sorders Electronic Signature(s) Signed: 01/28/2017 4:18:58 PM By: Christin Fudge MD, FACS Previous Signature: 01/28/2017 3:37:49 PM Version By: Christin Fudge MD, FACS Entered By: Christin Fudge on 01/28/2017 16:18:57

## 2017-02-04 ENCOUNTER — Encounter: Payer: Medicare Other | Admitting: Surgery

## 2017-02-04 DIAGNOSIS — E11622 Type 2 diabetes mellitus with other skin ulcer: Secondary | ICD-10-CM | POA: Diagnosis not present

## 2017-02-06 NOTE — Progress Notes (Signed)
[name] Visit Report for 02/04/2017 Arrival Information Details Patient Name: [name], [name]. Date of Service: 02/04/2017 3:30 PM Medical Record Number: [id number] Patient Account Number: [id number] Date of Birth/Sex: 1934-12-16 (82 y.o. Female) Treating RN: Carolyne Fiscal, [name] Primary Care Mychele Seyller: Lynett Fish Other Clinician: Referring Annette Bertelson: Lynett Fish Treating Amyri Frenz/Extender: Frann Rider in Treatment: 3 Visit Information History Since Last Visit All ordered tests and consults were completed: No Patient Arrived: Ambulatory Added or deleted any medications: No Arrival Time: 15:33 Any new allergies or adverse reactions: No Accompanied By: self Had a fall or experienced change in No Transfer Assistance: None activities of daily living that may affect Patient Identification Verified: Yes risk of falls: Secondary Verification Process Yes Signs or symptoms of abuse/neglect since last No Completed: visito Patient Requires Transmission-Based No Hospitalized since last visit: No Precautions: Has Dressing in Place as Prescribed: Yes Patient Has Alerts: Yes Has Compression in Place as Prescribed: Yes Patient Alerts: DM II Pain Present Now: No Electronic Signature(s) Signed: 02/05/2017 4:53:33 PM By: Alric Quan Entered By: Alric Quan on 02/04/2017 [id number] [place], [name] -------------------------------------------------------------------------------- Clinic Level of Care Assessment Details Patient Name: [name], [name]. Date of Service: 02/04/2017 3:30 PM Medical Record Number: [id number] Patient Account Number: [id number] Date of Birth/Sex: 28-Mar-1935 (83 y.o. Female) Treating RN: Carolyne Fiscal, [name] Primary Care Serafin [place]: Lynett Fish Other Clinician: Referring Zakaria Fromer: Lynett Fish Treating Laure Leone/Extender: Frann Rider in Treatment: 3 Clinic Level of Care Assessment Items TOOL 4 Quantity  Score X - Use when only an EandM is performed on FOLLOW-UP visit 1 0 ASSESSMENTS - Nursing Assessment / Reassessment X - Reassessment of Co-morbidities (includes updates in patient status) 1 10 X - Reassessment of Adherence to Treatment Plan 1 5 ASSESSMENTS - Wound and Skin Assessment / Reassessment X - Simple Wound Assessment / Reassessment - one wound 1 5 []  - Complex Wound Assessment / Reassessment - multiple wounds 0 []  - Dermatologic / Skin Assessment (not related to wound area) 0 ASSESSMENTS - Focused Assessment []  - Circumferential Edema Measurements - multi extremities 0 []  - Nutritional Assessment / Counseling / Intervention 0 []  - Lower Extremity Assessment (monofilament, tuning fork, pulses) 0 []  - Peripheral Arterial Disease Assessment (using hand held doppler) 0 ASSESSMENTS - Ostomy and/or Continence Assessment and Care []  - Incontinence Assessment and Management 0 []  - Ostomy Care Assessment and Management (repouching, etc.) 0 PROCESS - Coordination of Care X - Simple Patient / Family Education for ongoing care 1 15 []  - Complex (extensive) Patient / Family Education for ongoing care 0 []  - Staff obtains Programmer, systems, Records, Test Results / Process Orders 0 []  - Staff telephones HHA, Nursing Homes / Clarify orders / etc 0 []  - Routine Transfer to another Facility (non-emergent condition) 0 [name], [name] ([id number]) []  - Routine Hospital Admission (non-emergent condition) 0 []  - New Admissions / Insurance Authorizations / Ordering NPWT, Apligraf, etc. 0 []  - Emergency Hospital Admission (emergent condition) 0 X - Simple Discharge Coordination 1 10 []  - Complex (extensive) Discharge Coordination 0 PROCESS - Special Needs []  - Pediatric / Minor Patient Management 0 []  - Isolation Patient Management 0 []  - Hearing / Language / Visual special needs 0 []  - Assessment of Community assistance (transportation, D/C planning, etc.) 0 []  - Additional assistance / Altered mentation  0 []  - Support Surface(s) Assessment (bed, cushion, seat, etc.) 0 INTERVENTIONS - Wound Cleansing / Measurement X - Simple Wound Cleansing - one wound 1 5 []  -  Complex Wound Cleansing - multiple wounds 0 X - Wound Imaging (photographs - any number of wounds) 1 5 []  - Wound Tracing (instead of photographs) 0 X - Simple Wound Measurement - one wound 1 5 []  - Complex Wound Measurement - multiple wounds 0 INTERVENTIONS - Wound Dressings X - Small Wound Dressing one or multiple wounds 1 10 []  - Medium Wound Dressing one or multiple wounds 0 []  - Large Wound Dressing one or multiple wounds 0 X - Application of Medications - topical 1 5 []  - Application of Medications - injection 0 INTERVENTIONS - Miscellaneous []  - External ear exam 0 [name], [name] ([id number]) []  - Specimen Collection (cultures, biopsies, blood, body fluids, etc.) 0 []  - Specimen(s) / Culture(s) sent or taken to Lab for analysis 0 []  - Patient Transfer (multiple staff / Harrel Lemon Lift / Similar devices) 0 []  - Simple Staple / Suture removal (25 or less) 0 []  - Complex Staple / Suture removal (26 or more) 0 []  - Hypo / Hyperglycemic Management (close monitor of Blood Glucose) 0 []  - Ankle / Brachial Index (ABI) - do not check if billed separately 0 X - Vital Signs 1 5 Has the patient been seen at the hospital within the last three years: Yes Total Score: 80 Level Of Care: New/Established - Level 3 Electronic Signature(s) Signed: 02/05/2017 4:53:33 PM By: Alric Quan Entered By: Alric Quan on 02/04/2017 [id number] [name], [name] -------------------------------------------------------------------------------- Encounter Discharge Information Details Patient Name: [name]. Date of Service: 02/04/2017 3:30 PM Medical Record Number: [id number] Patient Account Number: [id number] Date of Birth/Sex: 31-Mar-1935 (82 y.o. Female) Treating RN: Carolyne Fiscal, [name] Primary Care Styles Fambro: Lynett Fish Other Clinician: Referring Phuc Kluttz: Lynett Fish Treating Dontavion Noxon/Extender: Frann Rider in Treatment: 3 Encounter Discharge Information Items Discharge Pain Level: 0 Discharge Condition: Stable Ambulatory Status: Ambulatory Discharge Destination: Home Transportation: Private Auto Accompanied By: self Schedule Follow-up Appointment: Yes Medication Reconciliation completed and provided to Patient/Care No Hajar Penninger: Provided on Clinical Summary of Care: 02/04/2017 Form Type Recipient Paper Patient AM Electronic Signature(s) Signed: 02/04/2017 4:06:01 PM By: Ruthine Dose Entered By: Ruthine Dose on 02/04/2017 [id number] [name], [name] -------------------------------------------------------------------------------- Lower Extremity Assessment Details Patient Name: [name], [name]. Date of Service: 02/04/2017 3:30 PM Medical Record Number: [id number] Patient Account Number: [id number] Date of Birth/Sex: 1935-09-06 (82 y.o. Female) Treating RN: Carolyne Fiscal, [name] Primary Care Thomes Burak: Lynett Fish Other Clinician: Referring Faizan Geraci: Lynett Fish Treating Cieanna Stormes/Extender: Frann Rider in Treatment: 3 Vascular Assessment Pulses: Dorsalis Pedis Palpable: [Right:Yes] Posterior Tibial Extremity colors, hair growth, and conditions: Extremity Color: [Right:Mottled] Temperature of Extremity: [Right:Warm] Capillary Refill: [Right:< 3 seconds] Electronic Signature(s) Signed: 02/05/2017 4:53:33 PM By: Alric Quan Entered By: Alric Quan on 02/04/2017 [id number] Rattigan, Johnay Jenetta [name] ([id number]) -------------------------------------------------------------------------------- Multi Wound Chart Details Patient Name: [name], [name]. Date of Service: 02/04/2017 3:30 PM Medical Record Number: [id number] Patient Account Number: [id number] Date of Birth/Sex: [birth year] (82 y.o. Female) Treating RN: Carolyne Fiscal, [name] Primary Care Marcheta Horsey: Lynett Fish Other Clinician: Referring Saidah Kempton: Lynett Fish Treating Janat Tabbert/Extender: Frann Rider in Treatment: 3 Vital Signs Height(in): 64 Pulse(bpm): 96 Weight(lbs): 142.7 Blood Pressure 157/56 (mmHg): Body Mass Index(BMI): 24 Temperature(F): 97.7 Respiratory Rate 18 (breaths/min): Photos: [N/A:N/A] Wound Location: Right Lower Leg - N/A N/A Posterior Wounding Event: Trauma N/A N/A Primary Etiology: Diabetic Wound/Ulcer of N/A N/A the Lower Extremity Secondary Etiology: Trauma, Other N/A N/A Comorbid History: Cataracts, Anemia, N/A N/A Chronic Obstructive Pulmonary Disease (COPD), Type II  Diabetes, Received Radiation Date Acquired: 12/17/2016 N/A N/A Weeks of Treatment: 3 N/A N/A Wound Status: Open N/A N/A Measurements L x W x D 0.8x1x0.1 N/A N/A (cm) Area (cm) : 0.628 N/A N/A Volume (cm) : 0.063 N/A N/A % Reduction in Area: 75.00% N/A N/A % Reduction in Volume: 74.90% N/A N/A Classification: Grade 2 N/A N/A Exudate Amount: Large N/A N/A Exudate Type: Serosanguineous N/A N/A [name], [name] ([id number]) Exudate Color: red, brown N/A N/A Wound Margin: Distinct, outline attached N/A N/A Granulation Amount: None Present (0%) N/A N/A Necrotic Amount: Large (67-100%) N/A N/A Exposed Structures: Fat Layer (Subcutaneous N/A N/A Tissue) Exposed: Yes Fascia: No Tendon: No Muscle: No Joint: No Bone: No Epithelialization: None N/A N/A Periwound Skin Texture: Excoriation: Yes N/A N/A Periwound Skin No Abnormalities Noted N/A N/A Moisture: Periwound Skin Color: Erythema: Yes N/A N/A Erythema Location: Circumferential N/A N/A Temperature: No Abnormality N/A N/A Tenderness on Yes N/A N/A Palpation: Wound Preparation: Ulcer Cleansing: N/A N/A Rinsed/Irrigated with Saline Topical Anesthetic Applied: Other: lidocaine 4% Treatment Notes Wound #1 (Right, Posterior Lower Leg) 1. Cleansed with: Clean wound with Normal Saline 2. Anesthetic Topical  Lidocaine 4% cream to wound bed prior to debridement 4. Dressing Applied: Medihoney Gel 5. Secondary Dressing Applied Dry [place] Signature(s) Signed: 02/04/2017 4:26:32 PM By: Christin Fudge MD, FACS Entered By: Christin Fudge on 02/04/2017 [id number] [place], [name] -------------------------------------------------------------------------------- [place] Details Patient Name: [name], BARROIS. Date of Service: 02/04/2017 3:30 PM Medical Record Number: [id number] Patient Account Number: [id number] Date of Birth/Sex: [birth year] (82 y.o. Female) Treating RN: Carolyne Fiscal, [name] Primary Care Shadara Lopez: Lynett Fish Other Clinician: Referring Ronaldo Crilly: Lynett Fish Treating Neola Worrall/Extender: Frann Rider in Treatment: 3 Active Inactive ` Nutrition Nursing Diagnoses: Imbalanced nutrition Impaired glucose control: actual or potential Potential for alteratiion in Nutrition/Potential for imbalanced nutrition Goals: Patient/caregiver agrees to and verbalizes understanding of need to use nutritional supplements and/or vitamins as prescribed Date Initiated: 01/14/2017 Target Resolution Date: 03/20/2017 Goal Status: Active Interventions: Assess patient nutrition upon admission and as needed per policy Provide education on elevated blood sugars and impact on wound healing Notes: ` Orientation to the Wound Care Program Nursing Diagnoses: Knowledge deficit related to the wound healing center program Goals: Patient/caregiver will verbalize understanding of the [place] Program Date Initiated: 01/14/2017 Target Resolution Date: 02/20/2017 Goal Status: Active Interventions: Provide education on orientation to the wound center Notes: ` Pain, Acute or Chronic [name], Deborah Jenetta [name] ([id number]) Nursing Diagnoses: Pain, acute or chronic: actual or potential Potential alteration in comfort, pain Goals: Patient will verbalize  adequate pain control and receive pain control interventions during procedures as needed Date Initiated: 01/14/2017 Target Resolution Date: 03/20/2017 Goal Status: Active Interventions: Assess comfort goal upon admission Complete pain assessment as per visit requirements Notes: ` Wound/Skin Impairment Nursing Diagnoses: Impaired tissue integrity Knowledge deficit related to smoking impact on wound healing Knowledge deficit related to ulceration/compromised skin integrity Goals: Ulcer/skin breakdown will have a volume reduction of 80% by week 12 Date Initiated: 01/14/2017 Target Resolution Date: 03/20/2017 Goal Status: Active Interventions: Assess patient/caregiver ability to perform ulcer/skin care regimen upon admission and as needed Assess ulceration(s) every visit Provide education on smoking Notes: Electronic Signature(s) Signed: 02/05/2017 4:53:33 PM By: Alric Quan Entered By: Alric Quan on 02/04/2017 [id number] [place], [place]. ([id number]) -------------------------------------------------------------------------------- Pain Assessment Details Patient Name: [name]. Date of Service: 02/04/2017 3:30 PM Medical Record Number: [id number] Patient Account Number: [id number] Date of Birth/Sex: 1935-11-09 (81  y.o. Female) Treating RN: Carolyne Fiscal, [name] Primary Care Malike Foglio: Lynett Fish Other Clinician: Referring Auriah Hollings: Lynett Fish Treating Naje Rice/Extender: Frann Rider in Treatment: 3 Active Problems Location of Pain Severity and Description of Pain Patient Has Paino No Site Locations With Dressing Change: No Pain Management and Medication Current Pain Management: Electronic Signature(s) Signed: 02/05/2017 4:53:33 PM By: Alric Quan Entered By: Alric Quan on 02/04/2017 [id number] [place], [place]. ([id number]) -------------------------------------------------------------------------------- Patient/Caregiver Education Details Patient  Name: [name] Date of Service: 02/04/2017 3:30 PM Medical Record Number: [id number] Patient Account Number: [id number] Date of Birth/Gender: 03-30-1935 (82 y.o. Female) Treating RN: Ahmed Prima Primary Care Physician: Lynett Fish Other Clinician: Referring Physician: Lynett Fish Treating Physician/Extender: Frann Rider in Treatment: 3 Education Assessment Education Provided To: Patient Education Topics Provided Wound/Skin Impairment: Handouts: Other: change dressing as ordered Methods: Demonstration, Explain/Verbal Responses: State content correctly Electronic Signature(s) Signed: 02/05/2017 4:53:33 PM By: Alric Quan Entered By: Alric Quan on 02/04/2017 [id number] Reier, Annamary Jenetta [name] ([id number]) -------------------------------------------------------------------------------- Wound Assessment Details Patient Name: Perkovich, [name]. Date of Service: 02/04/2017 3:30 PM Medical Record Number: [id number] Patient Account Number: [id number] Date of Birth/Sex: 1935-08-02 (82 y.o. Female) Treating RN: Carolyne Fiscal, [name] Primary Care Sacheen Arrasmith: Lynett Fish Other Clinician: Referring Durward Matranga: Lynett Fish Treating Raygen Dahm/Extender: Frann Rider in Treatment: 3 Wound Status Wound Number: 1 Primary Diabetic Wound/Ulcer of the Lower Etiology: Extremity Wound Location: Right Lower Leg - Posterior Secondary Trauma, Other Wounding Event: Trauma Etiology: Date Acquired: 12/17/2016 Wound Open Weeks Of Treatment: 3 Status: Clustered Wound: No Comorbid Cataracts, Anemia, Chronic History: Obstructive Pulmonary Disease (COPD), Type II Diabetes, Received Radiation Photos Photo Uploaded By: Alric Quan on 02/04/2017 16:12:12 Wound Measurements Length: (cm) 0.8 Width: (cm) 1 Depth: (cm) 0.1 Area: (cm) 0.628 Volume: (cm) 0.063 % Reduction in Area: 75% % Reduction in Volume: 74.9% Epithelialization: None Tunneling: No Undermining:  No Wound Description Classification: Grade 2 Foul Odor Aft Wound Margin: Distinct, outline attached Slough/Fibrin Exudate Amount: Large Exudate Type: Serosanguineous Exudate Color: red, brown er Cleansing: No o No Wound Bed Granulation Amount: None Present (0%) Exposed Structure Baham, Aasia O. ([id number]) Necrotic Amount: Large (67-100%) Fascia Exposed: No Necrotic Quality: Adherent Slough Fat Layer (Subcutaneous Tissue) Exposed: Yes Tendon Exposed: No Muscle Exposed: No Joint Exposed: No Bone Exposed: No Periwound Skin Texture Texture Color No Abnormalities Noted: No No Abnormalities Noted: No Excoriation: Yes Erythema: Yes Erythema Location: Circumferential Moisture No Abnormalities Noted: No Temperature / Pain Temperature: No Abnormality Tenderness on Palpation: Yes Wound Preparation Ulcer Cleansing: Rinsed/Irrigated with Saline Topical Anesthetic Applied: Other: lidocaine 4%, Treatment Notes Wound #1 (Right, Posterior Lower Leg) 1. Cleansed with: Clean wound with Normal Saline 2. Anesthetic Topical Lidocaine 4% cream to wound bed prior to debridement 4. Dressing Applied: Medihoney Gel 5. Secondary Dressing Applied Dry [place] Signature(s) Signed: 02/05/2017 4:53:33 PM By: Alric Quan Entered By: Alric Quan on 02/04/2017 [id number] [place], [name] -------------------------------------------------------------------------------- Vitals Details Patient Name: [place], [name] Date of Service: 02/04/2017 3:30 PM Medical Record Number: [id number] Patient Account Number: [id number] Date of Birth/Sex: 27-Jan-1935 (82 y.o. Female) Treating RN: Carolyne Fiscal, [name] Primary Care Javana Schey: Lynett Fish Other Clinician: Referring Nichole Keltner: Lynett Fish Treating Greyson Peavy/Extender: Frann Rider in Treatment: 3 Vital Signs Time Taken: 15:41 Temperature (F): 97.7 Height (in): 64 Pulse (bpm): 96 Weight (lbs):  142.7 Respiratory Rate (breaths/min): 18 Body Mass Index (BMI): 24.5 Blood Pressure (mmHg): 157/56 Reference Range: 80 - 120 mg / dl Electronic  Signature(s) Signed: 02/05/2017 4:53:33 PM By: Alric Quan Entered By: Alric Quan on 02/04/2017 15:43:48

## 2017-02-06 NOTE — Progress Notes (Signed)
KEILANY, CALIGIURI ([id number]) Visit Report for 02/04/2017 Chief Complaint Document Details Patient Name: [name], [name]. Date of Service: 02/04/2017 3:30 PM Medical Record Number: [id number] Patient Account Number: [id number] Date of Birth/Sex: 07/05/1935 (82 y.o. Female) Treating RN: Carolyne Fiscal, [name] Primary Care Provider: Lynett Fish Other Clinician: Referring Provider: Lynett Fish Treating Provider/Extender: Frann Rider in Treatment: 3 Information Obtained from: Patient Chief Complaint Patients presents for treatment of an open diabetic ulcer to the right lower extremity which is had for about a month Electronic Signature(s) Signed: 02/04/2017 4:26:38 PM By: Christin Fudge MD, FACS Entered By: Christin Fudge on 02/04/2017 [id number] Hobbs, Bevely Jenetta Downer ([id number]) -------------------------------------------------------------------------------- HPI Details Patient Name: [name], [name]. Date of Service: 02/04/2017 3:30 PM Medical Record Number: [id number] Patient Account Number: [id number] Date of Birth/Sex: 03-22-1935 (82 y.o. Female) Treating RN: Carolyne Fiscal, [name] Primary Care Provider: Lynett Fish Other Clinician: Referring Provider: Lynett Fish Treating Provider/Extender: Frann Rider in Treatment: 3 History of Present Illness Location: right lower posterior calf Quality: Patient reports experiencing a sharp pain to affected area(s). Severity: Patient states wound are getting worse. Duration: Patient has had the wound for < 4 weeks prior to presenting for treatment Timing: Pain in wound is Intermittent (comes and goes Context: The wound occurred when the patient hit the back of her leg against a blunt object at home which opened out a wound Modifying Factors: Other treatment(s) tried include:Keflex and doxycycline by mouth Associated Signs and Symptoms: Patient reports having increase swelling. HPI Description: 82 year old patient who is known to have  diabetes mellitus and tobacco abuse has had a injury to the right posterior calf about a month ago. He has been treated with local care and 2 courses of antibiotics which include Keflex and doxycycline which she has completed. Smokes about a pack and a half of cigarettes a day and has a past medical history of COPD, diabetes mellitus, hyperlipidemia,psoriasis and hypothyroidism. 01/21/2017 -- she is still working on giving up smoking but is not inclined to do so. 01/28/2017 -- the patient is rather noncompliant and has little desire to follow instructions. 02/04/2017 -- since my talk with her last week she has begun wearing her compression stockings. Electronic Signature(s) Signed: 02/04/2017 4:27:04 PM By: Christin Fudge MD, FACS Entered By: Christin Fudge on 02/04/2017 [id number] [place]rough, [name] -------------------------------------------------------------------------------- Physical Exam Details Patient Name: [name], [name]. Date of Service: 02/04/2017 3:30 PM Medical Record Number: [id number] Patient Account Number: [id number] Date of Birth/Sex: 16-Aug-1935 (82 y.o. Female) Treating RN: Carolyne Fiscal, [name] Primary Care Provider: Lynett Fish Other Clinician: Referring Provider: Lynett Fish Treating Provider/Extender: Frann Rider in Treatment: 3 Constitutional . Pulse regular. Respirations normal and unlabored. Afebrile. . Eyes Nonicteric. Reactive to light. Ears, Nose, Mouth, and Throat Lips, teeth, and gums WNL.Marland Kitchen Moist mucosa without lesions. Neck supple and nontender. No palpable supraclavicular or cervical adenopathy. Normal sized without goiter. Respiratory WNL. No retractions.. Breath sounds WNL, No rubs, rales, rhonchi, or wheeze.. Cardiovascular Heart rhythm and rate regular, no murmur or gallop.. Pedal Pulses WNL. No clubbing, cyanosis or edema. Chest Breasts symmetical and no nipple discharge.. Breast tissue WNL, no masses, lumps, or  tenderness.. Lymphatic No adneopathy. No adenopathy. No adenopathy. Musculoskeletal Adexa without tenderness or enlargement.. Digits and nails w/o clubbing, cyanosis, infection, petechiae, ischemia, or inflammatory conditions.. Integumentary (Hair, Skin) No suspicious lesions. No crepitus or fluctuance. No peri-wound warmth or erythema. No masses.Marland Kitchen Psychiatric Judgement and insight Intact.. 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 She has a lot of tenderness and is not able to tolerate any debridement Electronic Signature(s) Signed: 02/04/2017 4:28:07 PM By: Christin Fudge MD, FACS Entered By: Christin Fudge on 02/04/2017 [id number] Asche, [name] -------------------------------------------------------------------------------- Physician Orders Details Patient Name: [name] Date of Service: 02/04/2017 3:30 PM Medical Record Number: [id number] Patient Account Number: [id number] Date of Birth/Sex: 1935-08-26 (82 y.o. Female) Treating RN: Carolyne Fiscal, [name] Primary Care Provider: Lynett Fish Other Clinician: Referring Provider: Lynett Fish Treating Provider/Extender: Frann Rider in Treatment: 3 Verbal / Phone Orders: Yes Clinician: Pinkerton, [name] Read Back and Verified: Yes Diagnosis Coding Wound Cleansing Wound #1 Right,Posterior Lower Leg o Clean wound with Normal Saline. o Cleanse wound with mild soap and water o May Shower, gently pat wound dry prior to applying new dressing. Anesthetic Wound #1 Right,Posterior Lower Leg o Topical Lidocaine 4% cream applied to wound bed prior to debridement Skin Barriers/Peri-Wound Care Wound #1 Right,Posterior Lower Leg o Skin Prep Primary Wound Dressing Wound #1 Right,Posterior Lower Leg o Medihoney gel Secondary Dressing Wound #1 Right,Posterior Lower  Leg o Non-adherent pad - telfa island Dressing Change Frequency Wound #1 Right,Posterior Lower Leg o Change dressing every other day. Follow-up Appointments Wound #1 Right,Posterior Lower Leg o Return Appointment in 1 week. Edema Control Wound #1 Right,Posterior Lower Leg o Elevate legs to the level of the heart and pump ankles as often as possible o Support Garment 10-20 mm/Hg pressure to: - Patient to pick up 15-103mmhg stockings for her right leg. AIVREE, BOYD ([id number]) Additional Orders / Instructions Wound #1 Right,Posterior Lower Leg o Stop Smoking o Increase protein intake. Medications-please add to medication list. Wound #1 Right,Posterior Lower Leg o Other: - vitamin c, zinc, MVI Electronic Signature(s) Signed: 02/04/2017 4:29:50 PM By: Christin Fudge MD, FACS Signed: 02/05/2017 4:53:33 PM By: Alric Quan Entered By: Alric Quan on 02/04/2017 [id number] [place], Talayah Jenetta Downer ([id number]) -------------------------------------------------------------------------------- Problem List Details Patient Name: Pollman, Masen O. Date of Service: 02/04/2017 3:30 PM Medical Record Number: [id number] Patient Account Number: [id number] Date of Birth/Sex: 1935/01/20 (82 y.o. 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 Date of Service: 02/04/2017 3:30 PM Medical Record Number: [id number] Patient Account Number: [id number] Date of Birth/Sex: 1934-12-31 (82 y.o. Female) Treating RN: Carolyne Fiscal, [name] Primary Care Provider: Lynett Fish Other Clinician: Referring Provider: Lynett Fish Treating Provider/Extender: Frann Rider in Treatment: 3 Subjective Chief Complaint Information obtained from Patient Patients presents for treatment of an open diabetic ulcer to the right lower extremity which is had for about a month History of Present Illness (HPI) The following HPI elements were documented for the patient's wound: Location: right lower posterior calf Quality: Patient reports experiencing a sharp pain to affected area(s). Severity: Patient states wound are  getting worse. Duration: Patient has had the wound for < 4 weeks prior to presenting for treatment Timing: Pain in wound is Intermittent (comes and goes Context: The wound occurred when the patient hit the back of her leg against a blunt object at home which opened out a wound Modifying Factors: Other treatment(s) tried include:Keflex and doxycycline by mouth Associated Signs and Symptoms: Patient reports having increase swelling. 82 year old patient who is known to have diabetes mellitus and tobacco abuse has had a injury to the right posterior calf about a month ago. He has been treated with local care and 2 courses of antibiotics which include Keflex and doxycycline which she has completed. Smokes about a pack and a half of cigarettes a day and has a past medical history of COPD, diabetes mellitus, hyperlipidemia,psoriasis and hypothyroidism. 01/21/2017 -- she is still working on giving up smoking but is not inclined to do  so. 01/28/2017 -- the patient is rather noncompliant and has little desire to follow instructions. 02/04/2017 -- since my talk with her last week she has begun wearing her compression stockings. Objective Constitutional Pulse regular. Respirations normal and unlabored. Afebrile. Vitals Time Taken: 3:41 PM, Height: 64 in, Weight: 142.7 lbs, BMI: 24.5, Temperature: 97.7 F, Pulse: 96 bpm, Respiratory Rate: 18 breaths/min, Blood Pressure: 157/56 mmHg. YSABELA, IACOBELLI ([id number]) Eyes Nonicteric. Reactive to light. Ears, Nose, Mouth, and Throat Lips, teeth, and gums WNL.Marland Kitchen Moist mucosa without lesions. Neck supple and nontender. No palpable supraclavicular or cervical adenopathy. Normal sized without goiter. Respiratory WNL. No retractions.. Breath sounds WNL, No rubs, rales, rhonchi, or wheeze.. Cardiovascular Heart rhythm and rate regular, no murmur or gallop.. Pedal Pulses WNL. No clubbing, cyanosis or edema. Chest Breasts symmetical and no nipple discharge.. Breast tissue WNL, no masses, lumps, or tenderness.. Lymphatic No adneopathy. No adenopathy. No adenopathy. Musculoskeletal Adexa without tenderness or enlargement.. Digits and nails w/o clubbing, cyanosis, infection, petechiae, ischemia, or inflammatory conditions.Marland Kitchen Psychiatric Judgement and insight Intact.. No evidence of depression, anxiety, or agitation.. 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 She has a lot of tenderness and is not able to tolerate any debridement Integumentary (Hair, Skin) No suspicious lesions. No crepitus or fluctuance. No peri-wound warmth or erythema. No masses.. Wound #1 status is Open. Original cause of wound was Trauma. The wound is located on the Right,Posterior Lower Leg. The wound measures 0.8cm length x 1cm width x 0.1cm depth; 0.628cm^2 area and 0.063cm^3 volume. There is Fat Layer (Subcutaneous  Tissue) Exposed exposed. There is no tunneling or undermining noted. There is a large amount of serosanguineous drainage noted. The wound margin is distinct with the outline attached to the wound base. There is no granulation within the wound bed. There is a large (67-100%) amount of necrotic tissue within the wound bed including Adherent Slough. The periwound skin appearance exhibited: Excoriation, Erythema. The surrounding wound skin color is noted with erythema which is circumferential. Periwound temperature was noted as No Abnormality. The periwound has tenderness on palpation. Assessment SIEANNA, FATTA ([id number]) Active Problems ICD-10 E11.622 - Type 2 diabetes mellitus with other skin ulcer L97.212 - Non-pressure chronic ulcer of right calf with fat layer exposed I89.0 - Lymphedema, not elsewhere classified F17.218 - Nicotine dependence, cigarettes, with other nicotine-induced disorders Plan Wound Cleansing: Wound #1 Right,Posterior Lower Leg: Clean wound with Normal Saline. Cleanse wound with mild soap and water May Shower, gently pat wound dry prior to  applying new dressing. Anesthetic: Wound #1 Right,Posterior Lower Leg: Topical Lidocaine 4% cream applied to wound bed prior to debridement Skin Barriers/Peri-Wound Care: Wound #1 Right,Posterior Lower Leg: Skin Prep Primary Wound Dressing: Wound #1 Right,Posterior Lower Leg: Medihoney gel Secondary Dressing: Wound #1 Right,Posterior Lower Leg: Non-adherent pad - telfa island Dressing Change Frequency: Wound #1 Right,Posterior Lower Leg: Change dressing every other day. Follow-up Appointments: Wound #1 Right,Posterior Lower Leg: Return Appointment in 1 week. Edema Control: Wound #1 Right,Posterior Lower Leg: Elevate legs to the level of the heart and pump ankles as often as possible Support Garment 10-20 mm/Hg pressure to: - Patient to pick up 15-64mmhg stockings for her right leg. Additional Orders /  Instructions: Wound #1 Right,Posterior Lower Leg: Stop Smoking Increase protein intake. Medications-please add to medication list.: Wound #1 Right,Posterior Lower Leg: Other: - vitamin c, zinc, MVI Nogueira, Olayinka O. ([id number]) After review I have recommended: 1. Medihoney with a bordered foam to be changed every other day 2. Elevation and exercise 3. she is able to wear her compression stockings and his being a bit more compliant 4. she continues to smoke cigarettes and does not want to quit 5. Good control of her diabetes mellitus 6. Adequate protein, vitamin A, vitamin C and zinc She has had all questions answered and said she will be compliant Electronic Signature(s) Signed: 02/04/2017 4:29:11 PM By: Christin Fudge MD, FACS Entered By: Christin Fudge on 02/04/2017 [id number] Chizek, Aissa Jenetta Downer ([id number]) -------------------------------------------------------------------------------- SuperBill Details Patient Name: Villegas, [name]. Date of Service: 02/04/2017 Medical Record Number: [id number] Patient Account Number: [id number] Date of Birth/Sex: 07/09/1935 (82 y.o. Female) Treating RN: Carolyne Fiscal, [name] Primary Care Provider: Lynett Fish Other Clinician: Referring Provider: Lynett Fish Treating Provider/Extender: Christin Fudge Service Line: Outpatient Weeks in Treatment: 3 Diagnosis Coding ICD-10 Codes Code Description E11.622 Type 2 diabetes mellitus with other skin ulcer L97.212 Non-pressure chronic ulcer of right calf with fat layer exposed I89.0 Lymphedema, not elsewhere classified F17.218 Nicotine dependence, cigarettes, with other nicotine-induced disorders Facility Procedures CPT4 Code: [id number] Description: 99213 - WOUND CARE VISIT-LEV 3 EST PT Modifier: Quantity: 1 Physician Procedures CPT4 Code Description: [id number] - WC PHYS LEVEL 3 - EST PT ICD-10 Description Diagnosis E11.622 Type 2 diabetes mellitus with other skin ulcer I89.0 Lymphedema, not  elsewhere classified L97.212 Non-pressure chronic ulcer of right calf with fat l  F17.218 Nicotine dependence, cigarettes, with other nicotin Modifier: ayer exposed e-induced di Quantity: 1 sorders Electronic Signature(s) Signed: 02/05/2017 4:26:35 PM By: Christin Fudge MD, FACS Signed: 02/05/2017 4:53:33 PM By: Alric Quan Previous Signature: 02/04/2017 4:29:32 PM Version By: Christin Fudge MD, FACS Entered By: Alric Quan on 02/04/2017 17:31:08

## 2017-02-11 ENCOUNTER — Encounter: Payer: Medicare Other | Attending: Surgery | Admitting: Surgery

## 2017-02-11 DIAGNOSIS — F17218 Nicotine dependence, cigarettes, with other nicotine-induced disorders: Secondary | ICD-10-CM | POA: Diagnosis not present

## 2017-02-11 DIAGNOSIS — Z853 Personal history of malignant neoplasm of breast: Secondary | ICD-10-CM | POA: Diagnosis not present

## 2017-02-11 DIAGNOSIS — E11622 Type 2 diabetes mellitus with other skin ulcer: Secondary | ICD-10-CM | POA: Diagnosis not present

## 2017-02-11 DIAGNOSIS — Z923 Personal history of irradiation: Secondary | ICD-10-CM | POA: Diagnosis not present

## 2017-02-11 DIAGNOSIS — Z79899 Other long term (current) drug therapy: Secondary | ICD-10-CM | POA: Insufficient documentation

## 2017-02-11 DIAGNOSIS — I890 Lymphedema, not elsewhere classified: Secondary | ICD-10-CM | POA: Diagnosis not present

## 2017-02-11 DIAGNOSIS — Z7982 Long term (current) use of aspirin: Secondary | ICD-10-CM | POA: Diagnosis not present

## 2017-02-11 DIAGNOSIS — E039 Hypothyroidism, unspecified: Secondary | ICD-10-CM | POA: Insufficient documentation

## 2017-02-11 DIAGNOSIS — L97212 Non-pressure chronic ulcer of right calf with fat layer exposed: Secondary | ICD-10-CM | POA: Insufficient documentation

## 2017-02-11 DIAGNOSIS — D649 Anemia, unspecified: Secondary | ICD-10-CM | POA: Insufficient documentation

## 2017-02-11 DIAGNOSIS — E785 Hyperlipidemia, unspecified: Secondary | ICD-10-CM | POA: Insufficient documentation

## 2017-02-11 DIAGNOSIS — Z7984 Long term (current) use of oral hypoglycemic drugs: Secondary | ICD-10-CM | POA: Insufficient documentation

## 2017-02-11 DIAGNOSIS — J449 Chronic obstructive pulmonary disease, unspecified: Secondary | ICD-10-CM | POA: Diagnosis not present

## 2017-02-12 NOTE — Progress Notes (Signed)
Deborah, [name] ([id number]) Visit Report for 02/11/2017 Arrival Information Details Patient Name: [name], [name]. Date of Service: 02/11/2017 1:30 PM Medical Record Number: [id number] Patient Account Number: [id number] Date of Birth/Sex: 02/02/1935 (82 y.o. Female) Treating RN: Montey Hora Primary Care [name]: Lynett Fish Other Clinician: Referring [name]: Lynett Fish Treating [name]/Extender: Frann Rider in Treatment: 4 Visit Information History Since Last Visit Added or deleted any medications: No Patient Arrived: Ambulatory Any new allergies or adverse reactions: No Arrival Time: 13:40 Had a fall or experienced change in No Accompanied By: self activities of daily living that may affect Transfer Assistance: None risk of falls: Patient Identification Verified: Yes Signs or symptoms of abuse/neglect since last No Secondary Verification Process Yes visito Completed: Hospitalized since last visit: No Patient Requires Transmission-Based No Has Dressing in Place as Prescribed: Yes Precautions: Pain Present Now: No Patient Has Alerts: Yes Patient Alerts: DM II Electronic Signature(s) Signed: 02/11/2017 4:53:39 PM By: Montey Hora Entered By: Montey Hora on 02/11/2017 [id number] [name] -------------------------------------------------------------------------------- Encounter Discharge Information Details Patient Name: [name]. Date of Service: 02/11/2017 1:30 PM Medical Record Number: [id number] Patient Account Number: [id number] Date of Birth/Sex: [birth year] (82 y.o. Female) Treating RN: Montey Hora Primary Care [name]: Lynett Fish Other Clinician: Referring Finnean Cerami: Lynett Fish Treating [name]/Extender: Frann Rider in Treatment: 4 Encounter Discharge Information Items Discharge Pain Level: 0 Discharge Condition: Stable Ambulatory Status: Ambulatory Discharge Destination:  Home Transportation: Private Auto Accompanied By: self Schedule Follow-up Appointment: Yes Medication Reconciliation completed and provided to Patient/Care No [name]: Provided on Clinical Summary of Care: 02/11/2017 Form Type Recipient Paper Patient AM Electronic Signature(s) Signed: 02/11/2017 2:25:07 PM By: Montey Hora Previous Signature: 02/11/2017 2:12:51 PM Version By: Ruthine Dose Entered By: Montey Hora on 02/11/2017 [id number] [name], [name] -------------------------------------------------------------------------------- Lower Extremity Assessment Details Patient Name: Wise, [name] Date of Service: 02/11/2017 1:30 PM Medical Record Number: [id number] Patient Account Number: [id number] Date of Birth/Sex: [birth year] (82 y.o. Female) Treating RN: Montey Hora Primary Care Zamarian Scarano: Lynett Fish Other Clinician: Referring [name]: Lynett Fish Treating Keondra Haydu/Extender: Frann Rider in Treatment: 4 Vascular Assessment Pulses: Dorsalis Pedis Palpable: [Right:Yes] Posterior Tibial Extremity colors, hair growth, and conditions: Extremity Color: [Right:Normal] Hair Growth on Extremity: [Right:No] Temperature of Extremity: [Right:Warm] Capillary Refill: [Right:< 3 seconds] Electronic Signature(s) Signed: 02/11/2017 4:53:39 PM By: Montey Hora Entered By: Montey Hora on 02/11/2017 0000000 [name] -------------------------------------------------------------------------------- Multi Wound Chart Details Patient Name: [name]. Date of Service: 02/11/2017 1:30 PM Medical Record Number: [id number] Patient Account Number: [id number] Date of Birth/Sex: Jan 29, 1935 (82 y.o. Female) Treating RN: Montey Hora Primary Care [name]: Lynett Fish Other Clinician: Referring [name]: Lynett Fish Treating [name]/Extender: Frann Rider in Treatment: 4 Vital Signs Height(in): 64 Pulse(bpm):  92 Weight(lbs): 142.7 Blood Pressure 156/59 (mmHg): Body Mass Index(BMI): 24 Temperature(F): 97.8 Respiratory Rate 18 (breaths/min): Photos: [1:No Photos] [N/A:N/A] Wound Location: [1:Right Lower Leg - Posterior] [N/A:N/A] Wounding Event: [1:Trauma] [N/A:N/A] Primary Etiology: [1:Diabetic Wound/Ulcer of N/A the Lower Extremity] Secondary Etiology: [1:Trauma, Other] [N/A:N/A] Comorbid History: [1:Cataracts, Anemia, Chronic Obstructive Pulmonary Disease (COPD), Type II Diabetes, Received Radiation] [N/A:N/A] Date Acquired: [1:12/17/2016] [N/A:N/A] Weeks of Treatment: [1:4] [N/A:N/A] Wound Status: [1:Open] [N/A:N/A] Measurements L x W x D 1x1x0.1 [N/A:N/A] (cm) Area (cm) : [1:0.785] [N/A:N/A] Volume (cm) : [1:0.079] [N/A:N/A] % Reduction in Area: [1:68.80%] [N/A:N/A] % Reduction in Volume: 68.50% [N/A:N/A] Classification: [1:Grade 2] [N/A:N/A] Exudate Amount: [1:Large] [N/A:N/A] Exudate Type: [1:Serosanguineous] [  N/A:N/A] Exudate Color: [1:red, brown] [N/A:N/A] Wound Margin: [1:Distinct, outline attached N/A] Granulation Amount: [1:Medium (34-66%)] [N/A:N/A] Granulation Quality: [1:Pink] [N/A:N/A] Necrotic Amount: [1:Medium (34-66%)] [N/A:N/A] Exposed Structures: [N/A:N/A] Fat Layer (Subcutaneous Tissue) Exposed: Yes Fascia: No Tendon: No Muscle: No Joint: No Bone: No Epithelialization: Small (1-33%) N/A N/A Debridement: Debridement [id number] N/A N/A [id number]) Pre-procedure 13:58 N/A N/A Verification/Time Out Taken: Pain Control: Lidocaine 4% Topical N/A N/A Solution Tissue Debrided: Fibrin/Slough, Exudates, N/A N/A Subcutaneous Level: Skin/Subcutaneous N/A N/A Tissue Debridement Area (sq 1 N/A N/A cm): Instrument: Curette N/A N/A Bleeding: Minimum N/A N/A Hemostasis Achieved: Pressure N/A N/A Procedural Pain: 0 N/A N/A Post Procedural Pain: 0 N/A N/A Debridement Treatment Procedure was tolerated N/A N/A Response: well Post Debridement 1x1x0.1 N/A  N/A Measurements L x W x D (cm) Post Debridement 0.079 N/A N/A Volume: (cm) Periwound Skin Texture: Excoriation: Yes N/A N/A Periwound Skin No Abnormalities Noted N/A N/A Moisture: Periwound Skin Color: Erythema: Yes N/A N/A Erythema Location: Circumferential N/A N/A Temperature: No Abnormality N/A N/A Tenderness on Yes N/A N/A Palpation: Wound Preparation: Ulcer Cleansing: N/A N/A Rinsed/Irrigated with Saline Topical Anesthetic Applied: Other: lidocaine 4% Procedures Performed: Debridement N/A N/A ZAIYA, OHORA ([id number]) Treatment Notes Wound #1 (Right, Posterior Lower Leg) 1. Cleansed with: Clean wound with Normal Saline 2. Anesthetic Topical Lidocaine 4% cream to wound bed prior to debridement 4. Dressing Applied: Hydrogel Other dressing (specify in notes) 5. Secondary [place] Signature(s) Signed: 02/11/2017 2:25:29 PM By: Christin Fudge MD, FACS Entered By: Christin Fudge on 02/11/2017 [id number] Delucchi, [name] -------------------------------------------------------------------------------- [place] Details Patient Name: [name], [name]. Date of Service: 02/11/2017 1:30 PM Medical Record Number: [id number] Patient Account Number: [id number] Date of Birth/Sex: Mar 05, 1935 (82 y.o. Female) Treating RN: Montey Hora Primary Care [place]: Lynett Fish Other Clinician: Referring Fradel Baldonado: Lynett Fish Treating Josslynn Mentzer/Extender: Frann Rider in Treatment: 4 Active Inactive ` Nutrition Nursing Diagnoses: Imbalanced nutrition Impaired glucose control: actual or potential Potential for alteratiion in Nutrition/Potential for imbalanced nutrition Goals: Patient/caregiver agrees to and verbalizes understanding of need to use nutritional supplements and/or vitamins as prescribed Date Initiated: 01/14/2017 Target Resolution Date: 03/20/2017 Goal Status: Active Interventions: Assess  patient nutrition upon admission and as needed per policy Provide education on elevated blood sugars and impact on wound healing Notes: ` Orientation to the Wound Care Program Nursing Diagnoses: Knowledge deficit related to the wound healing center program Goals: Patient/caregiver will verbalize understanding of the [place] [facility] Date Initiated: 01/14/2017 Target Resolution Date: 02/20/2017 Goal Status: Active Interventions: Provide education on orientation to the wound center Notes: ` Pain, Acute or Chronic Dud[place], [name] Nursing Diagnoses: Pain, acute or chronic: actual or potential Potential alteration in comfort, pain Goals: Patient will verbalize adequate pain control and receive pain control interventions during procedures as needed Date Initiated: 01/14/2017 Target Resolution Date: 03/20/2017 Goal Status: Active Interventions: Assess comfort goal upon admission Complete pain assessment as per visit requirements Notes: ` Wound/Skin Impairment Nursing Diagnoses: Impaired tissue integrity Knowledge deficit related to smoking impact on wound healing Knowledge deficit related to ulceration/compromised skin integrity Goals: Ulcer/skin breakdown will have a volume reduction of 80% by week 12 Date Initiated: 01/14/2017 Target Resolution Date: 03/20/2017 Goal Status: Active Interventions: Assess patient/caregiver ability to perform ulcer/skin care regimen upon admission and as needed Assess ulceration(s) every visit Provide education on smoking Notes: Electronic Signature(s) Signed: 02/11/2017 4:53:39 PM By: Montey Hora Entered By: Montey Hora on 02/11/2017 [id number] Lafoy, [name] (  [id number]) -------------------------------------------------------------------------------- Pain Assessment Details Patient Name: [name], [name]. Date of Service: 02/11/2017 1:30 PM Medical Record Number: [id number] Patient Account Number: [id number] Date of  Birth/Sex: 1935/05/18 (82 y.o. Female) Treating RN: Montey Hora Primary Care Imunique Samad: Lynett Fish Other Clinician: Referring Shakenya Stoneberg: Lynett Fish Treating Lesa Vandall/Extender: Frann Rider in Treatment: 4 Active Problems Location of Pain Severity and Description of Pain Patient Has Paino No Site Locations Pain Management and Medication Current Pain Management: Notes Topical or injectable lidocaine is offered to patient for acute pain when surgical debridement is performed. If needed, Patient is instructed to use over the counter pain medication for the following 24-48 hours after debridement. Wound care MDs do not prescribed pain medications. Patient has chronic pain or uncontrolled pain. Patient has been instructed to make an appointment with their Primary Care Physician for pain management. Electronic Signature(s) Signed: 02/11/2017 4:53:39 PM By: Montey Hora Entered By: Montey Hora on 02/11/2017 [id number] Rafael Capo, [name] -------------------------------------------------------------------------------- Patient/Caregiver Education Details Patient Name: [name] Date of Service: 02/11/2017 1:30 PM Medical Record Number: [id number] Patient Account Number: [id number] Date of Birth/Gender: 1935-04-25 (82 y.o. Female) Treating RN: Montey Hora Primary Care Physician: Lynett Fish Other Clinician: Referring Physician: Lynett Fish Treating Physician/Extender: Frann Rider in Treatment: 4 Education Assessment Education Provided To: Patient Education Topics Provided Wound/Skin Impairment: Handouts: Other: wound care as ordered Methods: Demonstration, Explain/Verbal Responses: State content correctly Electronic Signature(s) Signed: 02/11/2017 4:53:39 PM By: Montey Hora Entered By: Montey Hora on 02/11/2017 [id number] Villela, [name]  ([id number]) -------------------------------------------------------------------------------- Wound Assessment Details Patient Name: [name], [name]. Date of Service: 02/11/2017 1:30 PM Medical Record Number: [id number] Patient Account Number: [id number] Date of Birth/Sex: 12/30/1934 (82 y.o. Female) Treating RN: Montey Hora Primary Care Keary Waterson: Lynett Fish Other Clinician: Referring Pietra Zuluaga: Lynett Fish Treating Atleigh Gruen/Extender: Frann Rider in Treatment: 4 Wound Status Wound Number: 1 Primary Diabetic Wound/Ulcer of the Lower Etiology: Extremity Wound Location: Right Lower Leg - Posterior Secondary Trauma, Other Wounding Event: Trauma Etiology: Date Acquired: 12/17/2016 Wound Open Weeks Of Treatment: 4 Status: Clustered Wound: No Comorbid Cataracts, Anemia, Chronic History: Obstructive Pulmonary Disease (COPD), Type II Diabetes, Received Radiation Photos Photo Uploaded By: Montey Hora on 02/11/2017 15:59:56 Wound Measurements Length: (cm) 1 Width: (cm) 1 Depth: (cm) 0.1 Area: (cm) 0.785 Volume: (cm) 0.079 % Reduction in Area: 68.8% % Reduction in Volume: 68.5% Epithelialization: Small (1-33%) Tunneling: No Undermining: No Wound Description Classification: Grade 2 Wound Margin: Distinct, outline attached Exudate Amount: Large Exudate Type: Serosanguineous Exudate Color: red, brown Foul Odor After Cleansing: No Slough/Fibrino No Wound Bed Granulation Amount: Medium (34-66%) Exposed Structure [place], [name] Kitchen ([id number]) Granulation Quality: Pink Fascia Exposed: No Necrotic Amount: Medium (34-66%) Fat Layer (Subcutaneous Tissue) Exposed: Yes Necrotic Quality: Adherent Slough Tendon Exposed: No Muscle Exposed: No Joint Exposed: No Bone Exposed: No Periwound Skin Texture Texture Color No Abnormalities Noted: No No Abnormalities Noted: No Excoriation: Yes Erythema: Yes Erythema Location: Circumferential Moisture No  Abnormalities Noted: No Temperature / Pain Temperature: No Abnormality Tenderness on Palpation: Yes Wound Preparation Ulcer Cleansing: Rinsed/Irrigated with Saline Topical Anesthetic Applied: Other: lidocaine 4%, Treatment Notes Wound #1 (Right, Posterior Lower Leg) 1. Cleansed with: Clean wound with Normal Saline 2. Anesthetic Topical Lidocaine 4% cream to wound bed prior to debridement 4. Dressing Applied: Hydrogel Other dressing (specify in notes) 5. Secondary [place] Signature(s) Signed: 02/11/2017 4:53:39 PM By: Montey Hora Entered By: Montey Hora on 02/11/2017 [id number] Cybulski, Ramiah  Jenetta [name] ([id number]) -------------------------------------------------------------------------------- Vitals Details Patient Name: [name], [name]. Date of Service: 02/11/2017 1:30 PM Medical Record Number: [id number] Patient Account Number: [id number] Date of Birth/Sex: [birth year] (82 y.o. Female) Treating RN: Montey Hora Primary Care Demetre Monaco: Lynett Fish Other Clinician: Referring Sophie Tamez: Lynett Fish Treating Adabella Stanis/Extender: Frann Rider in Treatment: 4 Vital Signs Time Taken: 13:43 Temperature (F): 97.8 Height (in): 64 Pulse (bpm): 92 Weight (lbs): 142.7 Respiratory Rate (breaths/min): 18 Body Mass Index (BMI): 24.5 Blood Pressure (mmHg): 156/59 Reference Range: 80 - 120 mg / dl Electronic Signature(s) Signed: 02/11/2017 4:53:39 PM By: Montey Hora Entered By: Montey Hora on 02/11/2017 13:43:27

## 2017-02-12 NOTE — Progress Notes (Signed)
LARINDA, MOHN ([id number]) Visit Report for 02/11/2017 Chief Complaint Document Details Patient Name: [name], [name]. Date of Service: 02/11/2017 1:30 PM Medical Record Number: [id number] Patient Account Number: [id number] Date of Birth/Sex: January 18, 1935 (81 y.o. Female) Treating RN: Montey Hora Primary Care Provider: Lynett Fish Other Clinician: Referring Provider: Lynett Fish Treating Provider/Extender: Frann Rider in Treatment: 4 Information Obtained from: Patient Chief Complaint Patients presents for treatment of an open diabetic ulcer to the right lower extremity which is had for about a month Electronic Signature(s) Signed: 02/11/2017 2:25:49 PM By: Christin Fudge MD, FACS Entered By: Christin Fudge on 02/11/2017 [id number] [name] -------------------------------------------------------------------------------- Debridement Details Patient Name: [name]. Date of Service: 02/11/2017 1:30 PM Medical Record Number: [id number] Patient Account Number: [id number] Date of Birth/Sex: 04-29-1935 (81 y.o. Female) Treating RN: Montey Hora Primary Care Provider: Lynett Fish Other Clinician: Referring Provider: Lynett Fish Treating Provider/Extender: Frann Rider in Treatment: 4 Debridement Performed for Wound #1 Right,Posterior Lower Leg Assessment: Performed By: Physician Christin Fudge, MD Debridement: Debridement Pre-procedure Yes - 13:58 Verification/Time Out Taken: Start Time: 13:58 Pain Control: Lidocaine 4% Topical Solution Level: Skin/Subcutaneous Tissue Total Area Debrided (L x 1 (cm) x 1 (cm) = 1 (cm) W): Tissue and other Viable, Non-Viable, Exudate, Fibrin/Slough, Subcutaneous material debrided: Instrument: Curette Bleeding: Minimum Hemostasis Achieved: Pressure End Time: 14:01 Procedural Pain: 0 Post Procedural Pain: 0 Response to Treatment: Procedure was tolerated well Post Debridement Measurements of  Total Wound Length: (cm) 1 Width: (cm) 1 Depth: (cm) 0.1 Volume: (cm) 0.079 Character of Wound/Ulcer Post Improved Debridement: Severity of Tissue Post Debridement: Fat layer exposed Post Procedure Diagnosis Same as Pre-procedure Electronic Signature(s) Signed: 02/11/2017 2:25:40 PM By: Christin Fudge MD, FACS Signed: 02/11/2017 4:53:39 PM By: Montey Hora Entered By: Christin Fudge on 02/11/2017 0000000 [name] [name] -------------------------------------------------------------------------------- HPI Details Patient Name: [name], [name] Date of Service: 02/11/2017 1:30 PM Medical Record Number: [id number] Patient Account Number: [id number] Date of Birth/Sex: [birth year] (81 y.o. Female) Treating RN: Montey Hora Primary Care Provider: Lynett Fish Other Clinician: Referring Provider: Lynett Fish Treating Provider/Extender: Frann Rider in Treatment: 4 History of Present Illness Location: right lower posterior calf Quality: Patient reports experiencing a sharp pain to affected area(s). Severity: Patient states wound are getting worse. Duration: Patient has had the wound for < 4 weeks prior to presenting for treatment Timing: Pain in wound is Intermittent (comes and goes Context: The wound occurred when the patient hit the back of her leg against a blunt object at home which opened out a wound Modifying Factors: Other treatment(s) tried include:Keflex and doxycycline by mouth Associated Signs and Symptoms: Patient reports having increase swelling. HPI Description: 82 year old patient who is known to have diabetes mellitus and tobacco abuse has had a injury to the right posterior calf about a month ago. He has been treated with local care and 2 courses of antibiotics which include Keflex and doxycycline which she has completed. Smokes about a pack and a half of cigarettes a day and has a past medical history of COPD,  diabetes mellitus, hyperlipidemia,psoriasis and hypothyroidism. 01/21/2017 -- she is still working on giving up smoking but is not inclined to do so. 01/28/2017 -- the patient is rather noncompliant and has little desire to follow instructions. 02/04/2017 -- since my talk with her last week she has begun wearing her compression stockings. Electronic Signature(s) Signed: 02/11/2017 2:25:53 PM By: Christin Fudge MD, FACS  Entered By: Christin Fudge on 02/11/2017 [id number] [name] -------------------------------------------------------------------------------- Physical Exam Details Patient Name: [name], [name] Date of Service: 02/11/2017 1:30 PM Medical Record Number: [id number] Patient Account Number: [id number] Date of Birth/Sex: 1935-10-08 (81 y.o. Female) Treating RN: Montey Hora Primary Care Provider: Lynett Fish Other Clinician: Referring Provider: Lynett Fish Treating Provider/Extender: Frann Rider in Treatment: 4 Constitutional . Pulse regular. Respirations normal and unlabored. Afebrile. . Eyes Nonicteric. Reactive to light. Ears, Nose, Mouth, and Throat Lips, teeth, and gums WNL.Marland Kitchen Moist mucosa without lesions. Neck supple and nontender. No palpable supraclavicular or cervical adenopathy. Normal sized without goiter. Respiratory WNL. No retractions.. Breath sounds WNL, No rubs, rales, rhonchi, or wheeze.. Cardiovascular Heart rhythm and rate regular, no murmur or gallop.. Pedal Pulses WNL. No clubbing, cyanosis or edema. Chest Breasts symmetical and no nipple discharge.. Breast tissue WNL, no masses, lumps, or tenderness.. Lymphatic No adneopathy. No adenopathy. No adenopathy. Musculoskeletal Adexa without tenderness or enlargement.. Digits and nails w/o clubbing, cyanosis, infection, petechiae, ischemia, or inflammatory conditions.. Integumentary (Hair, Skin) No suspicious lesions. No crepitus or fluctuance. No peri-wound warmth or  erythema. No masses.Marland Kitchen Psychiatric Judgement and insight Intact.. No evidence of depression, anxiety, or agitation.. Notes the lymphedema is better but she has significant eschar over wound and I have sharply debrided this down to the subcutaneous tissue and had to stop for significant tenderness Electronic Signature(s) Signed: 02/11/2017 2:26:24 PM By: Christin Fudge MD, FACS Entered By: Christin Fudge on 02/11/2017 [id number] [name], [name] -------------------------------------------------------------------------------- Physician Orders Details Patient Name: [name], [name]. Date of Service: 02/11/2017 1:30 PM Medical Record Number: [id number] Patient Account Number: [id number] Date of Birth/Sex: 11-Feb-1935 (81 y.o. Female) Treating RN: Montey Hora Primary Care Provider: Lynett Fish Other Clinician: Referring Provider: Lynett Fish Treating Provider/Extender: Frann Rider in Treatment: 4 Verbal / Phone Orders: No Diagnosis Coding Wound Cleansing Wound #1 Right,Posterior Lower Leg o Clean wound with Normal Saline. o Cleanse wound with mild soap and water o May Shower, gently pat wound dry prior to applying new dressing. Anesthetic Wound #1 Right,Posterior Lower Leg o Topical Lidocaine 4% cream applied to wound bed prior to debridement Skin Barriers/Peri-Wound Care Wound #1 Right,Posterior Lower Leg o Skin Prep Primary Wound Dressing Wound #1 Right,Posterior Lower Leg o Hydrogel o Cutimed Sorbact Secondary Dressing Wound #1 Right,Posterior Lower Leg o Non-adherent pad - telfa island Dressing Change Frequency Wound #1 Right,Posterior Lower Leg o Change dressing every other day. Follow-up Appointments Wound #1 Right,Posterior Lower Leg o Return Appointment in 1 week. Edema Control Wound #1 Right,Posterior Lower Leg o Elevate legs to the level of the heart and pump ankles as often as possible Carre, Cheral O. ([id number]) o  Support Garment 10-20 mm/Hg pressure to: - Patient to pick up 15-37mmhg stockings for her right leg. Additional Orders / Instructions Wound #1 Right,Posterior Lower Leg o Stop Smoking o Increase protein intake. Medications-please add to medication list. Wound #1 Right,Posterior Lower Leg o Other: - vitamin c, zinc, MVI Electronic Signature(s) Signed: 02/11/2017 4:14:08 PM By: Christin Fudge MD, FACS Signed: 02/11/2017 4:53:39 PM By: Montey Hora Entered By: Montey Hora on 02/11/2017 [id number] [place], Jeff Jenetta [name] ([id number]) -------------------------------------------------------------------------------- Problem List Details Patient Name: [name], [name] Date of Service: 02/11/2017 1:30 PM Medical Record Number: [id number] Patient Account Number: [id number] Date of Birth/Sex: 1935/12/02 (81 y.o. 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 Date of Service: 02/11/2017 1:30 PM Medical Record Number: [id number] Patient Account Number: [id number] Date of Birth/Sex: May 06, 1935 (81 y.o. Female) Treating RN: Montey Hora Primary Care Provider:  Lynett Fish Other Clinician: Referring Provider: Lynett Fish Treating Provider/Extender: Frann Rider in Treatment: 4 Subjective Chief Complaint Information obtained from Patient Patients presents for treatment of an open diabetic ulcer to the right lower extremity which is had for about a month History of Present Illness (HPI) The following HPI elements were documented for the patient's wound: Location: right lower posterior calf Quality: Patient reports experiencing a sharp pain to affected area(s). Severity: Patient states wound are getting worse. Duration: Patient has had the wound for < 4 weeks prior to presenting for treatment Timing: Pain in wound is Intermittent (comes and goes Context: The wound occurred when the patient hit the back of her leg against a blunt object at home which opened out a wound Modifying Factors: Other treatment(s) tried include:Keflex and doxycycline by mouth Associated Signs and Symptoms: Patient reports having increase swelling. 82 year old patient who is known to have diabetes mellitus and tobacco abuse has had a injury to the right posterior calf about a month ago. He has been treated with local care and 2 courses of antibiotics which include Keflex and doxycycline which she has completed. Smokes about a pack and a half of cigarettes a day and has a past medical history of COPD, diabetes mellitus, hyperlipidemia,psoriasis and hypothyroidism. 01/21/2017 -- she is still working on giving up smoking but is not inclined to do so. 01/28/2017 -- the patient is rather noncompliant and has little desire to follow instructions. 02/04/2017 -- since my talk with her last week she has begun wearing her compression stockings. Objective Constitutional Pulse regular. Respirations normal and unlabored. Afebrile. Vitals Time Taken: 1:43 PM, Height: 64 in, Weight: 142.7 lbs, BMI: 24.5, Temperature: 97.8 F, Pulse: 92 bpm, Respiratory Rate: 18  breaths/min, Blood Pressure: 156/59 mmHg. [name], [name] Eyes Nonicteric. Reactive to light. Ears, Nose, Mouth, and Throat Lips, teeth, and gums WNL.Marland Kitchen Moist mucosa without lesions. Neck supple and nontender. No palpable supraclavicular or cervical adenopathy. Normal sized without goiter. Respiratory WNL. No retractions.. Breath sounds WNL, No rubs, rales, rhonchi, or wheeze.. Cardiovascular Heart rhythm and rate regular, no murmur or gallop.. Pedal Pulses WNL. No clubbing, cyanosis or edema. Chest Breasts symmetical and no nipple discharge.. Breast tissue WNL, no masses, lumps, or tenderness.. Lymphatic No adneopathy. No adenopathy. No adenopathy. Musculoskeletal Adexa without tenderness or enlargement.. Digits and nails w/o clubbing, cyanosis, infection, petechiae, ischemia, or inflammatory conditions.Marland Kitchen Psychiatric Judgement and insight Intact.. No evidence of depression, anxiety, or agitation.. General Notes: the lymphedema is better but she has significant eschar over wound and I have sharply debrided this down to the subcutaneous tissue and had to stop for significant tenderness Integumentary (Hair, Skin) No suspicious lesions. No crepitus or fluctuance. No peri-wound warmth or erythema. No masses.. Wound #1 status is Open. Original cause of wound was Trauma.  The wound is located on the Right,Posterior Lower Leg. The wound measures 1cm length x 1cm width x 0.1cm depth; 0.785cm^2 area and 0.079cm^3 volume. There is Fat Layer (Subcutaneous Tissue) Exposed exposed. There is no tunneling or undermining noted. There is a large amount of serosanguineous drainage noted. The wound margin is distinct with the outline attached to the wound base. There is medium (34-66%) pink granulation within the wound bed. There is a medium (34-66%) amount of necrotic tissue within the wound bed including Adherent Slough. The periwound skin appearance exhibited: Excoriation, Erythema. The  surrounding wound skin color is noted with erythema which is circumferential. Periwound temperature was noted as No Abnormality. The periwound has tenderness on palpation. Assessment [name], [name] Active Problems ICD-10 E11.622 - Type 2 diabetes mellitus with other skin ulcer L97.212 - Non-pressure chronic ulcer of right calf with fat layer exposed I89.0 - Lymphedema, not elsewhere classified F17.218 - Nicotine dependence, cigarettes, with other nicotine-induced disorders Procedures Wound #1 Wound #1 is a Diabetic Wound/Ulcer of the Lower Extremity located on the Right,Posterior Lower Leg . There was a Skin/Subcutaneous Tissue Debridement [id number]) debridement with total area of 1 sq cm performed by Christin Fudge, MD. with the following instrument(s): Curette to remove Viable and Non-Viable tissue/material including Exudate, Fibrin/Slough, and Subcutaneous after achieving pain control using Lidocaine 4% Topical Solution. A time out was conducted at 13:58, prior to the start of the procedure. A Minimum amount of bleeding was controlled with Pressure. The procedure was tolerated well with a pain level of 0 throughout and a pain level of 0 following the procedure. Post Debridement Measurements: 1cm length x 1cm width x 0.1cm depth; 0.079cm^3 volume. Character of Wound/Ulcer Post Debridement is improved. Severity of Tissue Post Debridement is: Fat layer exposed. Post procedure Diagnosis Wound #1: Same as Pre-Procedure Plan Wound Cleansing: Wound #1 Right,Posterior Lower Leg: Clean wound with Normal Saline. Cleanse wound with mild soap and water May Shower, gently pat wound dry prior to applying new dressing. Anesthetic: Wound #1 Right,Posterior Lower Leg: Topical Lidocaine 4% cream applied to wound bed prior to debridement Skin Barriers/Peri-Wound Care: Wound #1 Right,Posterior Lower Leg: Skin Prep Primary Wound Dressing: Wound #1 Right,Posterior Lower  Leg: Hydrogel [name], [name] ([id number]) Cutimed Sorbact Secondary Dressing: Wound #1 Right,Posterior Lower Leg: Non-adherent pad - telfa island Dressing Change Frequency: Wound #1 Right,Posterior Lower Leg: Change dressing every other day. Follow-up Appointments: Wound #1 Right,Posterior Lower Leg: Return Appointment in 1 week. Edema Control: Wound #1 Right,Posterior Lower Leg: Elevate legs to the level of the heart and pump ankles as often as possible Support Garment 10-20 mm/Hg pressure to: - Patient to pick up 15-109mmhg stockings for her right leg. Additional Orders / Instructions: Wound #1 Right,Posterior Lower Leg: Stop Smoking Increase protein intake. Medications-please add to medication list.: Wound #1 Right,Posterior Lower Leg: Other: - vitamin c, zinc, MVI After review I have recommended: 1. Hydrogel with Sorbact, with a bordered foam to be changed every other day 2. Elevation and exercise 3. she is able to wear her compression stockings and his being a bit more compliant 4. she continues to smoke cigarettes and does not want to quit 5. Good control of her diabetes mellitus 6. Adequate protein, vitamin A, vitamin C and zinc She has had all questions answered and said she will be compliant Electronic Signature(s) Signed: 02/11/2017 2:28:09 PM By: Christin Fudge MD, FACS Entered By: Christin Fudge on 02/11/2017 [id number] Haislip, [name] -------------------------------------------------------------------------------- SuperBill Details Patient Name: Dierdre Highman  Jenetta [name] Date of Service: 02/11/2017 Medical Record Number: [id number] Patient Account Number: [id number] Date of Birth/Sex: Jan 16, 1935 (82 y.o. Female) Treating RN: Montey Hora Primary Care Provider: Lynett Fish Other Clinician: Referring Provider: Lynett Fish Treating Provider/Extender: Christin Fudge Service Line: Outpatient Weeks in Treatment: 4 Diagnosis Coding ICD-10 Codes Code  Description E11.622 Type 2 diabetes mellitus with other skin ulcer L97.212 Non-pressure chronic ulcer of right calf with fat layer exposed I89.0 Lymphedema, not elsewhere classified F17.218 Nicotine dependence, cigarettes, with other nicotine-induced disorders Facility Procedures CPT4 Code Description: [id number] - DEB SUBQ TISSUE 20 SQ CM/< ICD-10 Description Diagnosis E11.622 Type 2 diabetes mellitus with other skin ulcer L97.212 Non-pressure chronic ulcer of right calf with fat l I89.0 Lymphedema, not elsewhere classified  F17.218 Nicotine dependence, cigarettes, with other nicotin Modifier: ayer exposed e-induced di Quantity: 1 sorders Physician Procedures CPT4 Code Description: [id number] - WC PHYS SUBQ TISS 20 SQ CM ICD-10 Description Diagnosis E11.622 Type 2 diabetes mellitus with other skin ulcer L97.212 Non-pressure chronic ulcer of right calf with fat l I89.0 Lymphedema, not elsewhere classified  F17.218 Nicotine dependence, cigarettes, with other nicotin Modifier: ayer exposed e-induced di Quantity: 1 sorders Electronic Signature(s) Signed: 02/11/2017 2:28:21 PM By: Christin Fudge MD, FACS Entered By: Christin Fudge on 02/11/2017 14:28:21

## 2017-02-18 ENCOUNTER — Encounter: Payer: Medicare Other | Admitting: Surgery

## 2017-02-18 DIAGNOSIS — E11622 Type 2 diabetes mellitus with other skin ulcer: Secondary | ICD-10-CM | POA: Diagnosis not present

## 2017-02-19 NOTE — Progress Notes (Addendum)
[name] ([id number]) Visit Report for 02/18/2017 Chief Complaint Document Details Patient Name: [name], [name]. Date of Service: 02/18/2017 1:30 PM Medical Record Number: [id number] Patient Account Number: [id number] Date of Birth/Sex: [birth year] (82 y.o. Female) Treating RN: Carolyne Fiscal, [name] Primary Care Provider: Lynett Fish Other Clinician: Referring Provider: Lynett Fish Treating Provider/Extender: Frann Rider in Treatment: 5 Information Obtained from: Patient Chief Complaint Patients presents for treatment of an open diabetic ulcer to the right lower extremity which is had for about a month Electronic Signature(s) Signed: 02/18/2017 1:46:03 PM By: Christin Fudge MD, FACS Entered By: Christin Fudge on 02/18/2017 62:22:97 [name] ([id number]) -------------------------------------------------------------------------------- Debridement Details Patient Name: [name]. Date of Service: 02/18/2017 1:30 PM Medical Record Number: [id number] Patient Account Number: [id number] Date of Birth/Sex: 02-16-1935 (82 y.o. Female) Treating RN: Cornell Barman Primary Care Provider: Lynett Fish Other Clinician: Referring Provider: Lynett Fish Treating Provider/Extender: Frann Rider in Treatment: 5 Debridement Performed for Wound #1 Right,Posterior Lower Leg Assessment: Performed By: Physician Christin Fudge, MD Debridement: Open Wound/Selective Debridement Selective Description: Pre-procedure Yes - 13:37 Verification/Time Out Taken: Start Time: 13:38 Pain Control: Lidocaine 4% Topical Solution Level: Non-Viable Tissue Total Area Debrided (L x 1 (cm) x 1.5 (cm) = 1.5 (cm) W): Tissue and other Viable, Non-Viable, Exudate, Fibrin/Slough material debrided: Instrument: Forceps Bleeding: Minimum Hemostasis Achieved: Pressure End Time: 13:40 Procedural Pain: 0 Post Procedural Pain: 0 Response to Treatment: Procedure was tolerated well Post  Debridement Measurements of Total Wound Length: (cm) 0.1 Width: (cm) 0.1 Depth: (cm) 0.1 Volume: (cm) 0.001 Character of Wound/Ulcer Post Requires Further Debridement Debridement: Severity of Tissue Post Debridement: Limited to breakdown of skin Post Procedure Diagnosis Same as Pre-procedure Electronic Signature(s) Signed: 03/24/2017 10:47:58 AM By: Gretta Cool RN, BSN, Kim RN, BSN Signed: 03/25/2017 4:33:23 PM By: Christin Fudge MD, FACS [name], [name] ([id number]) Previous Signature: 02/18/2017 1:45:57 PM Version By: Christin Fudge MD, FACS Previous Signature: 02/18/2017 4:26:43 PM Version By: Alric Quan Entered By: Gretta Cool RN, BSN, Kim on 03/24/2017 26:37:85 [name] ([id number]) -------------------------------------------------------------------------------- HPI Details Patient Name: [name], [name]. Date of Service: 02/18/2017 1:30 PM Medical Record Number: [id number] Patient Account Number: [id number] Date of Birth/Sex: February 12, 1935 (82 y.o. Female) Treating RN: Carolyne Fiscal, [name] Primary Care Provider: Lynett Fish Other Clinician: Referring Provider: Lynett Fish Treating Provider/Extender: Frann Rider in Treatment: 5 History of Present Illness Location: right lower posterior calf Quality: Patient reports experiencing a sharp pain to affected area(s). Severity: Patient states wound are getting worse. Duration: Patient has had the wound for < 4 weeks prior to presenting for treatment Timing: Pain in wound is Intermittent (comes and goes Context: The wound occurred when the patient hit the back of her leg against a blunt object at home which opened out a wound Modifying Factors: Other treatment(s) tried include:Keflex and doxycycline by mouth Associated Signs and Symptoms: Patient reports having increase swelling. HPI Description: 82 year old patient who is known to have diabetes mellitus and tobacco abuse has had a injury to the right posterior calf about a  month ago. He has been treated with local care and 2 courses of antibiotics which include Keflex and doxycycline which she has completed. Smokes about a pack and a half of cigarettes a day and has a past medical history of COPD, diabetes mellitus, hyperlipidemia,psoriasis and hypothyroidism. 01/21/2017 -- she is still working on giving up smoking but is not inclined to do so. 01/28/2017 -- the patient is rather noncompliant and  has little desire to follow instructions. 02/04/2017 -- since my talk with her last week she has begun wearing her compression stockings. Electronic Signature(s) Signed: 02/18/2017 1:46:07 PM By: Christin Fudge MD, FACS Entered By: Christin Fudge on 02/18/2017 44:01:02 [name] ([id number]) -------------------------------------------------------------------------------- Physical Exam Details Patient Name: [name], [name]. Date of Service: 02/18/2017 1:30 PM Medical Record Number: [id number] Patient Account Number: [id number] Date of Birth/Sex: 1935/04/15 (82 y.o. Female) Treating RN: Carolyne Fiscal, [name] Primary Care Provider: Lynett Fish Other Clinician: Referring Provider: Lynett Fish Treating Provider/Extender: Frann Rider in Treatment: 5 Constitutional . Pulse regular. Respirations normal and unlabored. Afebrile. . Eyes Nonicteric. Reactive to light. Ears, Nose, Mouth, and Throat Lips, teeth, and gums WNL.Marland Kitchen Moist mucosa without lesions. Neck supple and nontender. No palpable supraclavicular or cervical adenopathy. Normal sized without goiter. Respiratory WNL. No retractions.. Breath sounds WNL, No rubs, rales, rhonchi, or wheeze.. Cardiovascular Heart rhythm and rate regular, no murmur or gallop.. Pedal Pulses WNL. No clubbing, cyanosis or edema. Chest Breasts symmetical and no nipple discharge.. Breast tissue WNL, no masses, lumps, or tenderness.. Lymphatic No adneopathy. No adenopathy. No adenopathy. Musculoskeletal Adexa without  tenderness or enlargement.. Digits and nails w/o clubbing, cyanosis, infection, petechiae, ischemia, or inflammatory conditions.. Integumentary (Hair, Skin) No suspicious lesions. No crepitus or fluctuance. No peri-wound warmth or erythema. No masses.Marland Kitchen Psychiatric Judgement and insight Intact.. No evidence of depression, anxiety, or agitation.. Notes she had significant eschar over the wound and once this was removed with a toothed forcep after adequate local analgesia and moisture and was able to see healthy healed epithelialized skin. Electronic Signature(s) Signed: 02/18/2017 1:46:50 PM By: Christin Fudge MD, FACS Entered By: Christin Fudge on 02/18/2017 38:75:64 [name], [name] ([id number]) -------------------------------------------------------------------------------- Physician Orders Details Patient Name: [name] Date of Service: 02/18/2017 1:30 PM Medical Record Number: [id number] Patient Account Number: [id number] Date of Birth/Sex: 1935-05-15 (82 y.o. Female) Treating RN: Carolyne Fiscal, [name] Primary Care Provider: Lynett Fish Other Clinician: Referring Provider: Lynett Fish Treating Provider/Extender: Frann Rider in Treatment: 5 Verbal / Phone Orders: Yes Clinician: Pinkerton, [name] Read Back and Verified: Yes Diagnosis Coding Wound Cleansing Wound #1 Right,Posterior Lower Leg o Clean wound with Normal Saline. o Cleanse wound with mild soap and water o May Shower, gently pat wound dry prior to applying new dressing. Anesthetic Wound #1 Right,Posterior Lower Leg o Topical Lidocaine 4% cream applied to wound bed prior to debridement Skin Barriers/Peri-Wound Care Wound #1 Right,Posterior Lower Leg o Skin Prep Secondary Dressing Wound #1 Right,Posterior Lower Leg o Dry Gauze o Boardered Foam Dressing Dressing Change Frequency Wound #1 Right,Posterior Lower Leg o Change dressing every other day. Follow-up Appointments Wound #1  Right,Posterior Lower Leg o Return Appointment in 1 week. Edema Control Wound #1 Right,Posterior Lower Leg o Elevate legs to the level of the heart and pump ankles as often as possible o Support Garment 10-20 mm/Hg pressure to: - Patient to pick up 15-75mmhg stockings for her right leg. Additional Orders / Instructions Wound #1 Right,Posterior Lower Leg Parlier, Jennene O. ([id number]) o Stop Smoking o Increase protein intake. Medications-please add to medication list. Wound #1 Right,Posterior Lower Leg o Other: - vitamin c, zinc, MVI Electronic Signature(s) Signed: 02/18/2017 4:19:30 PM By: Christin Fudge MD, FACS Signed: 02/18/2017 4:26:43 PM By: Alric Quan Entered By: Alric Quan on 02/18/2017 73:22:02 [name], [name] ([id number]) -------------------------------------------------------------------------------- Problem List Details Patient Name: [name], [name] Date of Service: 02/18/2017 1:30 PM Medical Record Number: [id number] Patient Account Number:  [id number] Date of Birth/Sex: 05/20/1935 (82 y.o. 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 Date of Service: 02/18/2017 1:30 PM Medical Record Number: [id number] Patient Account Number: [id number] Date of Birth/Sex: 11-16-1935 (82 y.o. Female) Treating RN: Carolyne Fiscal, [name] Primary Care Provider: Lynett Fish Other Clinician: Referring Provider: Lynett Fish Treating Provider/Extender: Frann Rider in Treatment: 5 Subjective Chief Complaint Information obtained from Patient Patients presents for treatment of an open diabetic ulcer to the right lower extremity which is had for about a month History of Present Illness (HPI) The following HPI elements were documented for the patient's wound: Location: right lower posterior calf Quality: Patient reports experiencing a sharp pain to affected area(s). Severity: Patient states wound are getting worse. Duration: Patient has had the wound for < 4 weeks prior to presenting for treatment Timing: Pain in wound is Intermittent (comes and goes Context: The wound occurred when the patient hit the back of her leg against a blunt object at home which opened out a wound Modifying Factors: Other treatment(s) tried include:Keflex and doxycycline by mouth Associated Signs and Symptoms: Patient reports having increase swelling. 82 year old patient who is known to have diabetes mellitus and tobacco abuse has had a injury to the right posterior calf about a month ago. He has been treated with local care and 2 courses of antibiotics which include Keflex and doxycycline which she has completed. Smokes about a pack and a half of cigarettes a day and has a past medical history of COPD, diabetes mellitus, hyperlipidemia,psoriasis and hypothyroidism. 01/21/2017 -- she is still working on giving up smoking but is not inclined to do so. 01/28/2017 -- the patient is rather noncompliant and has little desire to follow instructions. 02/04/2017 -- since my talk with her last week she has begun wearing her  compression stockings. Objective Constitutional Pulse regular. Respirations normal and unlabored. Afebrile. Vitals Time Taken: 1:31 PM, Height: 64 in, Weight: 142.7 lbs, BMI: 24.5, Temperature: 97.7 F, Pulse: 96 bpm, Respiratory Rate: 18 breaths/min, Blood Pressure: 159/65 mmHg. FAYLYNN, STAMOS ([id number]) Eyes Nonicteric. Reactive to light. Ears, Nose, Mouth, and Throat Lips, teeth, and gums WNL.Marland Kitchen Moist mucosa without lesions. Neck supple and nontender. No palpable supraclavicular or cervical adenopathy. Normal sized without goiter. Respiratory WNL. No retractions.. Breath sounds WNL, No rubs, rales, rhonchi, or wheeze.. Cardiovascular Heart rhythm and rate regular, no murmur or gallop.. Pedal Pulses WNL. No clubbing, cyanosis or edema. Chest Breasts symmetical and no nipple discharge.. Breast tissue WNL, no masses, lumps, or tenderness.. Lymphatic No adneopathy. No adenopathy. No adenopathy. Musculoskeletal Adexa without tenderness or enlargement.. Digits and nails w/o clubbing, cyanosis, infection, petechiae, ischemia, or inflammatory conditions.Marland Kitchen Psychiatric Judgement and insight Intact.. No evidence of depression, anxiety, or agitation.. General Notes: she had significant eschar over the wound and once this was removed with a toothed forcep after adequate local analgesia and moisture and was able to  see healthy healed epithelialized skin. Integumentary (Hair, Skin) No suspicious lesions. No crepitus or fluctuance. No peri-wound warmth or erythema. No masses.. Wound #1 status is Open. Original cause of wound was Trauma. The wound is located on the Right,Posterior Lower Leg. The wound measures 1cm length x 1.5cm width x 0.1cm depth; 1.178cm^2 area and 0.118cm^3 volume. There is Fat Layer (Subcutaneous Tissue) Exposed exposed. There is no tunneling or undermining noted. There is a large amount of serosanguineous drainage noted. The wound margin is distinct with the outline  attached to the wound base. There is no granulation within the wound bed. There is a large (67-100%) amount of necrotic tissue within the wound bed including Eschar and Adherent Slough. The periwound skin appearance exhibited: Excoriation, Erythema. The surrounding wound skin color is noted with erythema which is circumferential. Periwound temperature was noted as No Abnormality. The periwound has tenderness on palpation. 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 There was a Non-Viable Tissue Open Wound/Selective [phone]) debridement with total area of 1.5 sq cm performed by Christin Fudge, MD. with the following instrument(s): Forceps to remove Viable and Non- Viable tissue/material including Exudate and Fibrin/Slough after achieving pain control using Lidocaine 4% Topical Solution. A time out was conducted at 13:37, prior to the start of the procedure. A Minimum amount of bleeding was controlled with Pressure. The procedure was tolerated well with a pain level of 0 throughout and a pain level of 0 following the procedure. Post Debridement Measurements: 0.1cm length x 0.1cm width x 0.1cm depth; 0.001cm^3 volume. Character of Wound/Ulcer Post Debridement requires further debridement. Severity of Tissue Post Debridement is: Limited to breakdown of skin. Post procedure Diagnosis Wound #1: Same as Pre-Procedure Plan Wound  Cleansing: Wound #1 Right,Posterior Lower Leg: Clean wound with Normal Saline. Cleanse wound with mild soap and water May Shower, gently pat wound dry prior to applying new dressing. Anesthetic: Wound #1 Right,Posterior Lower Leg: Topical Lidocaine 4% cream applied to wound bed prior to debridement Skin Barriers/Peri-Wound Care: Wound #1 Right,Posterior Lower Leg: KIMARA, BENCOMO ([id number]) Skin Prep Secondary Dressing: Wound #1 Right,Posterior Lower Leg: Dry Gauze Boardered Foam Dressing Dressing Change Frequency: Wound #1 Right,Posterior Lower Leg: Change dressing every other day. Follow-up Appointments: Wound #1 Right,Posterior Lower Leg: Return Appointment in 1 week. Edema Control: Wound #1 Right,Posterior Lower Leg: Elevate legs to the level of the heart and pump ankles as often as possible Support Garment 10-20 mm/Hg pressure to: - Patient to pick up 15-26mmhg stockings for her right leg. Additional Orders / Instructions: Wound #1 Right,Posterior Lower Leg: Stop Smoking Increase protein intake. Medications-please add to medication list.: Wound #1 Right,Posterior Lower Leg: Other: - vitamin c, zinc, MVI I'm pleased with the progressed and at this stage have recommended a foam covering with Kerlix and will review her back next week and anticipate discharge soon Electronic Signature(s) Signed: 03/31/2017 11:47:17 AM By: Gretta Cool RN, BSN, Kim RN, BSN Signed: 04/13/2017 12:41:57 PM By: Christin Fudge MD, FACS Previous Signature: 02/18/2017 1:47:28 PM Version  By: Christin Fudge MD, FACS Entered By: Gretta Cool, RN, BSN, Kim on 03/31/2017 40:37:09 Rankin, [name] ([id number]) -------------------------------------------------------------------------------- [facility] [place] Details Patient Name: [name] Date of Service: 02/18/2017 Medical Record Number: [id number] Patient Account Number: [id number] Date of Birth/Sex: [birth year] (82 y.o. Female) Treating RN: Carolyne Fiscal, [name] Primary Care  Provider: Lynett Fish Other Clinician: Referring Provider: Lynett Fish Treating Provider/Extender: Frann Rider in Treatment: 5 Diagnosis Coding ICD-10 Codes Code Description [phone] Type 2 diabetes mellitus with other skin ulcer L97.212 Non-pressure chronic ulcer of right calf with fat layer exposed I89.0 Lymphedema, not elsewhere classified F17.218 Nicotine dependence, cigarettes, with other nicotine-induced disorders Facility Procedures CPT4 Code: [id number] Description: [phone] - DEBRIDE WOUND 1ST 20 SQ CM OR < ICD-10 Description Diagnosis E11.622 Type 2 diabetes mellitus with other skin ulcer L97.212 Non-pressure chronic ulcer of right calf with fat l I89.0 Lymphedema, not elsewhere classified Modifier: ayer exposed Quantity: 1 Physician Procedures CPT4 Code: [id number] Description: 97597 - WC PHYS DEBR WO ANESTH 20 SQ CM ICD-10 Description Diagnosis E11.622 Type 2 diabetes mellitus with other skin ulcer L97.212 Non-pressure chronic ulcer of right calf with fat l I89.0 Lymphedema, not elsewhere classified Modifier: ayer exposed Quantity: 1 Electronic Signature(s) Signed: 02/18/2017 1:47:55 PM By: Christin Fudge MD, FACS Entered By: Christin Fudge on 02/18/2017 13:47:55

## 2017-02-19 NOTE — Progress Notes (Signed)
[name], [name] ([id number]) Visit Report for 02/18/2017 Arrival Information Details Patient Name: [name], [name]. Date of Service: 02/18/2017 1:30 PM Medical Record Number: [id number] Patient Account Number: [id number] Date of Birth/Sex: 02/23/1935 (81 y.o. Female) Treating RN: Carolyne Fiscal, [name] Primary Care Ruffus Kamaka: Lynett Fish Other Clinician: Referring Bronson Bressman: Lynett Fish Treating Somtochukwu Woollard/Extender: Frann Rider in Treatment: 5 Visit Information History Since Last Visit All ordered tests and consults were completed: No Patient Arrived: Ambulatory Added or deleted any medications: No Arrival Time: 13:25 Any new allergies or adverse reactions: No Accompanied By: self Had a fall or experienced change in No Transfer Assistance: None activities of daily living that may affect Patient Identification Verified: Yes risk of falls: Secondary Verification Process Yes Signs or symptoms of abuse/neglect since last No Completed: visito Patient Requires Transmission-Based No Hospitalized since last visit: No Precautions: Has Dressing in Place as Prescribed: Yes Patient Has Alerts: Yes Has Compression in Place as Prescribed: Yes Patient Alerts: DM II Pain Present Now: No Electronic Signature(s) Signed: 02/18/2017 4:26:43 PM By: Alric Quan Entered By: Alric Quan on 02/18/2017 86:76:72 [name], [name] ([id number]) -------------------------------------------------------------------------------- Encounter Discharge Information Details Patient Name: [name]. Date of Service: 02/18/2017 1:30 PM Medical Record Number: [id number] Patient Account Number: [id number] Date of Birth/Sex: Oct 23, 1935 (81 y.o. Female) Treating RN: Carolyne Fiscal, [name] Primary Care Eluterio Seymour: Lynett Fish Other Clinician: Referring Elexius Minar: Lynett Fish Treating Andree Golphin/Extender: Frann Rider in Treatment: 5 Encounter Discharge Information Items Discharge Pain Level:  0 Discharge Condition: Stable Ambulatory Status: Ambulatory Discharge Destination: Home Transportation: Private Auto Accompanied By: self Schedule Follow-up Appointment: Yes Medication Reconciliation completed and provided to Patient/Care No Derward Marple: Provided on Clinical Summary of Care: 02/18/2017 Form Type Recipient Paper Patient AM Electronic Signature(s) Signed: 02/18/2017 1:47:13 PM By: Ruthine Dose Entered By: Ruthine Dose on 02/18/2017 46:50:35 Beretta, [name] ([id number]) -------------------------------------------------------------------------------- Lower Extremity Assessment Details Patient Name: [name], [name]. Date of Service: 02/18/2017 1:30 PM Medical Record Number: [id number] Patient Account Number: [id number] Date of Birth/Sex: November 04, 1935 (81 y.o. Female) Treating RN: Carolyne Fiscal, [name] Primary Care Sankalp Ferrell: Lynett Fish Other Clinician: Referring Darnesha Diloreto: Lynett Fish Treating Sultana Tierney/Extender: Frann Rider in Treatment: 5 Vascular Assessment Pulses: Dorsalis Pedis Palpable: [Right:Yes] Posterior Tibial Extremity colors, hair growth, and conditions: Extremity Color: [Right:Normal] Temperature of Extremity: [Right:Warm] Capillary Refill: [Right:< 3 seconds] Electronic Signature(s) Signed: 02/18/2017 4:26:43 PM By: Alric Quan Entered By: Alric Quan on 02/18/2017 49:67:59 [name], [name] ([id number]) -------------------------------------------------------------------------------- Multi Wound Chart Details Patient Name: [name], [name]. Date of Service: 02/18/2017 1:30 PM Medical Record Number: [id number] Patient Account Number: [id number] Date of Birth/Sex: November 16, 1935 (81 y.o. Female) Treating RN: Carolyne Fiscal, [name] Primary Care Jahad Old: Lynett Fish Other Clinician: Referring Aydrien Froman: Lynett Fish Treating Aribella Vavra/Extender: Frann Rider in Treatment: 5 Vital Signs Height(in): 64 Pulse(bpm): 96 Weight(lbs):  142.7 Blood Pressure 159/65 (mmHg): Body Mass Index(BMI): 24 Temperature(F): 97.7 Respiratory Rate 18 (breaths/min): Photos: [1:No Photos] [N/A:N/A] Wound Location: [1:Right Lower Leg - Posterior] [N/A:N/A] Wounding Event: [1:Trauma] [N/A:N/A] Primary Etiology: [1:Diabetic Wound/Ulcer of N/A the Lower Extremity] Secondary Etiology: [1:Trauma, Other] [N/A:N/A] Comorbid History: [1:Cataracts, Anemia, Chronic Obstructive Pulmonary Disease (COPD), Type II Diabetes, Received Radiation] [N/A:N/A] Date Acquired: [1:12/17/2016] [N/A:N/A] Weeks of Treatment: [1:5] [N/A:N/A] Wound Status: [1:Open] [N/A:N/A] Measurements L x W x D 1x1.5x0.1 [N/A:N/A] (cm) Area (cm) : [1:1.178] [N/A:N/A] Volume (cm) : [1:0.118] [N/A:N/A] % Reduction in Area: [1:53.10%] [N/A:N/A] % Reduction in Volume: 53.00% [N/A:N/A] Classification: [1:Grade 2] [N/A:N/A] Exudate Amount: [1:Large] [N/A:N/A] Exudate Type: [  1:Serosanguineous] [N/A:N/A] Exudate Color: [1:red, brown] [N/A:N/A] Wound Margin: [1:Distinct, outline attached N/A] Granulation Amount: [1:None Present (0%)] [N/A:N/A] Necrotic Amount: [1:Large (67-100%)] [N/A:N/A] Necrotic Tissue: [1:Eschar, Adherent Slough N/A] Exposed Structures: [N/A:N/A] Fat Layer (Subcutaneous Tissue) Exposed: Yes Fascia: No Tendon: No Muscle: No Joint: No Bone: No Epithelialization: Small (1-33%) N/A N/A Debridement: Debridement (98921- N/A N/A 11047) Pre-procedure 13:37 N/A N/A Verification/Time Out Taken: Pain Control: Lidocaine 4% Topical N/A N/A Solution Tissue Debrided: Fibrin/Slough, Exudates, N/A N/A Subcutaneous Level: Skin/Subcutaneous N/A N/A Tissue Debridement Area (sq 1.5 N/A N/A cm): Instrument: Forceps N/A N/A Bleeding: Minimum N/A N/A Hemostasis Achieved: Pressure N/A N/A Procedural Pain: 0 N/A N/A Post Procedural Pain: 0 N/A N/A Debridement Treatment Procedure was tolerated N/A N/A Response: well Post Debridement 0.1x0.1x0.1 N/A  N/A Measurements L x W x D (cm) Post Debridement 0.001 N/A N/A Volume: (cm) Periwound Skin Texture: Excoriation: Yes N/A N/A Periwound Skin No Abnormalities Noted N/A N/A Moisture: Periwound Skin Color: Erythema: Yes N/A N/A Erythema Location: Circumferential N/A N/A Temperature: No Abnormality N/A N/A Tenderness on Yes N/A N/A Palpation: Wound Preparation: Ulcer Cleansing: N/A N/A Rinsed/Irrigated with Saline Topical Anesthetic Applied: Other: lidocaine 4% Procedures Performed: Debridement N/A N/A PAIDEN, CARAVEO ([id number]) Treatment Notes Wound #1 (Right, Posterior Lower Leg) 1. Cleansed with: Clean wound with Normal Saline 2. Anesthetic Topical Lidocaine 4% cream to wound bed prior to debridement 5. Secondary Dressing Applied Bordered Foam Dressing Dry Gauze Electronic Signature(s) Signed: 02/18/2017 1:45:27 PM By: Christin Fudge MD, FACS Entered By: Christin Fudge on 02/18/2017 44:81:85 Koller, [name] ([id number]) -------------------------------------------------------------------------------- Multi-Disciplinary Care Plan Details Patient Name: LAKIA, GRITTON. Date of Service: 02/18/2017 1:30 PM Medical Record Number: [id number] Patient Account Number: [id number] Date of Birth/Sex: 1935-11-02 (81 y.o. Female) Treating RN: Carolyne Fiscal, [name] Primary Care Teandre Hamre: Lynett Fish Other Clinician: Referring Preslie Depasquale: Lynett Fish Treating Lelon Ikard/Extender: Frann Rider in Treatment: 5 Active Inactive ` Nutrition Nursing Diagnoses: Imbalanced nutrition Impaired glucose control: actual or potential Potential for alteratiion in Nutrition/Potential for imbalanced nutrition Goals: Patient/caregiver agrees to and verbalizes understanding of need to use nutritional supplements and/or vitamins as prescribed Date Initiated: 01/14/2017 Target Resolution Date: 03/20/2017 Goal Status: Active Interventions: Assess patient nutrition upon admission and as needed per  policy Provide education on elevated blood sugars and impact on wound healing Notes: ` Orientation to the Wound Care Program Nursing Diagnoses: Knowledge deficit related to the wound healing center program Goals: Patient/caregiver will verbalize understanding of the [facility] Date Initiated: 01/14/2017 Target Resolution Date: 02/20/2017 Goal Status: Active Interventions: Provide education on orientation to the wound center Notes: ` Pain, Acute or Chronic Ciresi, Jiya Jenetta [name] ([id number]) Nursing Diagnoses: Pain, acute or chronic: actual or potential Potential alteration in comfort, pain Goals: Patient will verbalize adequate pain control and receive pain control interventions during procedures as needed Date Initiated: 01/14/2017 Target Resolution Date: 03/20/2017 Goal Status: Active Interventions: Assess comfort goal upon admission Complete pain assessment as per visit requirements Notes: ` Wound/Skin Impairment Nursing Diagnoses: Impaired tissue integrity Knowledge deficit related to smoking impact on wound healing Knowledge deficit related to ulceration/compromised skin integrity Goals: Ulcer/skin breakdown will have a volume reduction of 80% by week 12 Date Initiated: 01/14/2017 Target Resolution Date: 03/20/2017 Goal Status: Active Interventions: Assess patient/caregiver ability to perform ulcer/skin care regimen upon admission and as needed Assess ulceration(s) every visit Provide education on smoking Notes: Electronic Signature(s) Signed: 02/18/2017 4:26:43 PM By: Alric Quan Entered By: Alric Quan on 02/18/2017 76:72:09 Bircher, [name] ([id number]) --------------------------------------------------------------------------------  Pain Assessment Details Patient Name: [name], [name]. Date of Service: 02/18/2017 1:30 PM Medical Record Number: [id number] Patient Account Number: [id number] Date of Birth/Sex: 18-Jul-1935 (81 y.o. Female) Treating RN:  Carolyne Fiscal, [name] Primary Care Nneka Blanda: Lynett Fish Other Clinician: Referring Jermario Kalmar: Lynett Fish Treating Devani Odonnel/Extender: Frann Rider in Treatment: 5 Active Problems Location of Pain Severity and Description of Pain Patient Has Paino No Site Locations With Dressing Change: No Pain Management and Medication Current Pain Management: Electronic Signature(s) Signed: 02/18/2017 4:26:43 PM By: Alric Quan Entered By: Alric Quan on 02/18/2017 85:27:78 Diles, [name] ([id number]) -------------------------------------------------------------------------------- Patient/Caregiver Education Details Patient Name: [name] Date of Service: 02/18/2017 1:30 PM Medical Record Number: [id number] Patient Account Number: [id number] Date of Birth/Gender: 05/09/1935 (81 y.o. Female) Treating RN: Ahmed Prima Primary Care Physician: Lynett Fish Other Clinician: Referring Physician: Lynett Fish Treating Physician/Extender: Frann Rider in Treatment: 5 Education Assessment Education Provided To: Patient Education Topics Provided Wound/Skin Impairment: Handouts: Other: change dressing as ordered Methods: Demonstration, Explain/Verbal Responses: State content correctly Electronic Signature(s) Signed: 02/18/2017 4:26:43 PM By: Alric Quan Entered By: Alric Quan on 02/18/2017 76:19:50 Olivet, [name] ([id number]) -------------------------------------------------------------------------------- Wound Assessment Details Patient Name: Gilmartin, [name]. Date of Service: 02/18/2017 1:30 PM Medical Record Number: [id number] Patient Account Number: [id number] Date of Birth/Sex: 24-Jun-1935 (81 y.o. Female) Treating RN: Carolyne Fiscal, [name] Primary Care Oswin Johal: Lynett Fish Other Clinician: Referring Lun Muro: Lynett Fish Treating Kenedi Cilia/Extender: Frann Rider in Treatment: 5 Wound Status Wound Number: 1 Primary Diabetic  Wound/Ulcer of the Lower Etiology: Extremity Wound Location: Right Lower Leg - Posterior Secondary Trauma, Other Wounding Event: Trauma Etiology: Date Acquired: 12/17/2016 Wound Open Weeks Of Treatment: 5 Status: Clustered Wound: No Comorbid Cataracts, Anemia, Chronic History: Obstructive Pulmonary Disease (COPD), Type II Diabetes, Received Radiation Photos Photo Uploaded By: Alric Quan on 02/18/2017 14:50:05 Wound Measurements Length: (cm) 1 Width: (cm) 1.5 Depth: (cm) 0.1 Area: (cm) 1.178 Volume: (cm) 0.118 % Reduction in Area: 53.1% % Reduction in Volume: 53% Epithelialization: Small (1-33%) Tunneling: No Undermining: No Wound Description Classification: Grade 2 Wound Margin: Distinct, outline attached Exudate Amount: Large Exudate Type: Serosanguineous Exudate Color: red, brown Foul Odor After Cleansing: No Slough/Fibrino No Wound Bed Granulation Amount: None Present (0%) Exposed Structure Mansel, Mishael O. ([id number]) Necrotic Amount: Large (67-100%) Fascia Exposed: No Necrotic Quality: Eschar, Adherent Slough Fat Layer (Subcutaneous Tissue) Exposed: Yes Tendon Exposed: No Muscle Exposed: No Joint Exposed: No Bone Exposed: No Periwound Skin Texture Texture Color No Abnormalities Noted: No No Abnormalities Noted: No Excoriation: Yes Erythema: Yes Erythema Location: Circumferential Moisture No Abnormalities Noted: No Temperature / Pain Temperature: No Abnormality Tenderness on Palpation: Yes Wound Preparation Ulcer Cleansing: Rinsed/Irrigated with Saline Topical Anesthetic Applied: Other: lidocaine 4%, Treatment Notes Wound #1 (Right, Posterior Lower Leg) 1. Cleansed with: Clean wound with Normal Saline 2. Anesthetic Topical Lidocaine 4% cream to wound bed prior to debridement 5. Secondary Dressing Applied Bordered Foam Dressing Dry Gauze Electronic Signature(s) Signed: 02/18/2017 4:26:43 PM By: Alric Quan Entered By: Alric Quan on 02/18/2017 41:93:79 Clum, [name] ([id number]) -------------------------------------------------------------------------------- Vitals Details Patient Name: Pieroni, [name]. Date of Service: 02/18/2017 1:30 PM Medical Record Number: [id number] Patient Account Number: [id number] Date of Birth/Sex: 12-Apr-1935 (81 y.o. Female) Treating RN: Carolyne Fiscal, [name] Primary Care Celinda Dethlefs: Lynett Fish Other Clinician: Referring Jaleeya Mcnelly: Lynett Fish Treating Kiersten Coss/Extender: Frann Rider in Treatment: 5 Vital Signs Time Taken: 13:31 Temperature (F): 97.7 Height (in): 64 Pulse (bpm): 96 Weight (lbs):  142.7 Respiratory Rate (breaths/min): 18 Body Mass Index (BMI): 24.5 Blood Pressure (mmHg): 159/65 Reference Range: 80 - 120 mg / dl Electronic Signature(s) Signed: 02/18/2017 4:26:43 PM By: Alric Quan Entered By: Alric Quan on 02/18/2017 13:33:11

## 2017-02-25 ENCOUNTER — Encounter: Payer: Medicare Other | Admitting: Surgery

## 2017-02-25 DIAGNOSIS — E11622 Type 2 diabetes mellitus with other skin ulcer: Secondary | ICD-10-CM | POA: Diagnosis not present

## 2017-02-26 NOTE — Progress Notes (Signed)
SHAYNNA, HUSBY ([id number]) Visit Report for 02/25/2017 Arrival Information Details Patient Name: [name], [name]. Date of Service: 02/25/2017 1:30 PM Medical Record Number: [id number] Patient Account Number: [id number] Date of Birth/Sex: May 15, 1935 (82 y.o. Female) Treating RN: Cornell Barman Primary Care Brixon Zhen: Lynett Fish Other Clinician: Referring Antwonette Feliz: Lynett Fish Treating Lavender Stanke/Extender: Frann Rider in Treatment: 6 Visit Information History Since Last Visit Added or deleted any medications: No Patient Arrived: Ambulatory Any new allergies or adverse reactions: No Arrival Time: 13:27 Had a fall or experienced change in No Accompanied By: self activities of daily living that may affect Transfer Assistance: None risk of falls: Patient Identification Verified: Yes Signs or symptoms of abuse/neglect since last No Secondary Verification Process Yes visito Completed: Hospitalized since last visit: No Patient Requires Transmission-Based No Has Dressing in Place as Prescribed: Yes Precautions: Pain Present Now: No Patient Has Alerts: Yes Patient Alerts: DM II Electronic Signature(s) Signed: 02/25/2017 5:26:13 PM By: Gretta Cool, RN, BSN, Kim RN, BSN Entered By: Gretta Cool, RN, BSN, Kim on 02/25/2017 20:25:42 Oyervides, [name] ([id number]) -------------------------------------------------------------------------------- Clinic Level of Care Assessment Details Patient Name: Tullos, [name]. Date of Service: 02/25/2017 1:30 PM Medical Record Number: [id number] Patient Account Number: [id number] Date of Birth/Sex: 06-02-1935 (82 y.o. Female) Treating RN: Cornell Barman Primary Care Alanys Godino: Lynett Fish Other Clinician: Referring Ena Demary: Lynett Fish Treating Takiera Mayo/Extender: Frann Rider in Treatment: 6 Clinic Level of Care Assessment Items TOOL 4 Quantity Score []  - Use when only an EandM is performed on FOLLOW-UP visit 0 ASSESSMENTS - Nursing  Assessment / Reassessment []  - Reassessment of Co-morbidities (includes updates in patient status) 0 X - Reassessment of Adherence to Treatment Plan 1 5 ASSESSMENTS - Wound and Skin Assessment / Reassessment X - Simple Wound Assessment / Reassessment - one wound 1 5 []  - Complex Wound Assessment / Reassessment - multiple wounds 0 []  - Dermatologic / Skin Assessment (not related to wound area) 0 ASSESSMENTS - Focused Assessment []  - Circumferential Edema Measurements - multi extremities 0 []  - Nutritional Assessment / Counseling / Intervention 0 []  - Lower Extremity Assessment (monofilament, tuning fork, pulses) 0 []  - Peripheral Arterial Disease Assessment (using hand held doppler) 0 ASSESSMENTS - Ostomy and/or Continence Assessment and Care []  - Incontinence Assessment and Management 0 []  - Ostomy Care Assessment and Management (repouching, etc.) 0 PROCESS - Coordination of Care X - Simple Patient / Family Education for ongoing care 1 15 []  - Complex (extensive) Patient / Family Education for ongoing care 0 []  - Staff obtains Programmer, systems, Records, Test Results / Process Orders 0 []  - Staff telephones HHA, Nursing Homes / Clarify orders / etc 0 []  - Routine Transfer to another Facility (non-emergent condition) 0 Saiki, Jailynne O. ([id number]) []  - Routine Hospital Admission (non-emergent condition) 0 []  - New Admissions / Biomedical engineer / Ordering NPWT, Apligraf, etc. 0 []  - Emergency Hospital Admission (emergent condition) 0 X - Simple Discharge Coordination 1 10 []  - Complex (extensive) Discharge Coordination 0 PROCESS - Special Needs []  - Pediatric / Minor Patient Management 0 []  - Isolation Patient Management 0 []  - Hearing / Language / Visual special needs 0 []  - Assessment of Community assistance (transportation, D/C planning, etc.) 0 []  - Additional assistance / Altered mentation 0 []  - Support Surface(s) Assessment (bed, cushion, seat, etc.) 0 INTERVENTIONS - Wound  Cleansing / Measurement []  - Simple Wound Cleansing - one wound 0 []  - Complex Wound Cleansing - multiple wounds 0 X - Wound  Imaging (photographs - any number of wounds) 1 5 []  - Wound Tracing (instead of photographs) 0 []  - Simple Wound Measurement - one wound 0 []  - Complex Wound Measurement - multiple wounds 0 INTERVENTIONS - Wound Dressings X - Small Wound Dressing one or multiple wounds 1 10 []  - Medium Wound Dressing one or multiple wounds 0 []  - Large Wound Dressing one or multiple wounds 0 []  - Application of Medications - topical 0 []  - Application of Medications - injection 0 INTERVENTIONS - Miscellaneous []  - External ear exam 0 Fuston, Britania O. ([id number]) []  - Specimen Collection (cultures, biopsies, blood, body fluids, etc.) 0 []  - Specimen(s) / Culture(s) sent or taken to Lab for analysis 0 []  - Patient Transfer (multiple staff / Harrel Lemon Lift / Similar devices) 0 []  - Simple Staple / Suture removal (25 or less) 0 []  - Complex Staple / Suture removal (26 or more) 0 []  - Hypo / Hyperglycemic Management (close monitor of Blood Glucose) 0 []  - Ankle / Brachial Index (ABI) - do not check if billed separately 0 X - Vital Signs 1 5 Has the patient been seen at the hospital within the last three years: Yes Total Score: 55 Level Of Care: New/Established - Level 2 Electronic Signature(s) Signed: 02/25/2017 5:26:13 PM By: Gretta Cool, RN, BSN, Kim RN, BSN Entered By: Gretta Cool, RN, BSN, Kim on 02/25/2017 47:82:95 Teicher, [name] ([id number]) -------------------------------------------------------------------------------- Encounter Discharge Information Details Patient Name: [name]. Date of Service: 02/25/2017 1:30 PM Medical Record Number: [id number] Patient Account Number: [id number] Date of Birth/Sex: [birth year] (82 y.o. Female) Treating RN: Cornell Barman Primary Care Zaion Hreha: Lynett Fish Other Clinician: Referring Daniela Hernan: Lynett Fish Treating Kaitlynd Phillips/Extender:  Frann Rider in Treatment: 6 Encounter Discharge Information Items Discharge Pain Level: 0 Discharge Condition: Stable Ambulatory Status: Ambulatory Discharge Destination: Home Transportation: Private Auto Accompanied By: self Schedule Follow-up Appointment: Yes Medication Reconciliation completed and provided to Patient/Care Yes Millard Bautch: Provided on Clinical Summary of Care: 02/25/2017 Form Type Recipient Paper Patient AM Electronic Signature(s) Signed: 02/25/2017 1:55:44 PM By: Ruthine Dose Entered By: Ruthine Dose on 02/25/2017 84:13:24 Gangi, Aeliana Jenetta Downer ([id number]) -------------------------------------------------------------------------------- Lower Extremity Assessment Details Patient Name: Awad, [name]. Date of Service: 02/25/2017 1:30 PM Medical Record Number: [id number] Patient Account Number: [id number] Date of Birth/Sex: 03-20-1935 (81 y.o. Female) Treating RN: Cornell Barman Primary Care Breylen Agyeman: Lynett Fish Other Clinician: Referring Bonnie Roig: Lynett Fish Treating Blanchard Willhite/Extender: Frann Rider in Treatment: 6 Edema Assessment Assessed: [Left: No] [Right: No] Edema: [Left: N] [Right: o] Vascular Assessment Pulses: Dorsalis Pedis Palpable: [Left:Yes] Posterior Tibial Palpable: [Left:Yes] Extremity colors, hair growth, and conditions: Extremity Color: [Left:Normal] Hair Growth on Extremity: [Left:No] Temperature of Extremity: [Left:Warm] Capillary Refill: [Left:< 3 seconds] Dependent Rubor: [Left:No] Blanched when Elevated: [Left:No] Toe Nail Assessment Left: Right: Thick: No Discolored: No Deformed: No Improper Length and Hygiene: No Electronic Signature(s) Signed: 02/25/2017 5:26:13 PM By: Gretta Cool, RN, BSN, Kim RN, BSN Entered By: Gretta Cool, RN, BSN, Kim on 02/25/2017 25:95:63 Knauff, [name] ([id number]) -------------------------------------------------------------------------------- Multi Wound Chart Details Patient Name:  [name]. Date of Service: 02/25/2017 1:30 PM Medical Record Number: [id number] Patient Account Number: [id number] Date of Birth/Sex: 12/02/1935 (81 y.o. Female) Treating RN: Cornell Barman Primary Care Dorr Perrot: Lynett Fish Other Clinician: Referring Joseangel Nettleton: Lynett Fish Treating Hendy Brindle/Extender: Frann Rider in Treatment: 6 Vital Signs Height(in): 64 Pulse(bpm): 95 Weight(lbs): 142.7 Blood Pressure 150/59 (mmHg): Body Mass Index(BMI): 24 Temperature(F): 97.8 Respiratory Rate 16 (breaths/min): Photos: [N/A:N/A] Wound  Location: Right, Posterior Lower N/A N/A Leg Wounding Event: Trauma N/A N/A Primary Etiology: Diabetic Wound/Ulcer of N/A N/A the Lower Extremity Secondary Etiology: Trauma, Other N/A N/A Comorbid History: Cataracts, Anemia, N/A N/A Chronic Obstructive Pulmonary Disease (COPD), Type II Diabetes, Received Radiation Date Acquired: 12/17/2016 N/A N/A Weeks of Treatment: 6 N/A N/A Wound Status: Healed - Epithelialized N/A N/A Measurements L x W x D 0x0x0 N/A N/A (cm) Area (cm) : 0 N/A N/A Volume (cm) : 0 N/A N/A % Reduction in Area: 100.00% N/A N/A % Reduction in Volume: 100.00% N/A N/A Classification: Grade 2 N/A N/A Exudate Amount: Large N/A N/A Exudate Type: Serosanguineous N/A N/A Exudate Color: red, brown N/A N/A Groner, Pinky O. ([id number]) Wound Margin: Distinct, outline attached N/A N/A Granulation Amount: None Present (0%) N/A N/A Necrotic Amount: Large (67-100%) N/A N/A Necrotic Tissue: Eschar N/A N/A Exposed Structures: Fascia: No N/A N/A Fat Layer (Subcutaneous Tissue) Exposed: No Tendon: No Muscle: No Joint: No Bone: No Epithelialization: None N/A N/A Periwound Skin Texture: Excoriation: No N/A N/A Induration: No Callus: No Crepitus: No Rash: No Scarring: No Periwound Skin Dry/Scaly: Yes N/A N/A Moisture: Maceration: No Periwound Skin Color: Ecchymosis: Yes N/A N/A Atrophie Blanche: No Cyanosis:  No Erythema: No Hemosiderin Staining: No Mottled: No Pallor: No Rubor: No Temperature: No Abnormality N/A N/A Tenderness on Yes N/A N/A Palpation: Wound Preparation: Ulcer Cleansing: N/A N/A Rinsed/Irrigated with Saline Topical Anesthetic Applied: Other: lidocaine 4% Treatment Notes Electronic Signature(s) Signed: 02/25/2017 1:55:29 PM By: Christin Fudge MD, FACS Entered By: Christin Fudge on 02/25/2017 19:37:90 Herro, [name] ([id number]) -------------------------------------------------------------------------------- [place] Details Patient Name: BREYANA, FOLLANSBEE. Date of Service: 02/25/2017 1:30 PM Medical Record Number: [id number] Patient Account Number: [id number] Date of Birth/Sex: 10/31/1935 (81 y.o. Female) Treating RN: Cornell Barman Primary Care Aleksandra Raben: Lynett Fish Other Clinician: Referring Kodi Guerrera: Lynett Fish Treating Loleta Frommelt/Extender: Frann Rider in Treatment: 6 Active Inactive Electronic Signature(s) Signed: 02/25/2017 5:26:13 PM By: Gretta Cool, RN, BSN, Kim RN, BSN Entered By: Gretta Cool, RN, BSN, Kim on 02/25/2017 19:62:22 Belding, [name] ([id number]) -------------------------------------------------------------------------------- Pain Assessment Details Patient Name: [name], [name]. Date of Service: 02/25/2017 1:30 PM Medical Record Number: [id number] Patient Account Number: [id number] Date of Birth/Sex: [birth year] (81 y.o. Female) Treating RN: Cornell Barman Primary Care Rawleigh Rode: Lynett Fish Other Clinician: Referring Myrlene Riera: Lynett Fish Treating Shaurya Rawdon/Extender: Frann Rider in Treatment: 6 Active Problems Location of Pain Severity and Description of Pain Patient Has Paino No Site Locations With Dressing Change: No Pain Management and Medication Current Pain Management: Electronic Signature(s) Signed: 02/25/2017 5:26:13 PM By: Gretta Cool, RN, BSN, Kim RN, BSN Entered By: Gretta Cool, RN, BSN, Kim on 02/25/2017  81:85:63 Pontiff, [name] ([id number]) -------------------------------------------------------------------------------- Patient/Caregiver Education Details Patient Name: [name] Date of Service: 02/25/2017 1:30 PM Medical Record Number: [id number] Patient Account Number: [id number] Date of Birth/Gender: Aug 25, 1935 (81 y.o. Female) Treating RN: Cornell Barman Primary Care Physician: Lynett Fish Other Clinician: Referring Physician: Lynett Fish Treating Physician/Extender: Frann Rider in Treatment: 6 Education Assessment Education Provided To: Patient Education Topics Provided Wound/Skin Impairment: Handouts: Other: treatment complete Methods: Demonstration Responses: State content correctly Electronic Signature(s) Signed: 02/25/2017 5:26:13 PM By: Gretta Cool, RN, BSN, Kim RN, BSN Entered By: Gretta Cool, RN, BSN, Kim on 02/25/2017 41:28:78 Gaw, [name] ([id number]) -------------------------------------------------------------------------------- Wound Assessment Details Patient Name: ASSYRIA, [name]. Date of Service: 02/25/2017 1:30 PM Medical Record Number: [id number] Patient Account Number: [id number] Date of Birth/Sex: 1935/06/15 (81 y.o. Female) Treating RN: Gretta Cool,  [place] Primary Care Makayli Bracken: Lynett Fish Other Clinician: Referring Masahiro Iglesia: Lynett Fish Treating Alesia Oshields/Extender: Frann Rider in Treatment: 6 Wound Status Wound Number: 1 Primary Diabetic Wound/Ulcer of the Lower Etiology: Extremity Wound Location: Right, Posterior Lower Leg Secondary Trauma, Other Wounding Event: Trauma Etiology: Date Acquired: 12/17/2016 Wound Healed - Epithelialized Weeks Of Treatment: 6 Status: Clustered Wound: No Comorbid Cataracts, Anemia, Chronic History: Obstructive Pulmonary Disease (COPD), Type II Diabetes, Received Radiation Photos Wound Measurements Length: (cm) Width: (cm) Depth: (cm) Area: (cm) Volume: (cm) 0 % Reduction in Area:  100% 0 % Reduction in Volume: 100% 0 Epithelialization: None 0 Tunneling: No 0 Undermining: No Wound Description Classification: Grade 2 Wound Margin: Distinct, outline attached Exudate Amount: Large Exudate Type: Serosanguineous Exudate Color: red, brown Foul Odor After Cleansing: No Slough/Fibrino No Wound Bed Granulation Amount: None Present (0%) Exposed Structure Necrotic Amount: Large (67-100%) Fascia Exposed: No Necrotic Quality: Eschar Fat Layer (Subcutaneous Tissue) Exposed: No Tendon Exposed: No Brummond, Bridgette O. ([id number]) Muscle Exposed: No Joint Exposed: No Bone Exposed: No Periwound Skin Texture Texture Color No Abnormalities Noted: No No Abnormalities Noted: No Callus: No Atrophie Blanche: No Crepitus: No Cyanosis: No Excoriation: No Ecchymosis: Yes Induration: No Erythema: No Rash: No Hemosiderin Staining: No Scarring: No Mottled: No Pallor: No Moisture Rubor: No No Abnormalities Noted: No Dry / Scaly: Yes Temperature / Pain Maceration: No Temperature: No Abnormality Tenderness on Palpation: Yes Wound Preparation Ulcer Cleansing: Rinsed/Irrigated with Saline Topical Anesthetic Applied: Other: lidocaine 4%, Electronic Signature(s) Signed: 02/25/2017 5:26:13 PM By: Gretta Cool, RN, BSN, Kim RN, BSN Entered By: Gretta Cool, RN, BSN, Kim on 02/25/2017 38:25:05 Heupel, [name] ([id number]) -------------------------------------------------------------------------------- [place] Details Patient Name: [name] Date of Service: 02/25/2017 1:30 PM Medical Record Number: [id number] Patient Account Number: [id number] Date of Birth/Sex: 1935/11/30 (81 y.o. Female) Treating RN: Cornell Barman Primary Care Kynadi Dragos: Lynett Fish Other Clinician: Referring Issa Kosmicki: Lynett Fish Treating Sharine Cadle/Extender: Frann Rider in Treatment: 6 Vital Signs Time Taken: 13:28 Temperature (F): 97.8 Height (in): 64 Pulse (bpm): 95 Weight (lbs):  142.7 Respiratory Rate (breaths/min): 16 Body Mass Index (BMI): 24.5 Blood Pressure (mmHg): 150/59 Reference Range: 80 - 120 mg / dl Electronic Signature(s) Signed: 02/25/2017 5:26:13 PM By: Gretta Cool, RN, BSN, Kim RN, BSN Entered By: Gretta Cool, RN, BSN, Kim on 02/25/2017 13:28:57

## 2017-02-26 NOTE — Progress Notes (Signed)
STEPANIE, [name] ([id number]) Visit Report for 02/25/2017 Chief Complaint Document Details Patient Name: [name], [name]. Date of Service: 02/25/2017 1:30 PM Medical Record Number: [id number] Patient Account Number: [id number] Date of Birth/Sex: [birth year] (81 y.o. Female) Treating RN: Cornell Barman Primary Care Provider: Lynett Fish Other Clinician: Referring Provider: Lynett Fish Treating Provider/Extender: Frann Rider in Treatment: 6 Information Obtained from: Patient Chief Complaint Patients presents for treatment of an open diabetic ulcer to the right lower extremity which is had for about a month Electronic Signature(s) Signed: 02/25/2017 1:55:34 PM By: Christin Fudge MD, FACS Entered By: Christin Fudge on 02/25/2017 93:79:02 [name], [name] ([id number]) -------------------------------------------------------------------------------- HPI Details Patient Name: [name]. Date of Service: 02/25/2017 1:30 PM Medical Record Number: [id number] Patient Account Number: [id number] Date of Birth/Sex: 1935-05-16 (81 y.o. Female) Treating RN: Cornell Barman Primary Care Provider: Lynett Fish Other Clinician: Referring Provider: Lynett Fish Treating Provider/Extender: Frann Rider in Treatment: 6 History of Present Illness Location: right lower posterior calf Quality: Patient reports experiencing a sharp pain to affected area(s). Severity: Patient states wound are getting worse. Duration: Patient has had the wound for < 4 weeks prior to presenting for treatment Timing: Pain in wound is Intermittent (comes and goes Context: The wound occurred when the patient hit the back of her leg against a blunt object at home which opened out a wound Modifying Factors: Other treatment(s) tried include:Keflex and doxycycline by mouth Associated Signs and Symptoms: Patient reports having increase swelling. HPI Description: 82 year old patient who is known to have diabetes  mellitus and tobacco abuse has had a injury to the right posterior calf about a month ago. He has been treated with local care and 2 courses of antibiotics which include Keflex and doxycycline which she has completed. Smokes about a pack and a half of cigarettes a day and has a past medical history of COPD, diabetes mellitus, hyperlipidemia,psoriasis and hypothyroidism. 01/21/2017 -- she is still working on giving up smoking but is not inclined to do so. 01/28/2017 -- the patient is rather noncompliant and has little desire to follow instructions. 02/04/2017 -- since my talk with her last week she has begun wearing her compression stockings. Electronic Signature(s) Signed: 02/25/2017 1:55:38 PM By: Christin Fudge MD, FACS Entered By: Christin Fudge on 02/25/2017 96:22:29 [name], [name] ([id number]) -------------------------------------------------------------------------------- Physical Exam Details Patient Name: [name]. Date of Service: 02/25/2017 1:30 PM Medical Record Number: [id number] Patient Account Number: [id number] Date of Birth/Sex: 11/02/1935 (81 y.o. Female) Treating RN: Cornell Barman Primary Care Provider: Lynett Fish Other Clinician: Referring Provider: Lynett Fish Treating Provider/Extender: Frann Rider in Treatment: 6 Constitutional . Pulse regular. Respirations normal and unlabored. Afebrile. . Eyes Nonicteric. Reactive to light. Ears, Nose, Mouth, and Throat Lips, teeth, and gums WNL.Marland Kitchen Moist mucosa without lesions. Neck supple and nontender. No palpable supraclavicular or cervical adenopathy. Normal sized without goiter. Respiratory WNL. No retractions.. Breath sounds WNL, No rubs, rales, rhonchi, or wheeze.. Cardiovascular Heart rhythm and rate regular, no murmur or gallop.. Pedal Pulses WNL. No clubbing, cyanosis or edema. Chest Breasts symmetical and no nipple discharge.. Breast tissue WNL, no masses, lumps, or tenderness.. Lymphatic No  adneopathy. No adenopathy. No adenopathy. Musculoskeletal Adexa without tenderness or enlargement.. Digits and nails w/o clubbing, cyanosis, infection, petechiae, ischemia, or inflammatory conditions.. Integumentary (Hair, Skin) No suspicious lesions. No crepitus or fluctuance. No peri-wound warmth or erythema. No masses.Marland Kitchen Psychiatric Judgement and insight Intact.. No evidence of depression, anxiety, or agitation.Marland Kitchen  Notes the wound has healed and she has a supple scar Electronic Signature(s) Signed: 02/25/2017 1:55:53 PM By: Christin Fudge MD, FACS Entered By: Christin Fudge on 02/25/2017 53:97:67 [name], [name] ([id number]) -------------------------------------------------------------------------------- Physician Orders Details Patient Name: [name]. Date of Service: 02/25/2017 1:30 PM Medical Record Number: [id number] Patient Account Number: [id number] Date of Birth/Sex: 12/02/1935 (81 y.o. Female) Treating RN: Cornell Barman Primary Care Provider: Lynett Fish Other Clinician: Referring Provider: Lynett Fish Treating Provider/Extender: Frann Rider in Treatment: 6 Verbal / Phone Orders: No Diagnosis Coding Discharge From [facility] Wound #1 Right,Posterior Lower Leg o Discharge from [place] - treatment complete Electronic Signature(s) Signed: 02/25/2017 4:33:40 PM By: Christin Fudge MD, FACS Signed: 02/25/2017 5:26:13 PM By: Gretta Cool RN, BSN, Kim RN, BSN Entered By: Gretta Cool, RN, BSN, Kim on 02/25/2017 92:42:68 [name] ([id number]) -------------------------------------------------------------------------------- Problem List Details Patient Name: [name], [name]. Date of Service: 02/25/2017 1:30 PM Medical Record Number: [id number] Patient Account Number: [id number] Date of Birth/Sex: 11/08/1935 (81 y.o. 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 Date of Service: 02/25/2017 1:30 PM Medical Record Number: [id number] Patient Account Number: [id number] Date of Birth/Sex: [birth year] (81 y.o. Female) Treating RN: Cornell Barman Primary Care Provider: Lynett Fish Other Clinician: Referring Provider: Lynett Fish Treating Provider/Extender: Frann Rider in Treatment: 6 Subjective Chief Complaint Information obtained from Patient Patients presents for treatment of an open diabetic ulcer to the right lower extremity which is had for about a month History of Present Illness (HPI) The following HPI elements were documented for the patient's wound: Location: right lower posterior calf Quality: Patient reports experiencing a sharp pain to affected area(s). Severity: Patient states wound are getting worse. Duration: Patient has had the wound for < 4 weeks prior to presenting for treatment Timing: Pain in wound is Intermittent (comes and goes Context: The wound occurred when the patient hit the back of her leg against a blunt object at home which opened out a wound Modifying Factors: Other treatment(s) tried include:Keflex and doxycycline by mouth Associated Signs and  Symptoms: Patient reports having increase swelling. 82 year old patient who is known to have diabetes mellitus and tobacco abuse has had a injury to the right posterior calf about a month ago. He has been treated with local care and 2 courses of antibiotics which include Keflex and doxycycline which she has completed. Smokes about a pack and a half of cigarettes a day and has a past medical history of COPD, diabetes mellitus, hyperlipidemia,psoriasis and hypothyroidism. 01/21/2017 -- she is still working on giving up smoking but is not inclined to do so. 01/28/2017 -- the patient is rather noncompliant and has little desire to follow instructions. 02/04/2017 -- since my talk with her last week she has begun wearing her compression stockings. Objective Constitutional Pulse regular. Respirations normal and unlabored. Afebrile. Vitals Time Taken: 1:28 PM,  Height: 64 in, Weight: 142.7 lbs, BMI: 24.5, Temperature: 97.8 F, Pulse: 95 bpm, Respiratory Rate: 16 breaths/min, Blood Pressure: 150/59 mmHg. [name], [name] ([id number]) Eyes Nonicteric. Reactive to light. Ears, Nose, Mouth, and Throat Lips, teeth, and gums WNL.Marland Kitchen Moist mucosa without lesions. Neck supple and nontender. No palpable supraclavicular or cervical adenopathy. Normal sized without goiter. Respiratory WNL. No retractions.. Breath sounds WNL, No rubs, rales, rhonchi, or wheeze.. Cardiovascular Heart rhythm and rate regular, no murmur or gallop.. Pedal Pulses WNL. No clubbing, cyanosis or edema. Chest Breasts symmetical and no nipple discharge.. Breast tissue WNL, no masses, lumps, or tenderness.. Lymphatic No adneopathy. No adenopathy. No adenopathy. Musculoskeletal Adexa without tenderness or enlargement.. Digits and nails w/o clubbing, cyanosis, infection, petechiae, ischemia, or inflammatory conditions.Marland Kitchen Psychiatric Judgement and insight Intact.. No evidence of depression, anxiety, or agitation.. General Notes: the  wound has healed and she has a supple scar Integumentary (Hair, Skin) No suspicious lesions. No crepitus or fluctuance. No peri-wound warmth or erythema. No masses.. Wound #1 status is Healed - Epithelialized. Original cause of wound was Trauma. The wound is located on the Right,Posterior Lower Leg. The wound measures 0cm length x 0cm width x 0cm depth; 0cm^2 area and 0cm^3 volume. There is no tunneling or undermining noted. There is a large amount of serosanguineous drainage noted. The wound margin is distinct with the outline attached to the wound base. There is no granulation within the wound bed. There is a large (67-100%) amount of necrotic tissue within the wound bed including Eschar. The periwound skin appearance exhibited: Dry/Scaly, Ecchymosis. The periwound skin appearance did not exhibit: Callus, Crepitus, Excoriation, Induration, Rash, Scarring, Maceration, Atrophie Blanche, Cyanosis, Hemosiderin Staining, Mottled, Pallor, Rubor, Erythema. Periwound temperature was noted as No Abnormality. The periwound has tenderness on palpation. 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 She has also been urged to see a dermatologist for several other skin issues. She will be discharged on the wound care services and be seen back only if needed Electronic Signature(s) Signed: 02/25/2017 1:56:30 PM By: Christin Fudge MD, FACS Entered By: Christin Fudge on 02/25/2017  78:29:56 [name], [name] [name] ([id number]) -------------------------------------------------------------------------------- SuperBill Details Patient Name: [place], [name]. Date of Service: 02/25/2017 Medical Record Number: [id number] Patient Account Number: [id number] Date of Birth/Sex: [birth year] (82 y.o. Female) Treating RN: Cornell Barman Primary Care Provider: Lynett Fish Other Clinician: Referring Provider: Lynett Fish Treating Provider/Extender: Christin Fudge Service Line: Outpatient Weeks in Treatment: 6 Diagnosis Coding ICD-10 Codes Code Description E11.622 Type 2 diabetes mellitus with other skin ulcer L97.212 Non-pressure chronic ulcer of right calf with fat layer exposed I89.0 Lymphedema, not elsewhere classified F17.218 Nicotine dependence, cigarettes, with other nicotine-induced disorders Facility Procedures CPT4 Code: 41324401 Description: [phone] - WOUND CARE VISIT-LEV 2 EST PT Modifier: Quantity: 1 Physician Procedures CPT4 Code: [id number] Description: 47425 - WC PHYS LEVEL 2 - EST PT ICD-10 Description Diagnosis E11.622 Type 2 diabetes mellitus with other skin ulcer L97.212 Non-pressure chronic ulcer of right calf with fat I89.0 Lymphedema, not elsewhere classified Modifier: layer exposed  Quantity: 1 Electronic Signature(s) Signed: 02/25/2017 1:56:51 PM By: Christin Fudge MD, FACS Entered By: Christin Fudge on 02/25/2017 13:56:51

## 2017-03-18 DIAGNOSIS — E034 Atrophy of thyroid (acquired): Secondary | ICD-10-CM | POA: Insufficient documentation

## 2017-06-21 ENCOUNTER — Encounter: Payer: Self-pay | Admitting: *Deleted

## 2017-06-25 NOTE — Discharge Instructions (Signed)

## 2017-06-28 ENCOUNTER — Ambulatory Visit: Payer: Medicare Other | Admitting: Anesthesiology

## 2017-06-28 ENCOUNTER — Ambulatory Visit
Admission: RE | Admit: 2017-06-28 | Discharge: 2017-06-28 | Disposition: A | Discharge status: Home / Self Care | Payer: Medicare Other | Source: Ambulatory Visit | Attending: Ophthalmology | Admitting: Ophthalmology

## 2017-06-28 ENCOUNTER — Encounter
Admission: RE | Disposition: A | Discharge status: Home / Self Care | Payer: Self-pay | Source: Ambulatory Visit | Attending: Ophthalmology

## 2017-06-28 DIAGNOSIS — H2512 Age-related nuclear cataract, left eye: Secondary | ICD-10-CM | POA: Diagnosis present

## 2017-06-28 DIAGNOSIS — E119 Type 2 diabetes mellitus without complications: Secondary | ICD-10-CM | POA: Diagnosis not present

## 2017-06-28 DIAGNOSIS — Z7984 Long term (current) use of oral hypoglycemic drugs: Secondary | ICD-10-CM | POA: Insufficient documentation

## 2017-06-28 DIAGNOSIS — Z79899 Other long term (current) drug therapy: Secondary | ICD-10-CM | POA: Insufficient documentation

## 2017-06-28 DIAGNOSIS — F419 Anxiety disorder, unspecified: Secondary | ICD-10-CM | POA: Diagnosis not present

## 2017-06-28 DIAGNOSIS — Z882 Allergy status to sulfonamides status: Secondary | ICD-10-CM | POA: Diagnosis not present

## 2017-06-28 DIAGNOSIS — K589 Irritable bowel syndrome without diarrhea: Secondary | ICD-10-CM | POA: Diagnosis not present

## 2017-06-28 DIAGNOSIS — K219 Gastro-esophageal reflux disease without esophagitis: Secondary | ICD-10-CM | POA: Insufficient documentation

## 2017-06-28 DIAGNOSIS — M810 Age-related osteoporosis without current pathological fracture: Secondary | ICD-10-CM | POA: Diagnosis not present

## 2017-06-28 DIAGNOSIS — J449 Chronic obstructive pulmonary disease, unspecified: Secondary | ICD-10-CM | POA: Diagnosis not present

## 2017-06-28 DIAGNOSIS — E039 Hypothyroidism, unspecified: Secondary | ICD-10-CM | POA: Diagnosis not present

## 2017-06-28 DIAGNOSIS — F17200 Nicotine dependence, unspecified, uncomplicated: Secondary | ICD-10-CM | POA: Diagnosis not present

## 2017-06-28 HISTORY — DX: Presence of dental prosthetic device (complete) (partial): Z97.2

## 2017-06-28 HISTORY — DX: Dizziness and giddiness: R42

## 2017-06-28 HISTORY — PX: CATARACT EXTRACTION W/PHACO: SHX586

## 2017-06-28 LAB — GLUCOSE, CAPILLARY
GLUCOSE-CAPILLARY: 173 mg/dL — AB (ref 65–99)
Glucose-Capillary: 209 mg/dL — ABNORMAL HIGH (ref 65–99)

## 2017-06-28 SURGERY — PHACOEMULSIFICATION, CATARACT, WITH IOL INSERTION
Anesthesia: Monitor Anesthesia Care | Laterality: Left | Wound class: Clean

## 2017-06-28 MED ORDER — EPINEPHRINE PF 1 MG/ML IJ SOLN
INTRAOCULAR | Status: DC | PRN
  Administered 2017-06-28: 82 mL via OPHTHALMIC

## 2017-06-28 MED ORDER — ACETAMINOPHEN 160 MG/5ML PO SOLN: 325.0000 mg | ORAL | Status: DC | PRN

## 2017-06-28 MED ORDER — ACETAMINOPHEN 325 MG PO TABS: 325.0000 mg | ORAL_TABLET | ORAL | Status: DC | PRN

## 2017-06-28 MED ORDER — ARMC OPHTHALMIC DILATING DROPS
1.0000 "application " | OPHTHALMIC | Status: DC | PRN
  Administered 2017-06-28 (×2): 1 via OPHTHALMIC

## 2017-06-28 MED ORDER — LACTATED RINGERS IV SOLN: INTRAVENOUS | Status: DC

## 2017-06-28 MED ORDER — SODIUM HYALURONATE 10 MG/ML IO SOLN
INTRAOCULAR | Status: DC | PRN
  Administered 2017-06-28: 0.55 mL via INTRAOCULAR

## 2017-06-28 MED ORDER — LIDOCAINE HCL (PF) 4 % IJ SOLN
INTRAOCULAR | Status: DC | PRN
  Administered 2017-06-28: 1 mL via OPHTHALMIC

## 2017-06-28 MED ORDER — MOXIFLOXACIN HCL 0.5 % OP SOLN
OPHTHALMIC | Status: DC | PRN
  Administered 2017-06-28: 0.2 mL via OPHTHALMIC

## 2017-06-28 MED ORDER — FENTANYL CITRATE (PF) 100 MCG/2ML IJ SOLN
INTRAMUSCULAR | Status: DC | PRN
  Administered 2017-06-28: 50 ug via INTRAVENOUS

## 2017-06-28 MED ORDER — SODIUM HYALURONATE 23 MG/ML IO SOLN
INTRAOCULAR | Status: DC | PRN
  Administered 2017-06-28: .5 mL via INTRAOCULAR

## 2017-06-28 MED ORDER — MIDAZOLAM HCL 2 MG/2ML IJ SOLN
INTRAMUSCULAR | Status: DC | PRN
  Administered 2017-06-28: 2 mg via INTRAVENOUS

## 2017-06-28 SURGICAL SUPPLY — 16 items
CANNULA ANT/CHMB 27GA (MISCELLANEOUS) ×3 IMPLANT
DISSECTOR HYDRO NUCLEUS 50X22 (MISCELLANEOUS) ×3 IMPLANT
GLOVE BIO SURGEON STRL SZ8 (GLOVE) ×3 IMPLANT
GLOVE SURG LX 7.5 STRW (GLOVE) ×2
GLOVE SURG LX STRL 7.5 STRW (GLOVE) ×1 IMPLANT
GOWN STRL REUS W/ TWL LRG LVL3 (GOWN DISPOSABLE) ×2 IMPLANT
GOWN STRL REUS W/TWL LRG LVL3 (GOWN DISPOSABLE) ×4
LENS IOL TECNIS ITEC 20.0 (Intraocular Lens) ×3 IMPLANT
MARKER SKIN DUAL TIP RULER LAB (MISCELLANEOUS) ×3 IMPLANT
PACK CATARACT (MISCELLANEOUS) ×3 IMPLANT
PACK DR. KING ARMS (PACKS) ×3 IMPLANT
PACK EYE AFTER SURG (MISCELLANEOUS) ×3 IMPLANT
SYR 3ML LL SCALE MARK (SYRINGE) ×3 IMPLANT
SYR TB 1ML LUER SLIP (SYRINGE) ×3 IMPLANT
WATER STERILE IRR 500ML POUR (IV SOLUTION) ×3 IMPLANT
WIPE NON LINTING 3.25X3.25 (MISCELLANEOUS) ×3 IMPLANT

## 2017-06-28 NOTE — Transfer of Care (Signed)
Immediate Anesthesia Transfer of Care Note  Patient: [name]  Procedure(s) Performed: Procedure(s) with comments: CATARACT EXTRACTION PHACO AND INTRAOCULAR LENS PLACEMENT (IOC)  Left Diabetic (Left) - Diabetic - oral meds  Patient Location: PACU  Anesthesia Type: MAC  Level of Consciousness: awake, alert  and patient cooperative  Airway and Oxygen Therapy: Patient Spontanous Breathing and Patient connected to supplemental oxygen  Post-op Assessment: Post-op Vital signs reviewed, Patient's Cardiovascular Status Stable, Respiratory Function Stable, Patent Airway and No signs of Nausea or vomiting  Post-op Vital Signs: Reviewed and stable  Complications: No apparent anesthesia complications

## 2017-06-28 NOTE — Anesthesia Preprocedure Evaluation (Signed)
Anesthesia Evaluation  Patient identified by MRN, date of birth, ID band Patient awake    Reviewed: Allergy & Precautions, H&P , NPO status , Patient's Chart, lab work & pertinent test results  Airway Mallampati: II  TM Distance: >3 FB Neck ROM: full    Dental no notable dental hx.    Pulmonary COPD, Current Smoker,    Pulmonary exam normal + rhonchi  + wheezing      Cardiovascular Normal cardiovascular exam Rhythm:regular Rate:Normal     Neuro/Psych    GI/Hepatic   Endo/Other  diabetesHypothyroidism   Renal/GU      Musculoskeletal   Abdominal   Peds  Hematology   Anesthesia Other Findings   Reproductive/Obstetrics                             Anesthesia Physical Anesthesia Plan  ASA: III  Anesthesia Plan: MAC   Post-op Pain Management:    Induction:   PONV Risk Score and Plan: 2 and Propofol  Airway Management Planned:   Additional Equipment:   Intra-op Plan:   Post-operative Plan:   Informed Consent: I have reviewed the patients History and Physical, chart, labs and discussed the procedure including the risks, benefits and alternatives for the proposed anesthesia with the patient or authorized representative who has indicated his/her understanding and acceptance.     Plan Discussed with: CRNA  Anesthesia Plan Comments:         Anesthesia Quick Evaluation

## 2017-06-28 NOTE — Op Note (Signed)
OPERATIVE NOTE  [name] [id number] 06/28/2017   PREOPERATIVE DIAGNOSIS:  Nuclear sclerotic cataract left eye.  H25.12   POSTOPERATIVE DIAGNOSIS:    Nuclear sclerotic cataract left eye.     PROCEDURE:  Phacoemusification with posterior chamber intraocular lens placement of the left eye   LENS:   Implant Name Type Inv. Item Serial No. Manufacturer Lot No. LRB No. Used  LENS IOL DIOP 20.0 - [id number] Intraocular Lens LENS IOL DIOP 20.0 [id number] AMO   Left 1       PCB00 +20.0   ULTRASOUND TIME: 0 minutes 30.6 seconds.  CDE 5.53   SURGEON:  [name], MD, MPH   ANESTHESIA:  Topical with tetracaine drops augmented with 1% preservative-free intracameral lidocaine.  ESTIMATED BLOOD LOSS: <1 mL   COMPLICATIONS:  None.   DESCRIPTION OF PROCEDURE:  The patient was identified in the holding room and transported to the operating room and placed in the supine position under the operating microscope.  The left eye was identified as the operative eye and it was prepped and draped in the usual sterile ophthalmic fashion.   A 1.0 millimeter clear-corneal paracentesis was made at the 5:00 position. 0.5 ml of preservative-free 1% lidocaine with epinephrine was injected into the anterior chamber.  The anterior chamber was filled with Healon 5 viscoelastic.  A 2.4 millimeter keratome was used to make a near-clear corneal incision at the 2:00 position.  A curvilinear capsulorrhexis was made with a cystotome and capsulorrhexis forceps.  Balanced salt solution was used to hydrodissect and hydrodelineate the nucleus.   Phacoemulsification was then used in stop and chop fashion to remove the lens nucleus and epinucleus.  The remaining cortex was then removed using the irrigation and aspiration handpiece. Healon was then placed into the capsular bag to distend it for lens placement.  A lens was then injected into the capsular bag.  The remaining viscoelastic was aspirated.   Wounds were hydrated  with balanced salt solution.  The anterior chamber was inflated to a physiologic pressure with balanced salt solution.  Intracameral vigamox 0.1 mL undiltued was injected into the eye and a drop placed onto the ocular surface. No wound leaks were noted.  The patient was taken to the recovery room in stable condition without complications of anesthesia or surgery   [name] 06/28/2017, 8:57 AM

## 2017-06-28 NOTE — Anesthesia Postprocedure Evaluation (Signed)
Anesthesia Post Note  Patient: [name]  Procedure(s) Performed: Procedure(s) (LRB): CATARACT EXTRACTION PHACO AND INTRAOCULAR LENS PLACEMENT (IOC)  Left Diabetic (Left)  Patient location during evaluation: PACU Anesthesia Type: MAC Level of consciousness: awake and alert and oriented Pain management: satisfactory to patient Vital Signs Assessment: post-procedure vital signs reviewed and stable Respiratory status: spontaneous breathing, nonlabored ventilation and respiratory function stable Cardiovascular status: blood pressure returned to baseline and stable Postop Assessment: Adequate PO intake and No signs of nausea or vomiting Anesthetic complications: no    Raliegh Ip

## 2017-06-28 NOTE — H&P (Signed)
The History and Physical notes are on paper, have been signed, and are to be scanned.   I have examined the patient and there are no changes to the H&P.   [name] 06/28/2017 8:17 AM

## 2017-06-28 NOTE — Anesthesia Procedure Notes (Signed)
Procedure Name: MAC Performed by: Evanell Redlich Pre-anesthesia Checklist: Patient identified, Emergency Drugs available, Suction available, Timeout performed and Patient being monitored Patient Re-evaluated:Patient Re-evaluated prior to inductionOxygen Delivery Method: Nasal cannula Placement Confirmation: positive ETCO2     

## 2017-06-29 ENCOUNTER — Encounter: Payer: Self-pay | Admitting: Ophthalmology

## 2017-07-26 ENCOUNTER — Encounter: Payer: Self-pay | Admitting: *Deleted

## 2017-07-30 NOTE — Discharge Instructions (Signed)

## 2017-08-02 ENCOUNTER — Ambulatory Visit: Payer: Medicare Other | Admitting: Anesthesiology

## 2017-08-02 ENCOUNTER — Ambulatory Visit
Admission: RE | Admit: 2017-08-02 | Discharge: 2017-08-02 | Disposition: A | Discharge status: Home / Self Care | Payer: Medicare Other | Source: Ambulatory Visit | Attending: Ophthalmology | Admitting: Ophthalmology

## 2017-08-02 ENCOUNTER — Encounter
Admission: RE | Disposition: A | Discharge status: Home / Self Care | Payer: Self-pay | Source: Ambulatory Visit | Attending: Ophthalmology

## 2017-08-02 DIAGNOSIS — Z853 Personal history of malignant neoplasm of breast: Secondary | ICD-10-CM | POA: Insufficient documentation

## 2017-08-02 DIAGNOSIS — H2511 Age-related nuclear cataract, right eye: Secondary | ICD-10-CM | POA: Insufficient documentation

## 2017-08-02 DIAGNOSIS — Z882 Allergy status to sulfonamides status: Secondary | ICD-10-CM | POA: Diagnosis not present

## 2017-08-02 DIAGNOSIS — D649 Anemia, unspecified: Secondary | ICD-10-CM | POA: Diagnosis not present

## 2017-08-02 DIAGNOSIS — E079 Disorder of thyroid, unspecified: Secondary | ICD-10-CM | POA: Diagnosis not present

## 2017-08-02 DIAGNOSIS — F329 Major depressive disorder, single episode, unspecified: Secondary | ICD-10-CM | POA: Diagnosis not present

## 2017-08-02 DIAGNOSIS — Z9049 Acquired absence of other specified parts of digestive tract: Secondary | ICD-10-CM | POA: Insufficient documentation

## 2017-08-02 DIAGNOSIS — K589 Irritable bowel syndrome without diarrhea: Secondary | ICD-10-CM | POA: Insufficient documentation

## 2017-08-02 DIAGNOSIS — E1136 Type 2 diabetes mellitus with diabetic cataract: Secondary | ICD-10-CM | POA: Insufficient documentation

## 2017-08-02 DIAGNOSIS — L409 Psoriasis, unspecified: Secondary | ICD-10-CM | POA: Diagnosis not present

## 2017-08-02 DIAGNOSIS — M810 Age-related osteoporosis without current pathological fracture: Secondary | ICD-10-CM | POA: Diagnosis not present

## 2017-08-02 DIAGNOSIS — R05 Cough: Secondary | ICD-10-CM | POA: Insufficient documentation

## 2017-08-02 DIAGNOSIS — M7989 Other specified soft tissue disorders: Secondary | ICD-10-CM | POA: Insufficient documentation

## 2017-08-02 DIAGNOSIS — F17200 Nicotine dependence, unspecified, uncomplicated: Secondary | ICD-10-CM | POA: Diagnosis not present

## 2017-08-02 DIAGNOSIS — M1990 Unspecified osteoarthritis, unspecified site: Secondary | ICD-10-CM | POA: Insufficient documentation

## 2017-08-02 HISTORY — PX: CATARACT EXTRACTION W/PHACO: SHX586

## 2017-08-02 LAB — GLUCOSE, CAPILLARY
GLUCOSE-CAPILLARY: 176 mg/dL — AB (ref 65–99)
Glucose-Capillary: 152 mg/dL — ABNORMAL HIGH (ref 65–99)

## 2017-08-02 SURGERY — PHACOEMULSIFICATION, CATARACT, WITH IOL INSERTION
Anesthesia: Monitor Anesthesia Care | Site: Eye | Laterality: Right | Wound class: Clean

## 2017-08-02 MED ORDER — MOXIFLOXACIN HCL 0.5 % OP SOLN
OPHTHALMIC | Status: DC | PRN
  Administered 2017-08-02: 0.2 mL via OPHTHALMIC

## 2017-08-02 MED ORDER — ONDANSETRON HCL 4 MG/2ML IJ SOLN: 4.0000 mg | Freq: Once | INTRAMUSCULAR | Status: DC | PRN

## 2017-08-02 MED ORDER — LACTATED RINGERS IV SOLN: INTRAVENOUS | Status: DC

## 2017-08-02 MED ORDER — SODIUM HYALURONATE 23 MG/ML IO SOLN
INTRAOCULAR | Status: DC | PRN
  Administered 2017-08-02: 0.6 mL via INTRAOCULAR

## 2017-08-02 MED ORDER — LIDOCAINE HCL (PF) 2 % IJ SOLN
INTRAMUSCULAR | Status: DC | PRN
  Administered 2017-08-02: .5 mL

## 2017-08-02 MED ORDER — MIDAZOLAM HCL 2 MG/2ML IJ SOLN
INTRAMUSCULAR | Status: DC | PRN
  Administered 2017-08-02 (×2): 1 mg via INTRAVENOUS

## 2017-08-02 MED ORDER — FENTANYL CITRATE (PF) 100 MCG/2ML IJ SOLN
INTRAMUSCULAR | Status: DC | PRN
Start: 2017-08-02 — End: 2017-08-02
  Administered 2017-08-02 (×2): 50 ug via INTRAVENOUS

## 2017-08-02 MED ORDER — SODIUM HYALURONATE 10 MG/ML IO SOLN
INTRAOCULAR | Status: DC | PRN
  Administered 2017-08-02: 0.55 mL via INTRAOCULAR

## 2017-08-02 MED ORDER — ARMC OPHTHALMIC DILATING DROPS
1.0000 "application " | OPHTHALMIC | Status: DC | PRN
  Administered 2017-08-02 (×3): 1 via OPHTHALMIC

## 2017-08-02 MED ORDER — EPINEPHRINE PF 1 MG/ML IJ SOLN
INTRAOCULAR | Status: DC | PRN
  Administered 2017-08-02: 97 mL via OPHTHALMIC

## 2017-08-02 SURGICAL SUPPLY — 16 items
CANNULA ANT/CHMB 27GA (MISCELLANEOUS) ×3 IMPLANT
DISSECTOR HYDRO NUCLEUS 50X22 (MISCELLANEOUS) ×3 IMPLANT
GLOVE BIO SURGEON STRL SZ8 (GLOVE) ×3 IMPLANT
GLOVE SURG LX 7.5 STRW (GLOVE) ×2
GLOVE SURG LX STRL 7.5 STRW (GLOVE) ×1 IMPLANT
GOWN STRL REUS W/ TWL LRG LVL3 (GOWN DISPOSABLE) ×2 IMPLANT
GOWN STRL REUS W/TWL LRG LVL3 (GOWN DISPOSABLE) ×4
LENS IOL TECNIS ITEC 20.5 (Intraocular Lens) ×3 IMPLANT
MARKER SKIN DUAL TIP RULER LAB (MISCELLANEOUS) ×3 IMPLANT
PACK CATARACT (MISCELLANEOUS) ×3 IMPLANT
PACK DR. KING ARMS (PACKS) ×3 IMPLANT
PACK EYE AFTER SURG (MISCELLANEOUS) ×3 IMPLANT
SYR 3ML LL SCALE MARK (SYRINGE) ×3 IMPLANT
SYR TB 1ML LUER SLIP (SYRINGE) ×3 IMPLANT
WATER STERILE IRR 500ML POUR (IV SOLUTION) ×3 IMPLANT
WIPE NON LINTING 3.25X3.25 (MISCELLANEOUS) ×3 IMPLANT

## 2017-08-02 NOTE — Anesthesia Procedure Notes (Signed)
Procedure Name: MAC Date/Time: 08/02/2017 10:22 AM Performed by: Cameron Ali Pre-anesthesia Checklist: Patient identified, Emergency Drugs available, Suction available, Timeout performed and Patient being monitored Patient Re-evaluated:Patient Re-evaluated prior to induction Oxygen Delivery Method: Nasal cannula Placement Confirmation: positive ETCO2

## 2017-08-02 NOTE — Op Note (Signed)
OPERATIVE NOTE  [name] [id number] 08/02/2017   PREOPERATIVE DIAGNOSIS:  Nuclear sclerotic cataract right eye.  H25.11   POSTOPERATIVE DIAGNOSIS:    Nuclear sclerotic cataract right eye.     PROCEDURE:  Phacoemusification with posterior chamber intraocular lens placement of the right eye   LENS:   Implant Name Type Inv. Item Serial No. Manufacturer Lot No. LRB No. Used  LENS IOL DIOP 20.5 - [id number] Intraocular Lens LENS IOL DIOP 20.5 [id number] AMO   Right 1       PCB00 +20.5   ULTRASOUND TIME: 0 minutes 46.9 seconds.  CDE 6.99   SURGEON:  [name], MD, MPH  ANESTHESIOLOGIST: Anesthesiologist: Darrin Nipper, MD CRNA: Cameron Ali, CRNA   ANESTHESIA:  Topical with tetracaine drops augmented with 1% preservative-free intracameral lidocaine.  ESTIMATED BLOOD LOSS: less than 1 mL.   COMPLICATIONS:  None.   DESCRIPTION OF PROCEDURE:  The patient was identified in the holding room and transported to the operating room and placed in the supine position under the operating microscope.  The right eye was identified as the operative eye and it was prepped and draped in the usual sterile ophthalmic fashion.   A 1.0 millimeter clear-corneal paracentesis was made at the 10:30 position. 0.5 ml of preservative-free 1% lidocaine with epinephrine was injected into the anterior chamber.  The anterior chamber was filled with Healon 5 viscoelastic.  A 2.4 millimeter keratome was used to make a near-clear corneal incision at the 8:00 position.  A curvilinear capsulorrhexis was made with a cystotome and capsulorrhexis forceps.    There was some challenge in creating a continuous round rhexis and there was an anterior capsular tear at 11:00 which radialized early in the surgery but did not wrap around to the posterior capsule.  Since the extent of the tear was only a couple of clock hours, this did not interfere with the ability to use a single piece acrylic lens in the bag.  Balanced  salt solution was used to hydrodissect and hydrodelineate the nucleus.   Phacoemulsification was then used in stop and chop fashion to remove the lens nucleus and epinucleus.  The remaining cortex was then removed using the irrigation and aspiration handpiece. Healon was then placed into the capsular bag to distend it for lens placement.  A lens was then injected into the capsular bag.   The haptics were oriented at 3:00 and 9:00 (away from the anterior capsular tear).   The remaining viscoelastic was aspirated.   Wounds were hydrated with balanced salt solution.  The anterior chamber was inflated to a physiologic pressure with balanced salt solution.   Intracameral vigamox 0.1 mL undiluted was injected into the eye and a drop placed onto the ocular surface.  No wound leaks were noted.  The patient was taken to the recovery room in stable condition without complications of anesthesia or surgery  [name] 08/02/2017, 10:54 AM

## 2017-08-02 NOTE — Anesthesia Postprocedure Evaluation (Signed)
Anesthesia Post Note  Patient: [name]  Procedure(s) Performed: Procedure(s) (LRB): CATARACT EXTRACTION PHACO AND INTRAOCULAR LENS PLACEMENT (IOC)RIGHT DIABETIC (Right)  Patient location during evaluation: PACU Anesthesia Type: MAC Level of consciousness: awake and alert, oriented and patient cooperative Pain management: pain level controlled Vital Signs Assessment: post-procedure vital signs reviewed and stable Respiratory status: spontaneous breathing, nonlabored ventilation and respiratory function stable Cardiovascular status: blood pressure returned to baseline and stable Postop Assessment: adequate PO intake Anesthetic complications: no    Darrin Nipper

## 2017-08-02 NOTE — Transfer of Care (Signed)
Immediate Anesthesia Transfer of Care Note  Patient: [name]  Procedure(s) Performed: Procedure(s) with comments: CATARACT EXTRACTION PHACO AND INTRAOCULAR LENS PLACEMENT (IOC)RIGHT DIABETIC (Right) - DIABETIC WANTS TO BE AFTER 8  Patient Location: PACU  Anesthesia Type: MAC  Level of Consciousness: awake, alert  and patient cooperative  Airway and Oxygen Therapy: Patient Spontanous Breathing and Patient connected to supplemental oxygen  Post-op Assessment: Post-op Vital signs reviewed, Patient's Cardiovascular Status Stable, Respiratory Function Stable, Patent Airway and No signs of Nausea or vomiting  Post-op Vital Signs: Reviewed and stable  Complications: No apparent anesthesia complications

## 2017-08-02 NOTE — H&P (Signed)
The History and Physical notes are on paper, have been signed, and are to be scanned.   I have examined the patient and there are no changes to the H&P.   [name] 08/02/2017 10:07 AM

## 2017-08-02 NOTE — Anesthesia Preprocedure Evaluation (Signed)
Anesthesia Evaluation  Patient identified by MRN, date of birth, ID band Patient awake    Reviewed: Allergy & Precautions, NPO status , Patient's Chart, lab work & pertinent test results  History of Anesthesia Complications Negative for: history of anesthetic complications  Airway Mallampati: II  TM Distance: >3 FB Neck ROM: Full    Dental  (+) Partial Upper   Pulmonary COPD, Current Smoker (1 ppd),    Pulmonary exam normal breath sounds clear to auscultation       Cardiovascular Exercise Tolerance: Good Normal cardiovascular exam Rhythm:Regular Rate:Normal     Neuro/Psych negative neurological ROS     GI/Hepatic negative GI ROS,   Endo/Other  diabetes, Type 2Hypothyroidism   Renal/GU negative Renal ROS     Musculoskeletal   Abdominal   Peds  Hematology Breast CA   Anesthesia Other Findings   Reproductive/Obstetrics                             Anesthesia Physical Anesthesia Plan  ASA: II  Anesthesia Plan: MAC   Post-op Pain Management:    Induction: Intravenous  PONV Risk Score and Plan: 0  Airway Management Planned:   Additional Equipment:   Intra-op Plan:   Post-operative Plan:   Informed Consent: I have reviewed the patients History and Physical, chart, labs and discussed the procedure including the risks, benefits and alternatives for the proposed anesthesia with the patient or authorized representative who has indicated his/her understanding and acceptance.     Plan Discussed with: CRNA  Anesthesia Plan Comments:         Anesthesia Quick Evaluation

## 2017-08-03 ENCOUNTER — Encounter: Payer: Self-pay | Admitting: Ophthalmology

## 2017-09-03 ENCOUNTER — Other Ambulatory Visit: Payer: Self-pay | Admitting: Surgery

## 2017-09-03 DIAGNOSIS — Z853 Personal history of malignant neoplasm of breast: Secondary | ICD-10-CM

## 2017-10-14 ENCOUNTER — Ambulatory Visit
Admission: RE | Admit: 2017-10-14 | Discharge: 2017-10-14 | Disposition: A | Discharge status: Home / Self Care | Payer: Medicare Other | Source: Ambulatory Visit | Attending: Surgery | Admitting: Surgery

## 2017-10-14 DIAGNOSIS — Z853 Personal history of malignant neoplasm of breast: Secondary | ICD-10-CM | POA: Diagnosis present

## 2018-04-04 ENCOUNTER — Encounter: Payer: Medicare Other | Attending: Physician Assistant | Admitting: Physician Assistant

## 2018-04-04 DIAGNOSIS — E11622 Type 2 diabetes mellitus with other skin ulcer: Secondary | ICD-10-CM | POA: Insufficient documentation

## 2018-04-04 DIAGNOSIS — L98492 Non-pressure chronic ulcer of skin of other sites with fat layer exposed: Secondary | ICD-10-CM | POA: Diagnosis not present

## 2018-04-04 DIAGNOSIS — F17210 Nicotine dependence, cigarettes, uncomplicated: Secondary | ICD-10-CM | POA: Diagnosis not present

## 2018-04-04 DIAGNOSIS — E039 Hypothyroidism, unspecified: Secondary | ICD-10-CM | POA: Diagnosis not present

## 2018-04-04 DIAGNOSIS — J449 Chronic obstructive pulmonary disease, unspecified: Secondary | ICD-10-CM | POA: Insufficient documentation

## 2018-04-04 DIAGNOSIS — I890 Lymphedema, not elsewhere classified: Secondary | ICD-10-CM | POA: Insufficient documentation

## 2018-04-08 NOTE — Progress Notes (Signed)
[name], [name] ([id number]) Visit Report for 04/04/2018 Chief Complaint Document Details Patient Name: [name], [name]. Date of Service: 04/04/2018 12:30 PM Medical Record Number: [id number] Patient Account Number: [id number] Date of Birth/Sex: 09-Aug-1935 (83 y.o. F) Treating RN: Roger Shelter Primary Care Provider: Harrel Lemon Other Clinician: Referring Provider: Referral, Self Treating Provider/Extender: Melburn Hake,  Weeks in Treatment: 0 Information Obtained from: Patient Chief Complaint Right forearm skin tear Electronic Signature(s) Signed: 04/05/2018 1:14:22 PM By: Worthy Keeler PA-C Entered By: Worthy Keeler on 04/04/2018 92:42:68 [place], [name] ([id number]) -------------------------------------------------------------------------------- HPI Details Patient Name: [name] Date of Service: 04/04/2018 12:30 PM Medical Record Number: [id number] Patient Account Number: [id number] Date of Birth/Sex: Feb 09, 1935 (83 y.o. F) Treating RN: Roger Shelter Primary Care Provider: Harrel Lemon Other Clinician: Referring Provider: Referral, Self Treating Provider/Extender: Melburn Hake,  Weeks in Treatment: 0 History of Present Illness HPI Description: 83 year old patient who is known to have diabetes mellitus and tobacco abuse has had a injury to the right posterior calf about a month ago. He has been treated with local care and 2 courses of antibiotics which include Keflex and doxycycline which she has completed. Smokes about a pack and a half of cigarettes a day and has a past medical history of COPD, diabetes mellitus, hyperlipidemia,psoriasis and hypothyroidism. Readmission: 04/04/18 patient presents today for a initial evaluation today concerning a right forearm skin tear which has been present for about two weeks. Currently need this point has been applied to the wound bed followed by a protective dressing that has been secured by wrapping instead of  adhesive's. Currently the patient's wound does seem to be doing better patient and her husband states that the excess skin from the skin tear was actually trimmed away prior to starting the wound care they have been performing. With that being said she seems to be doing well there's no evidence of infection which is good news and overall the wound seems to be showing signs of good granulation and overall improvement which is great news. Fortunately she has no with valid bladder dysfunction unexpected fevers chills or weight loss. This seems to be rather straightforward in my opinion in regard to treatment. Electronic Signature(s) Signed: 04/05/2018 1:14:22 PM By: Worthy Keeler PA-C Entered By: Worthy Keeler on 04/05/2018 17:40:81 [name], [name] ([id number]) -------------------------------------------------------------------------------- Physical Exam Details Patient Name: [name], [name]. Date of Service: 04/04/2018 12:30 PM Medical Record Number: [id number] Patient Account Number: [id number] Date of Birth/Sex: [birth year] (83 y.o. F) Treating RN: Roger Shelter Primary Care Provider: Harrel Lemon Other Clinician: Referring Provider: Referral, Self Treating Provider/Extender: STONE III,  Weeks in Treatment: 0 Constitutional sitting or standing blood pressure is within target range for patient.. pulse regular and within target range for patient.Marland Kitchen respirations regular, non-labored and within target range for patient.Marland Kitchen temperature within target range for patient.. Well- nourished and well-hydrated in no acute distress. Eyes conjunctiva clear no eyelid edema noted. pupils equal round and reactive to light and accommodation. Ears, Nose, Mouth, and Throat no gross abnormality of ear auricles or external auditory canals. normal hearing noted during conversation. mucus membranes moist. Respiratory normal breathing without difficulty. clear to auscultation  bilaterally. Cardiovascular regular rate and rhythm with normal S1, S2. no clubbing, cyanosis, significant edema, <3 sec cap refill. Gastrointestinal (GI) soft, non-tender, non-distended, +BS. no ventral hernia noted. Musculoskeletal normal gait and posture. no significant deformity or arthritic changes, no loss or range of motion, no clubbing. Psychiatric this patient is able to make decisions  and demonstrates good insight into disease process. Alert and Oriented x 3. pleasant and cooperative. Notes Currently the patient's wound shows evidence of good granulation in the base of the wound and there is no depth of Cantley to the wound. In general she has been tolerating the dressing changes well complication they been using an antibiotic ointment with good result. Obviously I think the main thing this was doing was providing moisture and preventing infection but that seems to have done very well for her. No sharp debridement was performed nor needed today. Mechanically I did debride the wound utilizing saline and gauze with good result post debridement the wound appear to be very clean with no remaining Slough. Electronic Signature(s) Signed: 04/05/2018 1:14:22 PM By: Worthy Keeler PA-C Entered By: Worthy Keeler on 04/05/2018 29:93:71 [name], [name] ([id number]) -------------------------------------------------------------------------------- Physician Orders Details Patient Name: [name] Date of Service: 04/04/2018 12:30 PM Medical Record Number: [id number] Patient Account Number: [id number] Date of Birth/Sex: 1934-12-28 (83 y.o. F) Treating RN: Roger Shelter Primary Care Provider: Harrel Lemon Other Clinician: Referring Provider: Referral, Self Treating Provider/Extender: Melburn Hake,  Weeks in Treatment: 0 Verbal / Phone Orders: No Diagnosis Coding ICD-10 Coding Code Description S51.801A Unspecified open wound of right forearm, initial encounter L98.492  Non-pressure chronic ulcer of skin of other sites with fat layer exposed E11.622 Type 2 diabetes mellitus with other skin ulcer I89.0 Lymphedema, not elsewhere classified F17.218 Nicotine dependence, cigarettes, with other nicotine-induced disorders Wound Cleansing Wound #2 Right Forearm o Clean wound with Normal Saline. Anesthetic (add to Medication List) Wound #2 Right Forearm o Topical Lidocaine 4% cream applied to wound bed prior to debridement (In Clinic Only). Primary Wound Dressing Wound #2 Right Forearm o Xeroform Secondary Dressing Wound #2 Right Forearm o Non-adherent pad Dressing Change Frequency Wound #2 Right Forearm o Change dressing every day. Follow-up Appointments Wound #2 Right Forearm o Return Appointment in 1 week. Notes conform and coban Electronic Signature(s) Signed: 04/04/2018 4:40:07 PM By: Roger Shelter Signed: 04/05/2018 1:14:22 PM By: Worthy Keeler PA-C Entered By: Roger Shelter on 04/04/2018 52:77:82 [name], [name] ([id number]) [name], [name] ([id number]) -------------------------------------------------------------------------------- Problem List Details Patient Name: TRANAE, LARAMIE. Date of Service: 04/04/2018 12:30 PM Medical Record Number: [id number] Patient Account Number: [id number] Date of Birth/Sex: June 27, 1935 (83 y.o. F) Treating RN: Roger Shelter Primary Care Provider: Harrel Lemon Other Clinician: Referring Provider: Referral, Self Treating Provider/Extender: Melburn Hake,  Weeks in Treatment: 0 Active Problems ICD-10 Impacting Encounter Code Description Active Date Wound Healing Diagnosis S51.801A Unspecified open wound of right forearm, initial encounter 04/04/2018 Yes L98.492 Non-pressure chronic ulcer of skin of other sites with fat layer 04/04/2018 Yes exposed E11.622 Type 2 diabetes mellitus with other skin ulcer 04/04/2018 Yes I89.0 Lymphedema, not elsewhere classified 04/04/2018 Yes F17.218 Nicotine  dependence, cigarettes, with other nicotine-induced 04/04/2018 Yes disorders Inactive Problems Resolved Problems Electronic Signature(s) Signed: 04/05/2018 1:14:22 PM By: Worthy Keeler PA-C Entered By: Worthy Keeler on 04/04/2018 71:24:58 Jaspers, Lauria Jenetta [name] ([id number]) -------------------------------------------------------------------------------- Progress Note Details Patient Name: [name], [name]. Date of Service: 04/04/2018 12:30 PM Medical Record Number: [id number] Patient Account Number: [id number] Date of Birth/Sex: December 28, 1934 (83 y.o. F) Treating RN: Roger Shelter Primary Care Provider: Harrel Lemon Other Clinician: Referring Provider: Referral, Self Treating Provider/Extender: Melburn Hake,  Weeks in Treatment: 0 Subjective Chief Complaint Information obtained from Patient Right forearm skin tear History of Present Illness (HPI) 83 year old patient who is known to have diabetes mellitus and tobacco abuse has had  a injury to the right posterior calf about a month ago. He has been treated with local care and 2 courses of antibiotics which include Keflex and doxycycline which she has completed. Smokes about a pack and a half of cigarettes a day and has a past medical history of COPD, diabetes mellitus, hyperlipidemia,psoriasis and hypothyroidism. Readmission: 04/04/18 patient presents today for a initial evaluation today concerning a right forearm skin tear which has been present for about two weeks. Currently need this point has been applied to the wound bed followed by a protective dressing that has been secured by wrapping instead of adhesive's. Currently the patient's wound does seem to be doing better patient and her husband states that the excess skin from the skin tear was actually trimmed away prior to starting the wound care they have been performing. With that being said she seems to be doing well there's no evidence of infection which is good news and overall the  wound seems to be showing signs of good granulation and overall improvement which is great news. Fortunately she has no with valid bladder dysfunction unexpected fevers chills or weight loss. This seems to be rather straightforward in my opinion in regard to treatment. Wound History Patient presents with 1 open wound that has been present for approximately 2 weeks. Patient has been treating wound in the following manner: neosporin. Laboratory tests have not been performed in the last month. Patient reportedly has not tested positive for an antibiotic resistant organism. Patient reportedly has not tested positive for osteomyelitis. Patient reportedly has not had testing performed to evaluate circulation in the legs. Patient History Information obtained from Patient. Allergies Sulfa (Sulfonamide Antibiotics) Family History Heart Disease - Mother, No family history of Cancer, Diabetes, Hereditary Spherocytosis, Hypertension, Kidney Disease, Lung Disease, Seizures, Stroke, Thyroid Problems, Tuberculosis. Social History Current every day smoker - 1 1.5 a day, Marital Status - Married, Alcohol Use - Never, Drug Use - No History, Caffeine Use - Daily. Medical History [name], [name] ([id number]) Hematologic/Lymphatic Denies history of Hemophilia, Human Immunodeficiency Virus, Lymphedema, Sickle Cell Disease Respiratory Denies history of Aspiration, Asthma, Pneumothorax, Sleep Apnea, Tuberculosis Cardiovascular Denies history of Angina, Arrhythmia, Congestive Heart Failure, Coronary Artery Disease, Deep Vein Thrombosis, Hypertension, Hypotension, Myocardial Infarction, Peripheral Arterial Disease, Peripheral Venous Disease, Phlebitis, Vasculitis Gastrointestinal Denies history of Cirrhosis , Colitis, Crohn s, Hepatitis A, Hepatitis B Medical And Surgical History Notes Cardiovascular hyperlipidemia Review of Systems (ROS) Cardiovascular The patient has no complaints or  symptoms. Gastrointestinal The patient has no complaints or symptoms. Objective Constitutional sitting or standing blood pressure is within target range for patient.. pulse regular and within target range for patient.Marland Kitchen respirations regular, non-labored and within target range for patient.Marland Kitchen temperature within target range for patient.. Well- nourished and well-hydrated in no acute distress. Vitals Time Taken: 12:51 PM, Height: 64 in, Source: Measured, Weight: 142 lbs, Source: Measured, BMI: 24.4, Temperature: 98.4 F, Pulse: 87 bpm, Respiratory Rate: 18 breaths/min, Blood Pressure: 138/70 mmHg. Eyes conjunctiva clear no eyelid edema noted. pupils equal round and reactive to light and accommodation. Ears, Nose, Mouth, and Throat no gross abnormality of ear auricles or external auditory canals. normal hearing noted during conversation. mucus membranes moist. Respiratory normal breathing without difficulty. clear to auscultation bilaterally. Cardiovascular regular rate and rhythm with normal S1, S2. no clubbing, cyanosis, significant edema, Gastrointestinal (GI) soft, non-tender, non-distended, +BS. no ventral hernia noted. Musculoskeletal normal gait and posture. no significant deformity or arthritic changes, no loss or range of motion, no clubbing.  Psychiatric [name], [name] ([id number]) this patient is able to make decisions and demonstrates good insight into disease process. Alert and Oriented x 3. pleasant and cooperative. General Notes: Currently the patient's wound shows evidence of good granulation in the base of the wound and there is no depth of Cantley to the wound. In general she has been tolerating the dressing changes well complication they been using an antibiotic ointment with good result. Obviously I think the main thing this was doing was providing moisture and preventing infection but that seems to have done very well for her. No sharp debridement was performed nor  needed today. Mechanically I did debride the wound utilizing saline and gauze with good result post debridement the wound appear to be very clean with no remaining Slough. Integumentary (Hair, Skin) Wound #2 status is Open. Original cause of wound was Trauma. The wound is located on the Right Forearm. The wound measures 1.2cm length x 3.4cm width x 0.1cm depth; 3.204cm^2 area and 0.32cm^3 volume. There is no tunneling or undermining noted. There is a large amount of serosanguineous drainage noted. The wound margin is distinct with the outline attached to the wound base. There is large (67-100%) red granulation within the wound bed. There is a small (1-33%) amount of necrotic tissue within the wound bed including Adherent Slough. Periwound temperature was noted as No Abnormality. The periwound has tenderness on palpation. 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 Anesthetic (add to Medication List): Wound #2 Right Forearm: Topical Lidocaine 4% cream applied to wound bed prior to debridement (In Clinic Only). Primary Wound Dressing: Wound #2 Right Forearm: Xeroform Secondary Dressing: Wound #2 Right Forearm: Non-adherent pad Dressing Change Frequency: Wound #2 Right Forearm: Change dressing every day. Follow-up Appointments: Wound #2 Right Forearm: Return Appointment in 1 week. BLAYRE, PAPANIA ([id number]) General Notes: conform and coban I am going to suggest at this point in time that we go ahead and initiate treatment with a Xeroform gauze dressing which I think will be better  for her in general by not applying too much moisture they had been using an antibiotic ointment followed by Vaseline. Nonetheless we will still secure this with a nonadherent pad overlying this can be changed every day. Patient and her husband are in agreement with this plan hopefully this will progress rather nicely over the next several weeks. Please see above for specific wound care orders. We will see patient for re-evaluation in 1 week(s) here in the clinic. If anything worsens or changes patient will contact our office for additional recommendations. Electronic Signature(s) Signed: 04/05/2018 1:14:22 PM By: Worthy Keeler PA-C Entered By: Worthy Keeler on 04/05/2018 62:95:28 [place], [name] ([id number]) -------------------------------------------------------------------------------- ROS/PFSH Details Patient Name: [name] Date of Service: 04/04/2018 12:30 PM Medical Record Number: [id number] Patient Account Number: [id number] Date of Birth/Sex: 1935-03-24 (83 y.o. F) Treating RN: Montey Hora Primary Care Provider: Harrel Lemon Other Clinician: Referring Provider: Referral, Self Treating Provider/Extender: Melburn Hake,  Weeks in Treatment: 0 Information Obtained From Patient Wound History Do you currently have one or more open woundso Yes How many open wounds do you currently haveo 1 Approximately how long  have you had your woundso 2 weeks How have you been treating your wound(s) until nowo neosporin Has your wound(s) ever healed and then re-openedo No Have you had any lab work done in the past montho No Have you tested positive for an antibiotic resistant organism (MRSA, VRE)o No Have you tested positive for osteomyelitis (bone infection)o No Have you had any tests for circulation on your legso No Eyes Medical History: Positive for: Cataracts Hematologic/Lymphatic Medical History: Positive for: Anemia Negative for: Hemophilia; Human Immunodeficiency Virus;  Lymphedema; Sickle Cell Disease Respiratory Medical History: Positive for: Chronic Obstructive Pulmonary Disease (COPD) Negative for: Aspiration; Asthma; Pneumothorax; Sleep Apnea; Tuberculosis Cardiovascular Complaints and Symptoms: No Complaints or Symptoms Medical History: Negative for: Angina; Arrhythmia; Congestive Heart Failure; Coronary Artery Disease; Deep Vein Thrombosis; Hypertension; Hypotension; Myocardial Infarction; Peripheral Arterial Disease; Peripheral Venous Disease; Phlebitis; Vasculitis Past Medical History Notes: hyperlipidemia Gastrointestinal Complaints and Symptoms: No Complaints or Symptoms Medical History: Negative for: Cirrhosis ; Colitis; Crohnos; Hepatitis A; Hepatitis B LELANI, GARNETT ([id number]) Endocrine Medical History: Positive for: Type II Diabetes Time with diabetes: 15 years Treated with: Oral agents Blood sugar tested every day: No Oncologic Medical History: Positive for: Received Radiation - 2015 HBO Extended History Items Eyes: Cataracts Immunizations Pneumococcal Vaccine: Received Pneumococcal Vaccination: Yes Implantable Devices Family and Social History Cancer: No; Diabetes: No; Heart Disease: Yes - Mother; Hereditary Spherocytosis: No; Hypertension: No; Kidney Disease: No; Lung Disease: No; Seizures: No; Stroke: No; Thyroid Problems: No; Tuberculosis: No; Current every day smoker - 1 1.5 a day; Marital Status - Married; Alcohol Use: Never; Drug Use: No History; Caffeine Use: Daily; Financial Concerns: No; Food, Clothing or Shelter Needs: No; Support System Lacking: No; Transportation Concerns: No; Advanced Directives: No; Patient does not want information on Advanced Directives; Do not resuscitate: No; Living Will: Yes (Not Provided); Medical Power of Attorney: No Electronic Signature(s) Signed: 04/05/2018 1:14:22 PM By: Worthy Keeler PA-C Signed: 04/05/2018 4:18:46 PM By: Montey Hora Entered By: Montey Hora on  04/04/2018 76:22:63 [place]etser, [name] ([id number]) -------------------------------------------------------------------------------- SuperBill Details Patient Name: Quarry, [name]. Date of Service: 04/04/2018 Medical Record Number: [id number] Patient Account Number: [id number] Date of Birth/Sex: 1935/11/06 (83 y.o. F) Treating RN: Roger Shelter Primary Care Provider: Harrel Lemon Other Clinician: Referring Provider: Referral, Self Treating Provider/Extender: Melburn Hake,  Weeks in Treatment: 0 Diagnosis Coding ICD-10 Codes Code Description S51.801A Unspecified open wound of right forearm, initial encounter L98.492 Non-pressure chronic ulcer of skin of other sites with fat layer exposed E11.622 Type 2 diabetes mellitus with other skin ulcer I89.0 Lymphedema, not elsewhere classified F17.218 Nicotine dependence, cigarettes, with other nicotine-induced disorders Facility Procedures CPT4 Code: 76811572 Description: 99213 - WOUND CARE VISIT-LEV 3 EST PT Modifier: Quantity: 1 Physician Procedures CPT4 Code: 6203559 Description: 99213 - WC PHYS LEVEL 3 - EST PT ICD-10 Diagnosis Description S51.801A Unspecified open wound of right forearm, initial encounter L98.492 Non-pressure chronic ulcer of skin of other sites with fat l E11.622 Type 2 diabetes mellitus with  other skin ulcer I89.0 Lymphedema, not elsewhere classified Modifier: ayer exposed Quantity: 1 Electronic Signature(s) Signed: 04/05/2018 1:14:22 PM By: Worthy Keeler PA-C Entered By: Worthy Keeler on 04/05/2018 13:14:03

## 2018-04-08 NOTE — Progress Notes (Signed)
BREKYN, HUNTOON ([id number]) Visit Report for 04/04/2018 Abuse/Suicide Risk Screen Details Patient Name: [name], [name]. Date of Service: 04/04/2018 12:30 PM Medical Record Number: [id number] Patient Account Number: [id number] Date of Birth/Sex: 1935/05/18 (83 y.o. F) Treating RN: Montey Hora Primary Care Pranathi Winfree: Harrel Lemon Other Clinician: Referring Remo Kirschenmann: Referral, Self Treating Shanetta Nicolls/Extender: STONE III, HOYT Weeks in Treatment: 0 Abuse/Suicide Risk Screen Items Answer ABUSE/SUICIDE RISK SCREEN: Has anyone close to you tried to hurt or harm you recentlyo No Do you feel uncomfortable with anyone in your familyo No Has anyone forced you do things that you didnot want to doo No Do you have any thoughts of harming yourselfo No Patient displays signs or symptoms of abuse and/or neglect. No Electronic Signature(s) Signed: 04/05/2018 4:18:46 PM By: Montey Hora Entered By: Montey Hora on 04/04/2018 09:47:09 Harrigan, [name] ([id number]) -------------------------------------------------------------------------------- Activities of Daily Living Details Patient Name: Scholle, Mikhaila O. Date of Service: 04/04/2018 12:30 PM Medical Record Number: [id number] Patient Account Number: [id number] Date of Birth/Sex: 15-Mar-1935 (83 y.o. F) Treating RN: Montey Hora Primary Care Christyn Gutkowski: Harrel Lemon Other Clinician: Referring Kaydin Labo: Referral, Self Treating Tylek Boney/Extender: Melburn Hake, HOYT Weeks in Treatment: 0 Activities of Daily Living Items Answer Activities of Daily Living (Please select one for each item) Drive Automobile Completely Able Take Medications Completely Able Use Telephone Completely Able Care for Appearance Completely Able Use Toilet Completely Able Bath / Shower Completely Able Dress Self Completely Able Feed Self Completely Able Walk Completely Able Get In / Out Bed Completely Able Housework Completely Able Prepare Meals Completely [place] for Self Completely Able Electronic Signature(s) Signed: 04/05/2018 4:18:46 PM By: Montey Hora Entered By: Montey Hora on 04/04/2018 46:56:81 [place], Shaketta Jenetta Downer ([id number]) -------------------------------------------------------------------------------- Education Assessment Details Patient Name: [name] Date of Service: 04/04/2018 12:30 PM Medical Record Number: [id number] Patient Account Number: [id number] Date of Birth/Sex: 1935/07/30 (83 y.o. F) Treating RN: Montey Hora Primary Care Monchel Pollitt: Harrel Lemon Other Clinician: Referring Brynn Reznik: Referral, Self Treating Kyree Fedorko/Extender: Melburn Hake, HOYT Weeks in Treatment: 0 Primary Learner Assessed: Caregiver Reason Patient is not Primary Learner: wound location Learning Preferences/Education Level/Primary Language Learning Preference: Explanation, Demonstration Highest Education Level: College or Above Preferred Language: English Cognitive Barrier Assessment/Beliefs Language Barrier: No Translator Needed: No Memory Deficit: No Emotional Barrier: No Cultural/Religious Beliefs Affecting Medical Care: No Physical Barrier Assessment Impaired Vision: No Impaired Hearing: No Decreased Hand dexterity: No Knowledge/Comprehension Assessment Knowledge Level: Medium Comprehension Level: Medium Ability to understand written Medium instructions: Ability to understand verbal Medium instructions: Motivation Assessment Anxiety Level: Calm Cooperation: Cooperative Education Importance: Acknowledges Need Interest in Health Problems: Asks Questions Perception: Coherent Willingness to Engage in Self- Medium Management Activities: Readiness to Engage in Self- Medium Management Activities: Electronic Signature(s) Signed: 04/05/2018 4:18:46 PM By: Montey Hora Entered By: Montey Hora on 04/04/2018 16:38:46 Sahm, Bekah Jenetta Downer  ([id number]) -------------------------------------------------------------------------------- Fall Risk Assessment Details Patient Name: Leitz, [name]. Date of Service: 04/04/2018 12:30 PM Medical Record Number: [id number] Patient Account Number: [id number] Date of Birth/Sex: 03/15/1935 (83 y.o. F) Treating RN: Montey Hora Primary Care Dakarai Mcglocklin: Harrel Lemon Other Clinician: Referring Orvel Cutsforth: Referral, Self Treating Herminio Kniskern/Extender: Melburn Hake, HOYT Weeks in Treatment: 0 Fall Risk Assessment Items Have you had 2 or more falls in the last 12 monthso 0 No Have you had any fall that resulted in injury in the last 12 monthso 0 No FALL RISK ASSESSMENT: History of falling - immediate or within 3 months 0 No Secondary diagnosis 0 No Ambulatory aid  None/bed rest/wheelchair/nurse 0 Yes Crutches/cane/walker 0 No Furniture 0 No IV Access/Saline Lock 0 No Gait/Training Normal/bed rest/immobile 0 No Weak 10 Yes Impaired 0 No Mental Status Oriented to own ability 0 Yes Electronic Signature(s) Signed: 04/05/2018 4:18:46 PM By: Montey Hora Entered By: Montey Hora on 04/04/2018 36:62:94 Remer, Jacquie Jenetta Downer ([id number]) -------------------------------------------------------------------------------- Nutrition Risk Assessment Details Patient Name: [name], [name] Date of Service: 04/04/2018 12:30 PM Medical Record Number: [id number] Patient Account Number: [id number] Date of Birth/Sex: January 14, 1935 (83 y.o. F) Treating RN: Montey Hora Primary Care Donnis Phaneuf: Harrel Lemon Other Clinician: Referring Lenville Hibberd: Referral, Self Treating Britten Parady/Extender: STONE III, HOYT Weeks in Treatment: 0 Height (in): 64 Weight (lbs): 142.7 Body Mass Index (BMI): 24.5 Nutrition Risk Assessment Items NUTRITION RISK SCREEN: I have an illness or condition that made me change the kind and/or amount of 0 No food I eat I eat fewer than two meals per day 0 No I eat few fruits and vegetables, or milk  products 0 No I have three or more drinks of beer, liquor or wine almost every day 0 No I have tooth or mouth problems that make it hard for me to eat 0 No I don't always have enough money to buy the food I need 0 No I eat alone most of the time 0 No I take three or more different prescribed or over-the-counter drugs a day 1 Yes Without wanting to, I have lost or gained 10 pounds in the last six months 0 No I am not always physically able to shop, cook and/or feed myself 0 No Nutrition Protocols Good Risk Protocol 0 No interventions needed Moderate Risk Protocol Electronic Signature(s) Signed: 04/05/2018 4:18:46 PM By: Montey Hora Entered By: Montey Hora on 04/04/2018 12:51:36

## 2018-04-09 NOTE — Progress Notes (Signed)
[name] ([id number]) Visit Report for 04/04/2018 Allergy List Details Patient Name: [name], [name]. Date of Service: 04/04/2018 12:30 PM Medical Record Number: [id number] Patient Account Number: [id number] Date of Birth/Sex: 12-04-1935 (83 y.o. F) Treating RN: Montey Hora Primary Care Jhonatan Lomeli: Harrel Lemon Other Clinician: Referring Doll Frazee: Referral, Self Treating Deretha Ertle/Extender: STONE III, HOYT Weeks in Treatment: 0 Allergies Active Allergies Sulfa (Sulfonamide Antibiotics) Allergy Notes Electronic Signature(s) Signed: 04/05/2018 4:18:46 PM By: Montey Hora Entered By: Montey Hora on 04/04/2018 78:67:67 [place], [name] ([id number]) -------------------------------------------------------------------------------- Arrival Information Details Patient Name: [name]. Date of Service: 04/04/2018 12:30 PM Medical Record Number: [id number] Patient Account Number: [id number] Date of Birth/Sex: 08-16-1935 (83 y.o. F) Treating RN: Montey Hora Primary Care Zacariah Belue: Harrel Lemon Other Clinician: Referring Margaretta Chittum: Referral, Self Treating Rod Majerus/Extender: Melburn Hake, HOYT Weeks in Treatment: 0 Visit Information Patient Arrived: Ambulatory Arrival Time: 12:47 Accompanied By: spouse Transfer Assistance: None Patient Identification Verified: Yes Secondary Verification Process Completed: Yes Patient Has Alerts: Yes Patient Alerts: DMII History Since Last Visit Added or deleted any medications: No Any new allergies or adverse reactions: No Had a fall or experienced change in activities of daily living that may affect risk of falls: No Signs or symptoms of abuse/neglect since last visito No Hospitalized since last visit: No Implantable device outside of the clinic excluding cellular tissue based products placed in the center since last visit: No Electronic Signature(s) Signed: 04/05/2018 4:18:46 PM By: Montey Hora Entered By: Montey Hora on  04/04/2018 54:65:03 Simi, Peyson Jenetta Downer ([id number]) -------------------------------------------------------------------------------- Clinic Level of Care Assessment Details Patient Name: [name], [name]. Date of Service: 04/04/2018 12:30 PM Medical Record Number: [id number] Patient Account Number: [id number] Date of Birth/Sex: June 15, 1935 (83 y.o. F) Treating RN: Roger Shelter Primary Care Rasha Ibe: Harrel Lemon Other Clinician: Referring Oluwaferanmi Wain: Referral, Self Treating Kieara Schwark/Extender: Melburn Hake, HOYT Weeks in Treatment: 0 Clinic Level of Care Assessment Items TOOL 2 Quantity Score X - Use when only an EandM is performed on the INITIAL visit 1 0 ASSESSMENTS - Nursing Assessment / Reassessment X - General Physical Exam (combine w/ comprehensive assessment (listed just below) when 1 20 performed on new pt. evals) X- 1 25 Comprehensive Assessment (HX, ROS, Risk Assessments, Wounds Hx, etc.) ASSESSMENTS - Wound and Skin Assessment / Reassessment X - Simple Wound Assessment / Reassessment - one wound 1 5 []  - 0 Complex Wound Assessment / Reassessment - multiple wounds []  - 0 Dermatologic / Skin Assessment (not related to wound area) ASSESSMENTS - Ostomy and/or Continence Assessment and Care []  - Incontinence Assessment and Management 0 []  - 0 Ostomy Care Assessment and Management (repouching, etc.) PROCESS - Coordination of Care X - Simple Patient / Family Education for ongoing care 1 15 []  - 0 Complex (extensive) Patient / Family Education for ongoing care []  - 0 Staff obtains Programmer, systems, Records, Test Results / Process Orders []  - 0 Staff telephones HHA, Nursing Homes / Clarify orders / etc []  - 0 Routine Transfer to another Facility (non-emergent condition) []  - 0 Routine Hospital Admission (non-emergent condition) []  - 0 New Admissions / Biomedical engineer / Ordering NPWT, Apligraf, etc. []  - 0 Emergency Hospital Admission (emergent condition) X- 1 10 Simple  Discharge Coordination []  - 0 Complex (extensive) Discharge Coordination PROCESS - Special Needs []  - Pediatric / Minor Patient Management 0 []  - 0 Isolation Patient Management Chalfin, Lakenzie O. ([id number]) []  - 0 Hearing / Language / Visual special needs []  - 0 Assessment of Community assistance (transportation, D/C planning, etc.) []  -  0 Additional assistance / Altered mentation []  - 0 Support Surface(s) Assessment (bed, cushion, seat, etc.) INTERVENTIONS - Wound Cleansing / Measurement X - Wound Imaging (photographs - any number of wounds) 1 5 []  - 0 Wound Tracing (instead of photographs) X- 1 5 Simple Wound Measurement - one wound []  - 0 Complex Wound Measurement - multiple wounds X- 1 5 Simple Wound Cleansing - one wound []  - 0 Complex Wound Cleansing - multiple wounds INTERVENTIONS - Wound Dressings X - Small Wound Dressing one or multiple wounds 1 10 []  - 0 Medium Wound Dressing one or multiple wounds []  - 0 Large Wound Dressing one or multiple wounds []  - 0 Application of Medications - injection INTERVENTIONS - Miscellaneous []  - External ear exam 0 []  - 0 Specimen Collection (cultures, biopsies, blood, body fluids, etc.) []  - 0 Specimen(s) / Culture(s) sent or taken to Lab for analysis []  - 0 Patient Transfer (multiple staff / Civil Service fast streamer / Similar devices) []  - 0 Simple Staple / Suture removal (25 or less) []  - 0 Complex Staple / Suture removal (26 or more) []  - 0 Hypo / Hyperglycemic Management (close monitor of Blood Glucose) []  - 0 Ankle / Brachial Index (ABI) - do not check if billed separately Has the patient been seen at the hospital within the last three years: Yes Total Score: 100 Level Of Care: New/Established - Level 3 Electronic Signature(s) Signed: 04/04/2018 4:40:07 PM By: Roger Shelter Entered By: Roger Shelter on 04/04/2018 01:02:72 Pascal, [name]  ([id number]) -------------------------------------------------------------------------------- Encounter Discharge Information Details Patient Name: [name]. Date of Service: 04/04/2018 12:30 PM Medical Record Number: [id number] Patient Account Number: [id number] Date of Birth/Sex: 11-24-1935 (83 y.o. F) Treating RN: Roger Shelter Primary Care Tulani Kidney: Harrel Lemon Other Clinician: Referring Arthella Headings: Referral, Self Treating Daron Breeding/Extender: Melburn Hake, HOYT Weeks in Treatment: 0 Encounter Discharge Information Items Discharge Pain Level: 0 Discharge Condition: Stable Ambulatory Status: Ambulatory Discharge Destination: Home Transportation: Private Auto Schedule Follow-up Appointment: Yes Medication Reconciliation completed and No provided to Patient/Care Miciah Shealy: Provided on Clinical Summary of Care: 04/04/2018 Form Type Recipient Paper Patient AM Electronic Signature(s) Signed: 04/06/2018 3:43:47 PM By: Ruthine Dose Entered By: Ruthine Dose on 04/04/2018 75:64:33 Perrinton, Trevor Jenetta Downer ([id number]) -------------------------------------------------------------------------------- Lower Extremity Assessment Details Patient Name: Fairbairn, Tangela O. Date of Service: 04/04/2018 12:30 PM Medical Record Number: [id number] Patient Account Number: [id number] Date of Birth/Sex: [birth year] (83 y.o. F) Treating RN: Montey Hora Primary Care Kirstine Jacquin: Harrel Lemon Other Clinician: Referring Odelle Kosier: Referral, Self Treating Jacquan Savas/Extender: Melburn Hake, HOYT Weeks in Treatment: 0 Electronic Signature(s) Signed: 04/05/2018 4:18:46 PM By: Montey Hora Entered By: Montey Hora on 04/04/2018 09:32:35 [place], [name] ([id number]) -------------------------------------------------------------------------------- Multi Wound Chart Details Patient Name: Badgett, [name]. Date of Service: 04/04/2018 12:30 PM Medical Record Number: [id number] Patient Account Number: [id number] Date of  Birth/Sex: 05-25-1935 (83 y.o. F) Treating RN: Roger Shelter Primary Care Tram Wrenn: Harrel Lemon Other Clinician: Referring Isola Mehlman: Referral, Self Treating Selah Klang/Extender: STONE III, HOYT Weeks in Treatment: 0 Vital Signs Height(in): 64 Pulse(bpm): 57 Weight(lbs): 142 Blood Pressure(mmHg): 138/70 Body Mass Index(BMI): 24 Temperature(F): 98.4 Respiratory Rate 18 (breaths/min): Photos: [2:No Photos] [N/A:N/A] Wound Location: [2:Right Forearm] [N/A:N/A] Wounding Event: [2:Trauma] [N/A:N/A] Primary Etiology: [2:Trauma, Other] [N/A:N/A] Comorbid History: [2:Cataracts, Anemia, Chronic Obstructive Pulmonary Disease (COPD), Type II Diabetes, Received Radiation] [N/A:N/A] Date Acquired: [2:03/22/2018] [N/A:N/A] Weeks of Treatment: [2:0] [N/A:N/A] Wound Status: [2:Open] [N/A:N/A] Measurements L x W x D [2:1.2x3.4x0.1] [N/A:N/A] (cm) Area (cm) : [2:3.204] [N/A:N/A] Volume (cm) : [2:0.32] [N/A:N/A] Classification: [  2:Full Thickness Without Exposed Support Structures] [N/A:N/A] Exudate Amount: [2:Large] [N/A:N/A] Exudate Type: [2:Serosanguineous] [N/A:N/A] Exudate Color: [2:red, brown] [N/A:N/A] Wound Margin: [2:Distinct, outline attached] [N/A:N/A] Granulation Amount: [2:Large (67-100%)] [N/A:N/A] Granulation Quality: [2:Red] [N/A:N/A] Necrotic Amount: [2:Small (1-33%)] [N/A:N/A] Exposed Structures: [2:Fascia: No Fat Layer (Subcutaneous Tissue) Exposed: No Tendon: No Muscle: No Joint: No Bone: No] [N/A:N/A] Epithelialization: [2:Medium (34-66%)] [N/A:N/A] Periwound Skin Texture: [2:No Abnormalities Noted] [N/A:N/A] Periwound Skin Moisture: [2:No Abnormalities Noted] [N/A:N/A] Periwound Skin Color: [2:No Abnormalities Noted] [N/A:N/A] Temperature: [2:No Abnormality] [N/A:N/A] Tenderness on Palpation: [2:Yes] [N/A:N/A] Wound Preparation: Ulcer Cleansing: N/A N/A Rinsed/Irrigated with Saline Topical Anesthetic Applied: Other: lidocaine 4% Treatment Notes Electronic  Signature(s) Signed: 04/04/2018 4:40:07 PM By: Roger Shelter Entered By: Roger Shelter on 04/04/2018 01:60:10 Macmillan, [name] ([id number]) -------------------------------------------------------------------------------- Multi-Disciplinary Care Plan Details Patient Name: [name] Date of Service: 04/04/2018 12:30 PM Medical Record Number: [id number] Patient Account Number: [id number] Date of Birth/Sex: 09/16/1935 (83 y.o. F) Treating RN: Roger Shelter Primary Care Charlotta Lapaglia: Harrel Lemon Other Clinician: Referring Clance Baquero: Referral, Self Treating Norval Slaven/Extender: Melburn Hake, HOYT Weeks in Treatment: 0 Active Inactive ` Orientation to the Wound Care Program Nursing Diagnoses: Knowledge deficit related to the wound healing center program Goals: Patient/caregiver will verbalize understanding of the [facility] Program Date Initiated: 04/04/2018 Target Resolution Date: 04/25/2018 Goal Status: Active Interventions: Provide education on orientation to the wound center Notes: ` Wound/Skin Impairment Nursing Diagnoses: Impaired tissue integrity Goals: Patient/caregiver will verbalize understanding of skin care regimen Date Initiated: 04/04/2018 Target Resolution Date: 04/24/2018 Goal Status: Active Ulcer/skin breakdown will have a volume reduction of 30% by week 4 Date Initiated: 04/04/2018 Target Resolution Date: 04/24/2018 Goal Status: Active Interventions: Assess patient/caregiver ability to obtain necessary supplies Assess patient/caregiver ability to perform ulcer/skin care regimen upon admission and as needed Assess ulceration(s) every visit Treatment Activities: Skin care regimen initiated : 04/04/2018 Notes: Electronic Signature(s) Signed: 04/04/2018 4:40:07 PM By: Jearld Adjutant, [name] ([id number]) Entered By: Roger Shelter on 04/04/2018 17:61:60 [place], Jireh Jenetta Downer  ([id number]) -------------------------------------------------------------------------------- Pain Assessment Details Patient Name: [name]. Date of Service: 04/04/2018 12:30 PM Medical Record Number: [id number] Patient Account Number: [id number] Date of Birth/Sex: 1935-02-15 (83 y.o. F) Treating RN: Montey Hora Primary Care Talayah Picardi: Harrel Lemon Other Clinician: Referring Lorali Khamis: Referral, Self Treating Laquinta Hazell/Extender: Melburn Hake, HOYT Weeks in Treatment: 0 Active Problems Location of Pain Severity and Description of Pain Patient Has Paino No Site Locations Pain Management and Medication Current Pain Management: Electronic Signature(s) Signed: 04/05/2018 4:18:46 PM By: Montey Hora Entered By: Montey Hora on 04/04/2018 50:09:38 Runnels, [name] ([id number]) -------------------------------------------------------------------------------- Patient/Caregiver Education Details Patient Name: [name] Date of Service: 04/04/2018 12:30 PM Medical Record Number: [id number] Patient Account Number: [id number] Date of Birth/Gender: [birth year] (83 y.o. F) Treating RN: Roger Shelter Primary Care Physician: Harrel Lemon Other Clinician: Referring Physician: Referral, Self Treating Physician/Extender: Sharalyn Ink in Treatment: 0 Education Assessment Education Provided To: Patient Education Topics Provided Welcome To [facility]: Handouts: Welcome To [facility] [place] Methods: Explain/Verbal Responses: State content correctly Wound/Skin Impairment: Handouts: Caring for Your Ulcer Methods: Explain/Verbal Responses: State content correctly Electronic Signature(s) Signed: 04/04/2018 4:40:07 PM By: Roger Shelter Entered By: Roger Shelter on 04/04/2018 17:51:02 [place], Mariacristina Jenetta Downer ([id number]) -------------------------------------------------------------------------------- Wound Assessment Details Patient Name: Starling, Addalyne O. Date  of Service: 04/04/2018 12:30 PM Medical Record Number: [id number] Patient Account Number: [id number] Date of Birth/Sex: 1935-05-03 (83 y.o. F) Treating RN: Montey Hora Primary Care Jyron Turman: Harrel Lemon Other Clinician: Referring  Syed Zukas: Referral, Self Treating Torrance Stockley/Extender: STONE III, HOYT Weeks in Treatment: 0 Wound Status Wound Number: 2 Primary Trauma, Other Etiology: Wound Location: Right Forearm Wound Open Wounding Event: Trauma Status: Date Acquired: 03/22/2018 Comorbid Cataracts, Anemia, Chronic Obstructive Weeks Of Treatment: 0 History: Pulmonary Disease (COPD), Type II Diabetes, Clustered Wound: No Received Radiation Photos Photo Uploaded By: Montey Hora on 04/04/2018 13:42:10 Wound Measurements Length: (cm) 1.2 Width: (cm) 3.4 Depth: (cm) 0.1 Area: (cm) 3.204 Volume: (cm) 0.32 % Reduction in Area: % Reduction in Volume: Epithelialization: Medium (34-66%) Tunneling: No Undermining: No Wound Description Full Thickness Without Exposed Support Classification: Structures Wound Margin: Distinct, outline attached Exudate Large Amount: Exudate Type: Serosanguineous Exudate Color: red, brown Foul Odor After Cleansing: No Slough/Fibrino Yes Wound Bed Granulation Amount: Large (67-100%) Exposed Structure Granulation Quality: Red Fascia Exposed: No Necrotic Amount: Small (1-33%) Fat Layer (Subcutaneous Tissue) Exposed: No Necrotic Quality: Adherent Slough Tendon Exposed: No Muscle Exposed: No Joint Exposed: No Bone Exposed: No Spangle, Jasara O. ([id number]) Periwound Skin Texture Texture Color No Abnormalities Noted: No No Abnormalities Noted: No Moisture Temperature / Pain No Abnormalities Noted: No Temperature: No Abnormality Tenderness on Palpation: Yes Wound Preparation Ulcer Cleansing: Rinsed/Irrigated with Saline Topical Anesthetic Applied: Other: lidocaine 4%, Treatment Notes Wound #2 (Right Forearm) 1. Cleansed with: Clean  wound with Normal Saline 2. Anesthetic Topical Lidocaine 4% cream to wound bed prior to debridement 4. Dressing Applied: Xeroform 5. Secondary Dressing Applied Non-Adherent pad Notes conform and coban with tape Electronic Signature(s) Signed: 04/05/2018 4:18:46 PM By: Montey Hora Entered By: Montey Hora on 04/04/2018 44:81:85 Lippens, [name] ([id number]) -------------------------------------------------------------------------------- Vitals Details Patient Name: Justen, [name]. Date of Service: 04/04/2018 12:30 PM Medical Record Number: [id number] Patient Account Number: [id number] Date of Birth/Sex: [birth year] (83 y.o. F) Treating RN: Montey Hora Primary Care Britiney Blahnik: Harrel Lemon Other Clinician: Referring Mikalyn Hermida: Referral, Self Treating Emoree Sasaki/Extender: Melburn Hake, HOYT Weeks in Treatment: 0 Vital Signs Time Taken: 12:51 Temperature (F): 98.4 Height (in): 64 Pulse (bpm): 87 Source: Measured Respiratory Rate (breaths/min): 18 Weight (lbs): 142 Blood Pressure (mmHg): 138/70 Source: Measured Reference Range: 80 - 120 mg / dl Body Mass Index (BMI): 24.4 Electronic Signature(s) Signed: 04/05/2018 4:18:46 PM By: Montey Hora Entered By: Montey Hora on 04/04/2018 12:53:29

## 2018-04-12 ENCOUNTER — Encounter: Payer: Medicare Other | Admitting: Physician Assistant

## 2018-04-12 DIAGNOSIS — E11622 Type 2 diabetes mellitus with other skin ulcer: Secondary | ICD-10-CM | POA: Diagnosis not present

## 2018-04-16 NOTE — Progress Notes (Signed)
AVYANNA, SPADA ([id number]) Visit Report for 04/12/2018 Arrival Information Details Patient Name: [name], [name]. Date of Service: 04/12/2018 12:30 PM Medical Record Number: [id number] Patient Account Number: [id number] Date of Birth/Sex: 03/20/1935 (83 y.o. F) Treating RN: Montey Hora Primary Care Byran Bilotti: Harrel Lemon Other Clinician: Referring Su Duma: Harrel Lemon Treating Kalix Meinecke/Extender: Melburn Hake, HOYT Weeks in Treatment: 1 Visit Information History Since Last Visit Added or deleted any medications: No Patient Arrived: Ambulatory Any new allergies or adverse reactions: No Arrival Time: 12:44 Had a fall or experienced change in No Accompanied By: self activities of daily living that may affect Transfer Assistance: None risk of falls: Patient Identification Verified: Yes Signs or symptoms of abuse/neglect since last visito No Secondary Verification Process Completed: Yes Hospitalized since last visit: No Patient Has Alerts: Yes Implantable device outside of the clinic excluding No Patient Alerts: DMII cellular tissue based products placed in the center since last visit: Has Dressing in Place as Prescribed: Yes Pain Present Now: No Electronic Signature(s) Signed: 04/12/2018 3:57:25 PM By: Montey Hora Entered By: Montey Hora on 04/12/2018 95:18:84 Coad, Juanna Jenetta [name] ([id number]) -------------------------------------------------------------------------------- Clinic Level of Care Assessment Details Patient Name: [name], [name]. Date of Service: 04/12/2018 12:30 PM Medical Record Number: [id number] Patient Account Number: [id number] Date of Birth/Sex: 1935-10-07 (83 y.o. F) Treating RN: Ahmed Prima Primary Care Cristi Gwynn: Harrel Lemon Other Clinician: Referring Anabela Crayton: Harrel Lemon Treating Ebone Alcivar/Extender: Melburn Hake, HOYT Weeks in Treatment: 1 Clinic Level of Care Assessment Items TOOL 4 Quantity Score X - Use when only an EandM is performed on  FOLLOW-UP visit 1 0 ASSESSMENTS - Nursing Assessment / Reassessment X - Reassessment of Co-morbidities (includes updates in patient status) 1 10 X- 1 5 Reassessment of Adherence to Treatment Plan ASSESSMENTS - Wound and Skin Assessment / Reassessment X - Simple Wound Assessment / Reassessment - one wound 1 5 []  - 0 Complex Wound Assessment / Reassessment - multiple wounds []  - 0 Dermatologic / Skin Assessment (not related to wound area) ASSESSMENTS - Focused Assessment []  - Circumferential Edema Measurements - multi extremities 0 []  - 0 Nutritional Assessment / Counseling / Intervention []  - 0 Lower Extremity Assessment (monofilament, tuning fork, pulses) []  - 0 Peripheral Arterial Disease Assessment (using hand held doppler) ASSESSMENTS - Ostomy and/or Continence Assessment and Care []  - Incontinence Assessment and Management 0 []  - 0 Ostomy Care Assessment and Management (repouching, etc.) PROCESS - Coordination of Care X - Simple Patient / Family Education for ongoing care 1 15 []  - 0 Complex (extensive) Patient / Family Education for ongoing care []  - 0 Staff obtains Programmer, systems, Records, Test Results / Process Orders []  - 0 Staff telephones HHA, Nursing Homes / Clarify orders / etc []  - 0 Routine Transfer to another Facility (non-emergent condition) []  - 0 Routine Hospital Admission (non-emergent condition) []  - 0 New Admissions / Biomedical engineer / Ordering NPWT, Apligraf, etc. []  - 0 Emergency Hospital Admission (emergent condition) X- 1 10 Simple Discharge Coordination [name], [name]. ([id number]) []  - 0 Complex (extensive) Discharge Coordination PROCESS - Special Needs []  - Pediatric / Minor Patient Management 0 []  - 0 Isolation Patient Management []  - 0 Hearing / Language / Visual special needs []  - 0 Assessment of Community assistance (transportation, D/C planning, etc.) []  - 0 Additional assistance / Altered mentation []  - 0 Support Surface(s)  Assessment (bed, cushion, seat, etc.) INTERVENTIONS - Wound Cleansing / Measurement X - Simple Wound Cleansing - one wound 1 5 []  - 0 Complex Wound Cleansing -  multiple wounds X- 1 5 Wound Imaging (photographs - any number of wounds) []  - 0 Wound Tracing (instead of photographs) X- 1 5 Simple Wound Measurement - one wound []  - 0 Complex Wound Measurement - multiple wounds INTERVENTIONS - Wound Dressings X - Small Wound Dressing one or multiple wounds 1 10 []  - 0 Medium Wound Dressing one or multiple wounds []  - 0 Large Wound Dressing one or multiple wounds X- 1 5 Application of Medications - topical []  - 0 Application of Medications - injection INTERVENTIONS - Miscellaneous []  - External ear exam 0 []  - 0 Specimen Collection (cultures, biopsies, blood, body fluids, etc.) []  - 0 Specimen(s) / Culture(s) sent or taken to Lab for analysis []  - 0 Patient Transfer (multiple staff / Civil Service fast streamer / Similar devices) []  - 0 Simple Staple / Suture removal (25 or less) []  - 0 Complex Staple / Suture removal (26 or more) []  - 0 Hypo / Hyperglycemic Management (close monitor of Blood Glucose) []  - 0 Ankle / Brachial Index (ABI) - do not check if billed separately X- 1 5 Vital Signs [name], [name] ([id number]) Has the patient been seen at the hospital within the last three years: Yes Total Score: 80 Level Of Care: New/Established - Level 3 Electronic Signature(s) Signed: 04/13/2018 4:27:21 PM By: Alric Quan Entered By: Alric Quan on 04/12/2018 89:38:10 [name], [name] ([id number]) -------------------------------------------------------------------------------- Encounter Discharge Information Details Patient Name: [name]. Date of Service: 04/12/2018 12:30 PM Medical Record Number: [id number] Patient Account Number: [id number] Date of Birth/Sex: September 12, 1935 (83 y.o. F) Treating RN: Roger Shelter Primary Care [place] Rosamond: Harrel Lemon Other Clinician: Referring  Lajuan Kovaleski: Harrel Lemon Treating Curley Fayette/Extender: Melburn Hake, HOYT Weeks in Treatment: 1 Encounter Discharge Information Items Discharge Pain Level: 0 Discharge Condition: Stable Ambulatory Status: Ambulatory Discharge Destination: Home Private Transportation: Auto Schedule Follow-up Appointment: Yes Medication Reconciliation completed and provided No to Patient/Care Ifeoma Vallin: Clinical Summary of Care: Electronic Signature(s) Signed: 04/12/2018 3:57:53 PM By: Roger Shelter Entered By: Roger Shelter on 04/12/2018 36:14:43 Jakes, Everlean Jenetta [name] ([id number]) -------------------------------------------------------------------------------- Lower Extremity Assessment Details Patient Name: [name], [name]. Date of Service: 04/12/2018 12:30 PM Medical Record Number: [id number] Patient Account Number: [id number] Date of Birth/Sex: January 31, 1935 (83 y.o. F) Treating RN: Montey Hora Primary Care Rishika Mccollom: Harrel Lemon Other Clinician: Referring Lucious Zou: Harrel Lemon Treating Xian Apostol/Extender: Melburn Hake, HOYT Weeks in Treatment: 1 Electronic Signature(s) Signed: 04/12/2018 3:57:25 PM By: Montey Hora Entered By: Montey Hora on 04/12/2018 12:45:80 [place], Mckynlie Jenetta [name] ([id number]) -------------------------------------------------------------------------------- Multi Wound Chart Details Patient Name: [name], [name] Date of Service: 04/12/2018 12:30 PM Medical Record Number: [id number] Patient Account Number: [id number] Date of Birth/Sex: 11/09/1935 (83 y.o. F) Treating RN: Ahmed Prima Primary Care Owens Hara: Harrel Lemon Other Clinician: Referring Shantasia [name]: Harrel Lemon Treating Ashanti Littles/Extender: Melburn Hake, HOYT Weeks in Treatment: 1 Vital Signs Height(in): 64 Pulse(bpm): 33 Weight(lbs): 142 Blood Pressure(mmHg): 131/56 Body Mass Index(BMI): 24 Temperature(F): 98.2 Respiratory Rate 16 (breaths/min): Photos: [N/A:N/A] Wound Location: Right Forearm N/A  N/A Wounding Event: Trauma N/A N/A Primary Etiology: Trauma, Other N/A N/A Comorbid History: Cataracts, Anemia, Chronic N/A N/A Obstructive Pulmonary Disease (COPD), Type II Diabetes, Received Radiation Date Acquired: 03/22/2018 N/A N/A Weeks of Treatment: 1 N/A N/A Wound Status: Open N/A N/A Measurements L x W x D 0.2x0.2x0.1 N/A N/A (cm) Area (cm) : 0.031 N/A N/A Volume (cm) : 0.003 N/A N/A % Reduction in Area: 99.00% N/A N/A % Reduction in Volume: 99.10% N/A N/A Classification: Full Thickness Without N/A N/A Exposed Support Structures Exudate  Amount: Small N/A N/A Exudate Type: Serous N/A N/A Exudate Color: amber N/A N/A Wound Margin: Distinct, outline attached N/A N/A Granulation Amount: Large (67-100%) N/A N/A Granulation Quality: Red N/A N/A Necrotic Amount: None Present (0%) N/A N/A Exposed Structures: Fascia: No N/A N/A Fat Layer (Subcutaneous Tissue) Exposed: No Tendon: No Morocho, Miryah O. ([id number]) Muscle: No Joint: No Bone: No Epithelialization: Large (67-100%) N/A N/A Periwound Skin Texture: No Abnormalities Noted N/A N/A Periwound Skin Moisture: No Abnormalities Noted N/A N/A Periwound Skin Color: No Abnormalities Noted N/A N/A Temperature: No Abnormality N/A N/A Tenderness on Palpation: Yes N/A N/A Wound Preparation: Ulcer Cleansing: N/A N/A Rinsed/Irrigated with Saline Topical Anesthetic Applied: Other: lidocaine 4% Treatment Notes Electronic Signature(s) Signed: 04/13/2018 4:27:21 PM By: Alric Quan Entered By: Alric Quan on 04/12/2018 13:24:40 [place], [name] ([id number]) -------------------------------------------------------------------------------- Multi-Disciplinary Care Plan Details Patient Name: [name] Date of Service: 04/12/2018 12:30 PM Medical Record Number: [id number] Patient Account Number: [id number] Date of Birth/Sex: Oct 25, 1935 (83 y.o. F) Treating RN: Ahmed Prima Primary Care Lakethia Coppess: Harrel Lemon  Other Clinician: Referring Peni Rupard: Harrel Lemon Treating Tekoa Hamor/Extender: Melburn Hake, HOYT Weeks in Treatment: 1 Active Inactive ` Orientation to the Wound Care Program Nursing Diagnoses: Knowledge deficit related to the wound healing center program Goals: Patient/caregiver will verbalize understanding of the [facility] Program Date Initiated: 04/04/2018 Target Resolution Date: 04/25/2018 Goal Status: Active Interventions: Provide education on orientation to the wound center Notes: ` Wound/Skin Impairment Nursing Diagnoses: Impaired tissue integrity Goals: Patient/caregiver will verbalize understanding of skin care regimen Date Initiated: 04/04/2018 Target Resolution Date: 04/24/2018 Goal Status: Active Ulcer/skin breakdown will have a volume reduction of 30% by week 4 Date Initiated: 04/04/2018 Target Resolution Date: 04/24/2018 Goal Status: Active Interventions: Assess patient/caregiver ability to obtain necessary supplies Assess patient/caregiver ability to perform ulcer/skin care regimen upon admission and as needed Assess ulceration(s) every visit Treatment Activities: Skin care regimen initiated : 04/04/2018 Notes: Electronic Signature(s) Signed: 04/13/2018 4:27:21 PM By: Karie Kirks, [name] ([id number]) Entered By: Alric Quan on 04/12/2018 33:29:51 [place], Serrena Jenetta [name] ([id number]) -------------------------------------------------------------------------------- Pain Assessment Details Patient Name: [name], [name]. Date of Service: 04/12/2018 12:30 PM Medical Record Number: [id number] Patient Account Number: [id number] Date of Birth/Sex: December 01, 1935 (83 y.o. F) Treating RN: Montey Hora Primary Care Laranda Burkemper: Harrel Lemon Other Clinician: Referring Maci Eickholt: Harrel Lemon Treating Genni Buske/Extender: Melburn Hake, HOYT Weeks in Treatment: 1 Active Problems Location of Pain Severity and Description of Pain Patient Has Paino No Site  Locations Pain Management and Medication Current Pain Management: Electronic Signature(s) Signed: 04/12/2018 3:57:25 PM By: Montey Hora Entered By: Montey Hora on 04/12/2018 35:57:32 Schlesinger, [name] ([id number]) -------------------------------------------------------------------------------- Patient/Caregiver Education Details Patient Name: [name] Date of Service: 04/12/2018 12:30 PM Medical Record Number: [id number] Patient Account Number: [id number] Date of Birth/Gender: [birth year] (83 y.o. F) Treating RN: Roger Shelter Primary Care Physician: Harrel Lemon Other Clinician: Referring Physician: Harrel Lemon Treating Physician/Extender: Sharalyn Ink in Treatment: 1 Education Assessment Education Provided To: Patient Education Topics Provided Wound/Skin Impairment: Handouts: Skin Care Do's and Dont's Methods: Explain/Verbal Responses: State content correctly Electronic Signature(s) Signed: 04/12/2018 3:57:53 PM By: Roger Shelter Entered By: Roger Shelter on 04/12/2018 17:61:60 [name], [name] ([id number]) -------------------------------------------------------------------------------- Wound Assessment Details Patient Name: [name], [name]. Date of Service: 04/12/2018 12:30 PM Medical Record Number: [id number] Patient Account Number: [id number] Date of Birth/Sex: 1935-04-29 (83 y.o. F) Treating RN: Montey Hora Primary Care Azrael Huss: Harrel Lemon Other Clinician: Referring Avriana Joo: Harrel Lemon Treating Cregg Jutte/Extender: Melburn Hake, HOYT Weeks  in Treatment: 1 Wound Status Wound Number: 2 Primary Trauma, Other Etiology: Wound Location: Right Forearm Wound Open Wounding Event: Trauma Status: Date Acquired: 03/22/2018 Comorbid Cataracts, Anemia, Chronic Obstructive Weeks Of Treatment: 1 History: Pulmonary Disease (COPD), Type II Diabetes, Clustered Wound: No Received Radiation Photos Photo Uploaded By: Montey Hora on 04/12/2018  12:53:20 Wound Measurements Length: (cm) 0.2 Width: (cm) 0.2 Depth: (cm) 0.1 Area: (cm) 0.031 Volume: (cm) 0.003 % Reduction in Area: 99% % Reduction in Volume: 99.1% Epithelialization: Large (67-100%) Tunneling: No Undermining: No Wound Description Full Thickness Without Exposed Support Classification: Structures Wound Margin: Distinct, outline attached Exudate Small Amount: Exudate Type: Serous Exudate Color: amber Foul Odor After Cleansing: No Slough/Fibrino Yes Wound Bed Granulation Amount: Large (67-100%) Exposed Structure Granulation Quality: Red Fascia Exposed: No Necrotic Amount: None Present (0%) Fat Layer (Subcutaneous Tissue) Exposed: No Tendon Exposed: No Muscle Exposed: No Joint Exposed: No Bone Exposed: No Loeper, Kalissa O. ([id number]) Periwound Skin Texture Texture Color No Abnormalities Noted: No No Abnormalities Noted: No Moisture Temperature / Pain No Abnormalities Noted: No Temperature: No Abnormality Tenderness on Palpation: Yes Wound Preparation Ulcer Cleansing: Rinsed/Irrigated with Saline Topical Anesthetic Applied: Other: lidocaine 4%, Treatment Notes Wound #2 (Right Forearm) 1. Cleansed with: Clean wound with Normal Saline 2. Anesthetic Topical Lidocaine 4% cream to wound bed prior to debridement 4. Dressing Applied: Xeroform 5. Secondary Dressing Applied Kerlix/Conform Non-Adherent pad Notes conform stretch net Electronic Signature(s) Signed: 04/12/2018 3:57:25 PM By: Montey Hora Entered By: Montey Hora on 04/12/2018 08:65:78 Rodak, Elea Jenetta [name] ([id number]) -------------------------------------------------------------------------------- Vitals Details Patient Name: Enne[place], [name]. Date of Service: 04/12/2018 12:30 PM Medical Record Number: [id number] Patient Account Number: [id number] Date of Birth/Sex: 1935-03-14 (83 y.o. F) Treating RN: Montey Hora Primary Care Earland Reish: Harrel Lemon Other Clinician: Referring  Kathrynne Kulinski: Harrel Lemon Treating Verley Pariseau/Extender: Melburn Hake, HOYT Weeks in Treatment: 1 Vital Signs Time Taken: 12:46 Temperature (F): 98.2 Height (in): 64 Pulse (bpm): 92 Weight (lbs): 142 Respiratory Rate (breaths/min): 16 Body Mass Index (BMI): 24.4 Blood Pressure (mmHg): 131/56 Reference Range: 80 - 120 mg / dl Electronic Signature(s) Signed: 04/12/2018 3:57:25 PM By: Montey Hora Entered By: Montey Hora on 04/12/2018 12:46:22

## 2018-04-16 NOTE — Progress Notes (Signed)
KAYLY, KRIEGEL ([id number]) Visit Report for 04/12/2018 Chief Complaint Document Details Patient Name: [name], [name]. Date of Service: 04/12/2018 12:30 PM Medical Record Number: [id number] Patient Account Number: [id number] Date of Birth/Sex: 05/17/1935 (83 y.o. F) Treating RN: Ahmed Prima Primary Care Provider: Harrel Lemon Other Clinician: Referring Provider: Harrel Lemon Treating Provider/Extender: Melburn Hake, HOYT Weeks in Treatment: 1 Information Obtained from: Patient Chief Complaint Right forearm skin tear Electronic Signature(s) Signed: 04/12/2018 5:03:20 PM By: Worthy Keeler PA-C Entered By: Worthy Keeler on 04/12/2018 12:45:80 Weseman, Maurine [name] ([id number]) -------------------------------------------------------------------------------- HPI Details Patient Name: [name] Date of Service: 04/12/2018 12:30 PM Medical Record Number: [id number] Patient Account Number: [id number] Date of Birth/Sex: [birth year] (83 y.o. F) Treating RN: Ahmed Prima Primary Care Provider: Harrel Lemon Other Clinician: Referring Provider: Harrel Lemon Treating Provider/Extender: Melburn Hake, HOYT Weeks in Treatment: 1 History of Present Illness HPI Description: 83 year old patient who is known to have diabetes mellitus and tobacco abuse has had a injury to the right posterior calf about a month ago. He has been treated with local care and 2 courses of antibiotics which include Keflex and doxycycline which she has completed. Smokes about a pack and a half of cigarettes a day and has a past medical history of COPD, diabetes mellitus, hyperlipidemia,psoriasis and hypothyroidism. Readmission: 04/04/18 patient presents today for a initial evaluation today concerning a right forearm skin tear which has been present for about two weeks. Currently need this point has been applied to the wound bed followed by a protective dressing that has been secured by wrapping instead of adhesive's.  Currently the patient's wound does seem to be doing better patient and her husband states that the excess skin from the skin tear was actually trimmed away prior to starting the wound care they have been performing. With that being said she seems to be doing well there's no evidence of infection which is good news and overall the wound seems to be showing signs of good granulation and overall improvement which is great news. Fortunately she has no with valid bladder dysfunction unexpected fevers chills or weight loss. This seems to be rather straightforward in my opinion in regard to treatment. 04/12/18 on evaluation today patient appears to be doing very well in regard to her right forearm skin tear. She's been tolerating the dressing changes without complication. In general this appears to show signs of great improvement since last time I saw her. Overall I'm very pleased with how things are going patient still somewhat scared about the fact that the skin that is healing over appears very fragile I explained that it does take time for this to really tough enough even once it closes over. Fortunately there is no evidence of infection. Electronic Signature(s) Signed: 04/12/2018 5:03:20 PM By: Worthy Keeler PA-C Entered By: Worthy Keeler on 04/12/2018 93:79:02 Brimley, Maurine [name] ([id number]) -------------------------------------------------------------------------------- Physical Exam Details Patient Name: [name]. Date of Service: 04/12/2018 12:30 PM Medical Record Number: [id number] Patient Account Number: [id number] Date of Birth/Sex: 06/12/1935 (83 y.o. F) Treating RN: Ahmed Prima Primary Care Provider: Harrel Lemon Other Clinician: Referring Provider: Harrel Lemon Treating Provider/Extender: STONE III, HOYT Weeks in Treatment: 1 Constitutional Well-nourished and well-hydrated in no acute distress. Respiratory normal breathing without difficulty. clear to auscultation  bilaterally. Cardiovascular regular rate and rhythm with normal S1, S2. Psychiatric this patient is able to make decisions and demonstrates good insight into disease process. Alert and Oriented x 3. pleasant and cooperative. Notes Patient's wound  shows just three very tiny areas that may still be open although I really feel like only one of three is actually open at this time. It is just a little bit of slough noted on the surface which was sharply debrided with saline and gauze post debridement this appears to be doing much better. No fevers, chills, nausea, or vomiting noted at this time. Electronic Signature(s) Signed: 04/12/2018 5:03:20 PM By: Worthy Keeler PA-C Entered By: Worthy Keeler on 04/12/2018 23:53:61 Stenner, Maurine [name] ([id number]) -------------------------------------------------------------------------------- Physician Orders Details Patient Name: [name] Date of Service: 04/12/2018 12:30 PM Medical Record Number: [id number] Patient Account Number: [id number] Date of Birth/Sex: 01-Jan-1935 (83 y.o. F) Treating RN: Ahmed Prima Primary Care Provider: Harrel Lemon Other Clinician: Referring Provider: Harrel Lemon Treating Provider/Extender: Melburn Hake, HOYT Weeks in Treatment: 1 Verbal / Phone Orders: Yes Clinician: Carolyne Fiscal, [name] Read Back and Verified: Yes Diagnosis Coding ICD-10 Coding Code Description S51.801A Unspecified open wound of right forearm, initial encounter L98.492 Non-pressure chronic ulcer of skin of other sites with fat layer exposed E11.622 Type 2 diabetes mellitus with other skin ulcer I89.0 Lymphedema, not elsewhere classified F17.218 Nicotine dependence, cigarettes, with other nicotine-induced disorders Wound Cleansing Wound #2 Right Forearm o Clean wound with Normal Saline. Anesthetic (add to Medication List) Wound #2 Right Forearm o Topical Lidocaine 4% cream applied to wound bed prior to debridement (In Clinic  Only). Primary Wound Dressing Wound #2 Right Forearm o Xeroform Secondary Dressing Wound #2 Right Forearm o Conform/Kerlix o Non-adherent pad o Other - stretch netting Dressing Change Frequency Wound #2 Right Forearm o Change dressing every day. Follow-up Appointments Wound #2 Right Forearm o Return Appointment in 1 week. Additional Orders / Instructions Wound #2 Right Forearm o Increase protein intake. Patient Medications TANEESHA, EDGIN ([id number]) Allergies: Sulfa (Sulfonamide Antibiotics) Notifications Medication Indication Start End lidocaine DOSE 1 - topical 4 % cream - 1 cream topical Electronic Signature(s) Signed: 04/12/2018 5:03:20 PM By: Worthy Keeler PA-C Signed: 04/13/2018 4:27:21 PM By: Alric Quan Entered By: Alric Quan on 04/12/2018 99:83:38 Rawe, Audray Jenetta Downer ([id number]) -------------------------------------------------------------------------------- Prescription 04/12/2018 Patient Name: [name]. Provider: Worthy Keeler PA-C Date of Birth: June 10, 1935 NPI#: [id number] Sex: F DEA#: [id number] Phone #: [phone] License #: Patient Address: [facility] [address], [place] [address] [address] [place], [place] [address] [phone] Allergies Sulfa (Sulfonamide Antibiotics) Medication Medication: Route: Strength: Form: lidocaine topical 4% cream Class: TOPICAL LOCAL ANESTHETICS Dose: Frequency / Time: Indication: 1 1 cream topical Number of Refills: Number of Units: 0 Generic Substitution: Start Date: End Date: Administered at [place]: Yes Time Administered: Time Discontinued: Note to Pharmacy: Signature(s): Date(s): Electronic Signature(s) Signed: 04/12/2018 5:03:20 PM By: Worthy Keeler PA-C Signed: 04/13/2018 4:27:21 PM By: Alric Quan Entered By: Alric Quan on 04/12/2018  48:18:56 Merry, Jazzmyne Jenetta Downer ([id number]) [place], [place]. ([id number]) --------------------------------------------------------------------------------  Problem List Details Patient Name: Bink, Asheton O. Date of Service: 04/12/2018 12:30 PM Medical Record Number: [id number] Patient Account Number: [id number] Date of Birth/Sex: 1935/10/25 (83 y.o. 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-pressure chronic ulcer of skin of other sites with fat layer 04/04/2018 Yes exposed E11.622 Type 2 diabetes  mellitus with other skin ulcer 04/04/2018 Yes I89.0 Lymphedema, not elsewhere classified 04/04/2018 Yes F17.218 Nicotine dependence, cigarettes, with other nicotine-induced 04/04/2018 Yes disorders Inactive Problems Resolved Problems Electronic Signature(s) Signed: 04/12/2018 5:03:20 PM By: Worthy Keeler PA-C Entered By: Worthy Keeler on 04/12/2018 16:10:96 Meland, Luella Jenetta Downer ([id number]) -------------------------------------------------------------------------------- Progress Note Details Patient Name: Berquist, Kerby O. Date of Service: 04/12/2018 12:30 PM Medical Record Number: [id number] Patient Account Number: [id number] Date of Birth/Sex: 12-Oct-1935 (83 y.o. F) Treating RN: Ahmed Prima Primary Care Provider: Harrel Lemon Other Clinician: Referring Provider: Harrel Lemon Treating Provider/Extender: Melburn Hake, HOYT Weeks in Treatment: 1 Subjective Chief Complaint Information obtained from Patient Right forearm skin tear History of Present Illness (HPI) 83 year old patient who is known to have diabetes mellitus and tobacco abuse has had a injury to the  right posterior calf about a month ago. He has been treated with local care and 2 courses of antibiotics which include Keflex and doxycycline which she has completed. Smokes about a pack and a half of cigarettes a day and has a past medical history of COPD, diabetes mellitus, hyperlipidemia,psoriasis and hypothyroidism. Readmission: 04/04/18 patient presents today for a initial evaluation today concerning a right forearm skin tear which has been present for about two weeks. Currently need this point has been applied to the wound bed followed by a protective dressing that has been secured by wrapping instead of adhesive's. Currently the patient's wound does seem to be doing better patient and her husband states that the excess skin from the skin tear was actually trimmed away prior to starting the wound care they have been performing. With that being said she seems to be doing well there's no evidence of infection which is good news and overall the wound seems to be showing signs of good granulation and overall improvement which is great news. Fortunately she has no with valid bladder dysfunction unexpected fevers chills or weight loss. This seems to be rather straightforward in my opinion in regard to treatment. 04/12/18 on evaluation today patient appears to be doing very well in regard to her right forearm skin tear. She's been tolerating the dressing changes without complication. In general this appears to show signs of great improvement since last time I saw her. Overall I'm very pleased with how things are going patient still somewhat scared about the fact that the skin that is healing over appears very fragile I explained that it does take time for this to really tough enough even once it closes over. Fortunately there is no evidence of infection. Patient History Information obtained from Patient. Family History Heart Disease - Mother, No family history of Cancer, Diabetes, Hereditary  Spherocytosis, Hypertension, Kidney Disease, Lung Disease, Seizures, Stroke, Thyroid Problems, Tuberculosis. Social History Current every day smoker - 1 1.5 a day, Marital Status - Married, Alcohol Use - Never, Drug Use - No History, Caffeine Use - Daily. Medical And Surgical History Notes Cardiovascular hyperlipidemia Review of Systems (ROS) Wilmes, Ezme O. ([id number]) Constitutional Symptoms (General Health) Denies complaints or symptoms of Fever, Chills. Respiratory The patient has no complaints or symptoms. Cardiovascular The patient has no complaints or symptoms. Psychiatric The patient has no complaints or symptoms. Objective Constitutional Well-nourished and well-hydrated in no acute distress. Vitals Time Taken: 12:46 PM, Height: 64 in, Weight: 142 lbs, BMI: 24.4, Temperature: 98.2 F, Pulse: 92 bpm, Respiratory Rate: 16 breaths/min, Blood Pressure: 131/56 mmHg. Respiratory normal breathing without difficulty. clear to auscultation bilaterally. Cardiovascular regular rate and rhythm with normal S1, S2.  Psychiatric this patient is able to make decisions and demonstrates good insight into disease process. Alert and Oriented x 3. pleasant and cooperative. General Notes: Patient's wound shows just three very tiny areas that may still be open although I really feel like only one of three is actually open at this time. It is just a little bit of slough noted on the surface which was sharply debrided with saline and gauze post debridement this appears to be doing much better. No fevers, chills, nausea, or vomiting noted at this time. Integumentary (Hair, Skin) Wound #2 status is Open. Original cause of wound was Trauma. The wound is located on the Right Forearm. The wound measures 0.2cm length x 0.2cm width x 0.1cm depth; 0.031cm^2 area and 0.003cm^3 volume. There is no tunneling or undermining noted. There is a small amount of serous drainage noted. The wound margin is distinct  with the outline attached to the wound base. There is large (67-100%) red granulation within the wound bed. There is no necrotic tissue within the wound bed. Periwound temperature was noted as No Abnormality. The periwound has tenderness on palpation. Assessment Active Problems ICD-10 S51.801A - Unspecified open wound of right forearm, initial encounter L98.492 - Non-pressure chronic ulcer of skin of other sites with fat layer exposed LEVONNE, CARRERAS ([id number]) D78.242 - Type 2 diabetes mellitus with other skin ulcer I89.0 - Lymphedema, not elsewhere classified F17.218 - Nicotine dependence, cigarettes, with other nicotine-induced disorders Plan Wound Cleansing: Wound #2 Right Forearm: Clean wound with Normal Saline. Anesthetic (add to Medication List): Wound #2 Right Forearm: Topical Lidocaine 4% cream applied to wound bed prior to debridement (In Clinic Only). Primary Wound Dressing: Wound #2 Right Forearm: Xeroform Secondary Dressing: Wound #2 Right Forearm: Conform/Kerlix Non-adherent pad Other - stretch netting Dressing Change Frequency: Wound #2 Right Forearm: Change dressing every day. Follow-up Appointments: Wound #2 Right Forearm: Return Appointment in 1 week. Additional Orders / Instructions: Wound #2 Right Forearm: Increase protein intake. The following medication(s) was prescribed: lidocaine topical 4 % cream 1 1 cream topical was prescribed at facility I'm gonna recommend currently we continue with a Xeroform for one more week at that point I think she'll be ready for discharge. You may still need to protect this by keeping a cover for another one-two weeks following that point. She's not happy to hear this due to the fact that she is going crazy having to wear this over her arm but she understands. We will see were things stand at that point. Please see above for specific wound care orders. We will see patient for re-evaluation in 1 week(s) here in the clinic.  If anything worsens or changes patient will contact our office for additional recommendations. Electronic Signature(s) Signed: 04/12/2018 5:03:20 PM By: Worthy Keeler PA-C Entered By: Worthy Keeler on 04/12/2018 35:36:14 Bones, Maurine [name] ([id number]) -------------------------------------------------------------------------------- ROS/PFSH Details Patient Name: [name] Date of Service: 04/12/2018 12:30 PM Medical Record Number: [id number] Patient Account Number: [id number] Date of Birth/Sex: [birth year] (83 y.o. F) Treating RN: Ahmed Prima Primary Care Provider: Harrel Lemon Other Clinician: Referring Provider: Harrel Lemon Treating Provider/Extender: Melburn Hake, HOYT Weeks in Treatment: 1 Information Obtained From Patient Wound History Do you currently have one or more open woundso Yes How many open wounds do you currently haveo 1 Approximately how long have you had your woundso 2 weeks How have you been treating your wound(s) until nowo neosporin Has your wound(s) ever healed and then re-openedo No Have you had any  lab work done in the past montho No Have you tested positive for an antibiotic resistant organism (MRSA, VRE)o No Have you tested positive for osteomyelitis (bone infection)o No Have you had any tests for circulation on your legso No Constitutional Symptoms (General Health) Complaints and Symptoms: Negative for: Fever; Chills Eyes Medical History: Positive for: Cataracts Hematologic/Lymphatic Medical History: Positive for: Anemia Negative for: Hemophilia; Human Immunodeficiency Virus; Lymphedema; Sickle Cell Disease Respiratory Complaints and Symptoms: No Complaints or Symptoms Medical History: Positive for: Chronic Obstructive Pulmonary Disease (COPD) Negative for: Aspiration; Asthma; Pneumothorax; Sleep Apnea; Tuberculosis Cardiovascular Complaints and Symptoms: No Complaints or Symptoms Medical History: Negative for: Angina; Arrhythmia;  Congestive Heart Failure; Coronary Artery Disease; Deep Vein Thrombosis; Hypertension; Hypotension; Myocardial Infarction; Peripheral Arterial Disease; Peripheral Venous Disease; Phlebitis; Vasculitis Past Medical History Notes: hyperlipidemia MIYANNA, WIERSMA ([id number]) Gastrointestinal Medical History: Negative for: Cirrhosis ; Colitis; Crohnos; Hepatitis A; Hepatitis B Endocrine Medical History: Positive for: Type II Diabetes Time with diabetes: 15 years Treated with: Oral agents Blood sugar tested every day: No Oncologic Medical History: Positive for: Received Radiation - 2015 Psychiatric Complaints and Symptoms: No Complaints or Symptoms HBO Extended History Items Eyes: Cataracts Immunizations Pneumococcal Vaccine: Received Pneumococcal Vaccination: Yes Implantable Devices Family and Social History Cancer: No; Diabetes: No; Heart Disease: Yes - Mother; Hereditary Spherocytosis: No; Hypertension: No; Kidney Disease: No; Lung Disease: No; Seizures: No; Stroke: No; Thyroid Problems: No; Tuberculosis: No; Current every day smoker - 1 1.5 a day; Marital Status - Married; Alcohol Use: Never; Drug Use: No History; Caffeine Use: Daily; Financial Concerns: No; Food, Clothing or Shelter Needs: No; Support System Lacking: No; Transportation Concerns: No; Advanced Directives: No; Patient does not want information on Advanced Directives; Do not resuscitate: No; Living Will: Yes (Not Provided); Medical Power of Attorney: No Physician Affirmation I have reviewed and agree with the above information. Electronic Signature(s) Signed: 04/12/2018 5:03:20 PM By: Worthy Keeler PA-C Signed: 04/13/2018 4:27:21 PM By: Alric Quan Entered By: Worthy Keeler on 04/12/2018 25:42:70 [place], Maurine [name] ([id number]) -------------------------------------------------------------------------------- SuperBill Details Patient Name: Cavalieri, Maurine [name] Date of Service: 04/12/2018 Medical Record Number:  [id number] Patient Account Number: [id number] Date of Birth/Sex: [birth year] (83 y.o. F) Treating RN: Ahmed Prima Primary Care Provider: Harrel Lemon Other Clinician: Referring Provider: Harrel Lemon Treating Provider/Extender: Melburn Hake, HOYT Weeks in Treatment: 1 Diagnosis Coding ICD-10 Codes Code Description S51.801A Unspecified open wound of right forearm, initial encounter L98.492 Non-pressure chronic ulcer of skin of other sites with fat layer exposed E11.622 Type 2 diabetes mellitus with other skin ulcer I89.0 Lymphedema, not elsewhere classified F17.218 Nicotine dependence, cigarettes, with other nicotine-induced disorders Facility Procedures CPT4 Code: 71062694 Description: 99213 - WOUND CARE VISIT-LEV 3 EST PT Modifier: Quantity: 1 Physician Procedures CPT4 Code: 8546270 Description: 35009 - WC PHYS LEVEL 3 - EST PT ICD-10 Diagnosis Description S51.801A Unspecified open wound of right forearm, initial encounter L98.492 Non-pressure chronic ulcer of skin of other sites with fat l E11.622 Type 2 diabetes mellitus with  other skin ulcer I89.0 Lymphedema, not elsewhere classified Modifier: ayer exposed Quantity: 1 Electronic Signature(s) Signed: 04/12/2018 2:34:20 PM By: Alric Quan Signed: 04/12/2018 5:03:20 PM By: Worthy Keeler PA-C Entered By: Alric Quan on 04/12/2018 14:34:18

## 2018-04-19 ENCOUNTER — Encounter: Payer: Medicare Other | Attending: Physician Assistant | Admitting: Physician Assistant

## 2018-04-19 DIAGNOSIS — Z8249 Family history of ischemic heart disease and other diseases of the circulatory system: Secondary | ICD-10-CM | POA: Diagnosis not present

## 2018-04-19 DIAGNOSIS — Z7984 Long term (current) use of oral hypoglycemic drugs: Secondary | ICD-10-CM | POA: Diagnosis not present

## 2018-04-19 DIAGNOSIS — Z09 Encounter for follow-up examination after completed treatment for conditions other than malignant neoplasm: Secondary | ICD-10-CM | POA: Insufficient documentation

## 2018-04-19 DIAGNOSIS — L409 Psoriasis, unspecified: Secondary | ICD-10-CM | POA: Insufficient documentation

## 2018-04-19 DIAGNOSIS — E119 Type 2 diabetes mellitus without complications: Secondary | ICD-10-CM | POA: Insufficient documentation

## 2018-04-19 DIAGNOSIS — J449 Chronic obstructive pulmonary disease, unspecified: Secondary | ICD-10-CM | POA: Insufficient documentation

## 2018-04-19 DIAGNOSIS — F17210 Nicotine dependence, cigarettes, uncomplicated: Secondary | ICD-10-CM | POA: Diagnosis not present

## 2018-04-21 NOTE — Progress Notes (Signed)
Deborah, [name] ([id number]) Visit Report for 04/19/2018 Chief Complaint Document Details Patient Name: [name], [name]. Date of Service: 04/19/2018 2:30 PM Medical Record Number: [id number] Patient Account Number: [id number] Date of Birth/Sex: 03/29/1935 (83 y.o. F) Treating RN: Ahmed Prima Primary Care Provider: Harrel Lemon Other Clinician: Referring Provider: Harrel Lemon Treating Provider/Extender: Melburn Hake, HOYT Weeks in Treatment: 2 Information Obtained from: Patient Chief Complaint Right forearm skin tear Electronic Signature(s) Signed: 04/19/2018 5:56:33 PM By: Worthy Keeler PA-C Entered By: Worthy Keeler on 04/19/2018 46:27:03 Woodburn, Candance Jenetta Downer ([id number]) -------------------------------------------------------------------------------- HPI Details Patient Name: [name] Date of Service: 04/19/2018 2:30 PM Medical Record Number: [id number] Patient Account Number: [id number] Date of Birth/Sex: 1935-09-08 (83 y.o. F) Treating RN: Ahmed Prima Primary Care Provider: Harrel Lemon Other Clinician: Referring Provider: Harrel Lemon Treating Provider/Extender: Melburn Hake, HOYT Weeks in Treatment: 2 History of Present Illness HPI Description: 83 year old patient who is known to have diabetes mellitus and tobacco abuse has had a injury to the right posterior calf about a month ago. He has been treated with local care and 2 courses of antibiotics which include Keflex and doxycycline which she has completed. Smokes about a pack and a half of cigarettes a day and has a past medical history of COPD, diabetes mellitus, hyperlipidemia,psoriasis and hypothyroidism. Readmission: 04/04/18 patient presents today for a initial evaluation today concerning a right forearm skin tear which has been present for about two weeks. Currently need this point has been applied to the wound bed followed by a protective dressing that has been secured by wrapping instead of adhesive's.  Currently the patient's wound does seem to be doing better patient and her husband states that the excess skin from the skin tear was actually trimmed away prior to starting the wound care they have been performing. With that being said she seems to be doing well there's no evidence of infection which is good news and overall the wound seems to be showing signs of good granulation and overall improvement which is great news. Fortunately she has no with valid bladder dysfunction unexpected fevers chills or weight loss. This seems to be rather straightforward in my opinion in regard to treatment. 04/12/18 on evaluation today patient appears to be doing very well in regard to her right forearm skin tear. She's been tolerating the dressing changes without complication. In general this appears to show signs of great improvement since last time I saw her. Overall I'm very pleased with how things are going patient still somewhat scared about the fact that the skin that is healing over appears very fragile I explained that it does take time for this to really tough enough even once it closes over. Fortunately there is no evidence of infection. 04/19/18 on evaluation today patient appears to be doing very well in regard to her right forearm ulcer. This actually appears to be completely healed at this point which is good news. There does not appear to be any evidence of infection currently. She still has a little bit of discomfort due to some underlying irritation but overall she is doing excellent. Electronic Signature(s) Signed: 04/19/2018 5:56:33 PM By: Worthy Keeler PA-C Entered By: Worthy Keeler on 04/19/2018 89:38:10 [place], [name] ([id number]) -------------------------------------------------------------------------------- Physical Exam Details Patient Name: [name], [name] Date of Service: 04/19/2018 2:30 PM Medical Record Number: [id number] Patient Account Number: [id number] Date of Birth/Sex:  08/15/1935 (83 y.o. F) Treating RN: Ahmed Prima Primary Care Provider: Harrel Lemon Other Clinician: Referring Provider: Edwina Barth  JOHN Treating Provider/Extender: STONE III, HOYT Weeks in Treatment: 2 Constitutional Well-nourished and well-hydrated in no acute distress. Respiratory normal breathing without difficulty. Psychiatric this patient is able to make decisions and demonstrates good insight into disease process. Alert and Oriented x 3. pleasant and cooperative. Notes Patient's wound appears to be completely healed over and this is doing excellent. She has no significant discomfort which is good news. There's no evidence of infection. Electronic Signature(s) Signed: 04/19/2018 5:56:33 PM By: Worthy Keeler PA-C Entered By: Worthy Keeler on 04/19/2018 16:96:78 Makanda, [name] ([id number]) -------------------------------------------------------------------------------- Physician Orders Details Patient Name: [name] Date of Service: 04/19/2018 2:30 PM Medical Record Number: [id number] Patient Account Number: [id number] Date of Birth/Sex: Nov 21, 1935 (83 y.o. F) Treating RN: Ahmed Prima Primary Care Provider: Harrel Lemon Other Clinician: Referring Provider: Harrel Lemon Treating Provider/Extender: Melburn Hake, HOYT Weeks in Treatment: 2 Verbal / Phone Orders: Yes ClinicianCarolyne Fiscal, [name] Read Back and Verified: Yes Diagnosis Coding ICD-10 Coding Code Description S51.801A Unspecified open wound of right forearm, initial encounter L98.492 Non-pressure chronic ulcer of skin of other sites with fat layer exposed E11.622 Type 2 diabetes mellitus with other skin ulcer I89.0 Lymphedema, not elsewhere classified F17.218 Nicotine dependence, cigarettes, with other nicotine-induced disorders Discharge From [facility] o Discharge from [place] area clean and dry. Keep moisturized. Please call our office if you have any questions or  concerns. Electronic Signature(s) Signed: 04/19/2018 5:11:14 PM By: Alric Quan Signed: 04/19/2018 5:56:33 PM By: Worthy Keeler PA-C Entered By: Alric Quan on 04/19/2018 24:23:53 [place]nek, [name] ([id number]) -------------------------------------------------------------------------------- Problem List Details Patient Name: KYM, SCANNELL. Date of Service: 04/19/2018 2:30 PM Medical Record Number: [id number] Patient Account Number: [id number] Date of Birth/Sex: 11/10/1935 (83 y.o. F) Treating RN: Ahmed Prima Primary Care Provider: Harrel Lemon Other Clinician: Referring Provider: Harrel Lemon Treating Provider/Extender: Melburn Hake, HOYT Weeks in Treatment: 2 Active Problems ICD-10 Impacting Encounter Code Description Active Date Wound Healing Diagnosis S51.801A Unspecified open wound of right forearm, initial encounter 04/04/2018 Yes L98.492 Non-pressure chronic ulcer of skin of other sites with fat layer 04/04/2018 Yes exposed E11.622 Type 2 diabetes mellitus with other skin ulcer 04/04/2018 Yes I89.0 Lymphedema, not elsewhere classified 04/04/2018 Yes F17.218 Nicotine dependence, cigarettes, with other nicotine-induced 04/04/2018 Yes disorders Inactive Problems Resolved Problems Electronic Signature(s) Signed: 04/19/2018 5:56:33 PM By: Worthy Keeler PA-C Entered By: Worthy Keeler on 04/19/2018 93:26:71 Darrah, Donica Jenetta Downer ([id number]) -------------------------------------------------------------------------------- Progress Note Details Patient Name: Devito, [name]. Date of Service: 04/19/2018 2:30 PM Medical Record Number: [id number] Patient Account Number: [id number] Date of Birth/Sex: [birth year] (83 y.o. F) Treating RN: Ahmed Prima Primary Care Provider: Harrel Lemon Other Clinician: Referring Provider: Harrel Lemon Treating Provider/Extender: Melburn Hake, HOYT Weeks in Treatment: 2 Subjective Chief Complaint Information obtained from Patient Right forearm  skin tear History of Present Illness (HPI) 83 year old patient who is known to have diabetes mellitus and tobacco abuse has had a injury to the right posterior calf about a month ago. He has been treated with local care and 2 courses of antibiotics which include Keflex and doxycycline which she has completed. Smokes about a pack and a half of cigarettes a day and has a past medical history of COPD, diabetes mellitus, hyperlipidemia,psoriasis and hypothyroidism. Readmission: 04/04/18 patient presents today for a initial evaluation today concerning a right forearm skin tear which has been present for about two weeks. Currently need this point has been applied to the wound bed followed by a protective  dressing that has been secured by wrapping instead of adhesive's. Currently the patient's wound does seem to be doing better patient and her husband states that the excess skin from the skin tear was actually trimmed away prior to starting the wound care they have been performing. With that being said she seems to be doing well there's no evidence of infection which is good news and overall the wound seems to be showing signs of good granulation and overall improvement which is great news. Fortunately she has no with valid bladder dysfunction unexpected fevers chills or weight loss. This seems to be rather straightforward in my opinion in regard to treatment. 04/12/18 on evaluation today patient appears to be doing very well in regard to her right forearm skin tear. She's been tolerating the dressing changes without complication. In general this appears to show signs of great improvement since last time I saw her. Overall I'm very pleased with how things are going patient still somewhat scared about the fact that the skin that is healing over appears very fragile I explained that it does take time for this to really tough enough even once it closes over. Fortunately there is no evidence of  infection. 04/19/18 on evaluation today patient appears to be doing very well in regard to her right forearm ulcer. This actually appears to be completely healed at this point which is good news. There does not appear to be any evidence of infection currently. She still has a little bit of discomfort due to some underlying irritation but overall she is doing excellent. Patient History Information obtained from Patient. Family History Heart Disease - Mother, No family history of Cancer, Diabetes, Hereditary Spherocytosis, Hypertension, Kidney Disease, Lung Disease, Seizures, Stroke, Thyroid Problems, Tuberculosis. Social History Current every day smoker - 1 1.5 a day, Marital Status - Married, Alcohol Use - Never, Drug Use - No History, Caffeine Use - Daily. Medical And Surgical History Notes CHARMELLE, SOH ([id number]) Cardiovascular hyperlipidemia Review of Systems (ROS) Constitutional Symptoms (General Health) Denies complaints or symptoms of Fever, Chills. Respiratory The patient has no complaints or symptoms. Cardiovascular The patient has no complaints or symptoms. Psychiatric The patient has no complaints or symptoms. Objective Constitutional Well-nourished and well-hydrated in no acute distress. Vitals Time Taken: 2:52 PM, Height: 64 in, Weight: 142 lbs, BMI: 24.4, Temperature: 98.2 F, Pulse: 103 bpm, Respiratory Rate: 16 breaths/min, Blood Pressure: 140/75 mmHg. Respiratory normal breathing without difficulty. Psychiatric this patient is able to make decisions and demonstrates good insight into disease process. Alert and Oriented x 3. pleasant and cooperative. General Notes: Patient's wound appears to be completely healed over and this is doing excellent. She has no significant discomfort which is good news. There's no evidence of infection. Integumentary (Hair, Skin) Wound #2 status is Open. Original cause of wound was Trauma. The wound is located on the Right  Forearm. The wound measures 0cm length x 0cm width x 0cm depth; 0cm^2 area and 0cm^3 volume. Assessment Active Problems ICD-10 S51.801A - Unspecified open wound of right forearm, initial encounter L98.492 - Non-pressure chronic ulcer of skin of other sites with fat layer exposed E11.622 - Type 2 diabetes mellitus with other skin ulcer I89.0 - Lymphedema, not elsewhere classified F17.218 - Nicotine dependence, cigarettes, with other nicotine-induced disorders CYLAH, FANNIN ([id number]) Plan Discharge From [facility] Services: Discharge from [place] area clean and dry. Keep moisturized. Please call our office if you have any questions or concerns. I'm gonna recommend currently that  we continue with protecting the area and we did give her a cloth tube sleeve to use over the area just for protection over the next couple of weeks. She can also use a stretch netting if she prefers that. Mainly I just wanted have something in order to not allow this to become irritated or inadvertently reopen. She's in agreement with plan. We will see her in the future as needed otherwise will discontinue wound care services at this point. Electronic Signature(s) Signed: 04/19/2018 5:56:33 PM By: Worthy Keeler PA-C Entered By: Worthy Keeler on 04/19/2018 85:02:77 Buczkowski, [name] ([id number]) -------------------------------------------------------------------------------- ROS/PFSH Details Patient Name: [name] Date of Service: 04/19/2018 2:30 PM Medical Record Number: [id number] Patient Account Number: [id number] Date of Birth/Sex: 05/16/1935 (83 y.o. F) Treating RN: Ahmed Prima Primary Care Provider: Harrel Lemon Other Clinician: Referring Provider: Harrel Lemon Treating Provider/Extender: Melburn Hake, HOYT Weeks in Treatment: 2 Information Obtained From Patient Wound History Do you currently have one or more open woundso Yes How many open wounds do you currently haveo  1 Approximately how long have you had your woundso 2 weeks How have you been treating your wound(s) until nowo neosporin Has your wound(s) ever healed and then re-openedo No Have you had any lab work done in the past montho No Have you tested positive for an antibiotic resistant organism (MRSA, VRE)o No Have you tested positive for osteomyelitis (bone infection)o No Have you had any tests for circulation on your legso No Constitutional Symptoms (General Health) Complaints and Symptoms: Negative for: Fever; Chills Eyes Medical History: Positive for: Cataracts Hematologic/Lymphatic Medical History: Positive for: Anemia Negative for: Hemophilia; Human Immunodeficiency Virus; Lymphedema; Sickle Cell Disease Respiratory Complaints and Symptoms: No Complaints or Symptoms Medical History: Positive for: Chronic Obstructive Pulmonary Disease (COPD) Negative for: Aspiration; Asthma; Pneumothorax; Sleep Apnea; Tuberculosis Cardiovascular Complaints and Symptoms: No Complaints or Symptoms Medical History: Negative for: Angina; Arrhythmia; Congestive Heart Failure; Coronary Artery Disease; Deep Vein Thrombosis; Hypertension; Hypotension; Myocardial Infarction; Peripheral Arterial Disease; Peripheral Venous Disease; Phlebitis; Vasculitis Past Medical History Notes: hyperlipidemia BRADIE, SANGIOVANNI ([id number]) Gastrointestinal Medical History: Negative for: Cirrhosis ; Colitis; Crohnos; Hepatitis A; Hepatitis B Endocrine Medical History: Positive for: Type II Diabetes Time with diabetes: 15 years Treated with: Oral agents Blood sugar tested every day: No Oncologic Medical History: Positive for: Received Radiation - 2015 Psychiatric Complaints and Symptoms: No Complaints or Symptoms HBO Extended History Items Eyes: Cataracts Immunizations Pneumococcal Vaccine: Received Pneumococcal Vaccination: Yes Implantable Devices Family and Social History Cancer: No; Diabetes: No; Heart  Disease: Yes - Mother; Hereditary Spherocytosis: No; Hypertension: No; Kidney Disease: No; Lung Disease: No; Seizures: No; Stroke: No; Thyroid Problems: No; Tuberculosis: No; Current every day smoker - 1 1.5 a day; Marital Status - Married; Alcohol Use: Never; Drug Use: No History; Caffeine Use: Daily; Financial Concerns: No; Food, Clothing or Shelter Needs: No; Support System Lacking: No; Transportation Concerns: No; Advanced Directives: No; Patient does not want information on Advanced Directives; Do not resuscitate: No; Living Will: Yes (Not Provided); Medical Power of Attorney: No Physician Affirmation I have reviewed and agree with the above information. Electronic Signature(s) Signed: 04/19/2018 5:11:14 PM By: Alric Quan Signed: 04/19/2018 5:56:33 PM By: Worthy Keeler PA-C Entered By: Worthy Keeler on 04/19/2018 65:46:50 Sorrels, [name] ([id number]) -------------------------------------------------------------------------------- SuperBill Details Patient Name: [name] Date of Service: 04/19/2018 Medical Record Number: [id number] Patient Account Number: [id number] Date of Birth/Sex: 04/25/1935 (83 y.o. F) Treating RN: Ahmed Prima Primary Care Provider: Edwina Barth,  JOHN Other Clinician: Referring Provider: Harrel Lemon Treating Provider/Extender: Melburn Hake, HOYT Weeks in Treatment: 2 Diagnosis Coding ICD-10 Codes Code Description S51.801A Unspecified open wound of right forearm, initial encounter L98.492 Non-pressure chronic ulcer of skin of other sites with fat layer exposed E11.622 Type 2 diabetes mellitus with other skin ulcer I89.0 Lymphedema, not elsewhere classified F17.218 Nicotine dependence, cigarettes, with other nicotine-induced disorders Facility Procedures CPT4 Code: [id number] Description: [phone] - WOUND CARE VISIT-LEV 2 EST PT Modifier: Quantity: 1 Physician Procedures CPT4 Code: [id number] Description: 68088 - WC PHYS LEVEL 3 - EST PT ICD-10  Diagnosis Description S51.801A Unspecified open wound of right forearm, initial encounter L98.492 Non-pressure chronic ulcer of skin of other sites with fat l E11.622 Type 2 diabetes mellitus with  other skin ulcer I89.0 Lymphedema, not elsewhere classified Modifier: ayer exposed Quantity: 1 Electronic Signature(s) Signed: 04/19/2018 3:26:08 PM By: Alric Quan Signed: 04/19/2018 5:56:33 PM By: Worthy Keeler PA-C Entered By: Alric Quan on 04/19/2018 15:26:07

## 2018-04-23 NOTE — Progress Notes (Signed)
[name] ([id number]) Visit Report for 04/19/2018 Arrival Information Details Patient Name: [name], [name]. Date of Service: 04/19/2018 2:30 PM Medical Record Number: [id number] Patient Account Number: [id number] Date of Birth/Sex: 29-Aug-1935 (83 y.o. F) Treating RN: [name] Primary Care [name]: [name] Other Clinician: Referring [name]: [name] Treating [name]/Extender: [name], [name]: 2 Visit Information History Since Last Visit Added or deleted any medications: No Patient Arrived: Ambulatory Any new allergies or adverse reactions: No Arrival Time: 14:50 Had a fall or experienced change in No Accompanied By: self activities of daily living that may affect Transfer Assistance: None risk of falls: Patient Identification Verified: Yes Signs or symptoms of abuse/neglect since last visito No Secondary Verification Process Completed: Yes Hospitalized since last visit: No Patient Has Alerts: Yes Implantable device outside of the clinic excluding No Patient Alerts: DMII cellular tissue based products placed in the center since last visit: Has Dressing in Place as Prescribed: Yes Pain Present Now: No Electronic Signature(s) Signed: 04/20/2018 5:47:31 PM By: [name], BSN, RN, CWS, Kim RN, BSN Entered By: [name], BSN, RN, CWS, [name] on 04/19/2018 02:63:78 [name], [name] ([id number]) -------------------------------------------------------------------------------- Clinic Level of Care Assessment Details Patient Name: [name], [name]. Date of Service: 04/19/2018 2:30 PM Medical Record Number: [id number] Patient Account Number: [id number] Date of Birth/Sex: [birth year] (83 y.o. F) Treating RN: [name] Primary Care [name]: [name] Other Clinician: Referring Kal Chait: [name] Treating [name]/Extender: [name], [name]: 2 Clinic Level of Care Assessment Items TOOL 4 Quantity Score X - Use when only an  EandM is performed on FOLLOW-UP visit 1 0 ASSESSMENTS - Nursing Assessment / Reassessment X - Reassessment of Co-morbidities (includes updates in patient status) 1 10 X- 1 5 Reassessment of Adherence to [name] Plan ASSESSMENTS - Wound and Skin Assessment / Reassessment X - Simple Wound Assessment / Reassessment - one wound 1 5 []  - 0 Complex Wound Assessment / Reassessment - multiple wounds []  - 0 Dermatologic / Skin Assessment (not related to wound area) ASSESSMENTS - Focused Assessment []  - Circumferential Edema Measurements - multi extremities 0 []  - 0 Nutritional Assessment / Counseling / Intervention []  - 0 Lower Extremity Assessment (monofilament, tuning fork, pulses) []  - 0 Peripheral Arterial Disease Assessment (using hand held doppler) ASSESSMENTS - Ostomy and/or Continence Assessment and Care []  - Incontinence Assessment and Management 0 []  - 0 Ostomy Care Assessment and Management (repouching, etc.) PROCESS - Coordination of Care X - Simple Patient / Family Education for ongoing care 1 15 []  - 0 Complex (extensive) Patient / Family Education for ongoing care []  - 0 Staff obtains Programmer, systems, Records, Test Results / Process Orders []  - 0 Staff telephones HHA, Nursing Homes / Clarify orders / etc []  - 0 Routine Transfer to another Facility (non-emergent condition) []  - 0 Routine Hospital Admission (non-emergent condition) []  - 0 New Admissions / Biomedical engineer / Ordering NPWT, Apligraf, etc. []  - 0 Emergency Hospital Admission (emergent condition) X- 1 10 Simple Discharge Coordination [name], NAVARRETTE. ([id number]) []  - 0 Complex (extensive) Discharge Coordination PROCESS - Special Needs []  - Pediatric / Minor Patient Management 0 []  - 0 Isolation Patient Management []  - 0 Hearing / Language / Visual special needs []  - 0 Assessment of Community assistance (transportation, D/C planning, etc.) []  - 0 Additional assistance / Altered mentation []  -  0 Support Surface(s) Assessment (bed, cushion, seat, etc.) INTERVENTIONS - Wound Cleansing / Measurement X - Simple Wound Cleansing - one wound  1 5 []  - 0 Complex Wound Cleansing - multiple wounds X- 1 5 Wound Imaging (photographs - any number of wounds) []  - 0 Wound Tracing (instead of photographs) []  - 0 Simple Wound Measurement - one wound []  - 0 Complex Wound Measurement - multiple wounds INTERVENTIONS - Wound Dressings []  - Small Wound Dressing one or multiple wounds 0 []  - 0 Medium Wound Dressing one or multiple wounds []  - 0 Large Wound Dressing one or multiple wounds []  - 0 Application of Medications - topical []  - 0 Application of Medications - injection INTERVENTIONS - Miscellaneous []  - External ear exam 0 []  - 0 Specimen Collection (cultures, biopsies, blood, body fluids, etc.) []  - 0 Specimen(s) / Culture(s) sent or taken to Lab for analysis []  - 0 Patient Transfer (multiple staff / Civil Service fast streamer / Similar devices) []  - 0 Simple Staple / Suture removal (25 or less) []  - 0 Complex Staple / Suture removal (26 or more) []  - 0 Hypo / Hyperglycemic Management (close monitor of Blood Glucose) []  - 0 Ankle / Brachial Index (ABI) - do not check if billed separately X- 1 5 Vital Signs [name], [name] ([id number]) Has the patient been seen at the hospital within the last three years: Yes Total Score: 60 Level Of Care: New/Established - Level 2 Electronic Signature(s) Signed: 04/19/2018 5:11:14 PM By: [name] Entered By: [name] on 04/19/2018 65:99:35 [name], [name] ([id number]) -------------------------------------------------------------------------------- Encounter Discharge Information Details Patient Name: Deborah Hope. Date of Service: 04/19/2018 2:30 PM Medical Record Number: [id number] Patient Account Number: [id number] Date of Birth/Sex: Dec 12, 1935 (83 y.o. F) Treating RN: [name] Primary Care Tayven Renteria: [name] Other  Clinician: Referring Travers Goodley: [name] Treating Attilio Zeitler/Extender: [name], [name]: 2 Encounter Discharge Information Items Discharge Condition: Stable Ambulatory Status: Ambulatory Discharge Destination: Home Transportation: Private Auto Accompanied By: self Schedule Follow-up Appointment: No Clinical Summary of Care: Electronic Signature(s) Signed: 04/19/2018 5:11:14 PM By: [name] Entered By: [name] on 04/19/2018 76:22:63 [name], [name] ([id number]) -------------------------------------------------------------------------------- Lower Extremity Assessment Details Patient Name: Clinger, [name]. Date of Service: 04/19/2018 2:30 PM Medical Record Number: [id number] Patient Account Number: [id number] Date of Birth/Sex: [birth year] (83 y.o. F) Treating RN: [name] Primary Care Dasan Hardman: [name] Other Clinician: Referring Bobi Daudelin: [name] Treating Porchea Charrier/Extender: Sharalyn Ink in [name]: 2 Electronic Signature(s) Signed: 04/20/2018 5:47:31 PM By: [name], BSN, RN, CWS, Kim RN, BSN Entered By: [name], BSN, RN, CWS, [name] on 04/19/2018 76:81:15 [name], [name] ([id number]) -------------------------------------------------------------------------------- Multi Wound Chart Details Patient Name: Deborah Hope. Date of Service: 04/19/2018 2:30 PM Medical Record Number: [id number] Patient Account Number: [id number] Date of Birth/Sex: 05-23-1935 (83 y.o. F) Treating RN: [name] Primary Care Kamryn Messineo: [name] Other Clinician: Referring Solyana Nonaka: [name] Treating Novis League/Extender: [name], [name]: 2 Vital Signs Height(in): 64 Pulse(bpm): 103 Weight(lbs): 142 Blood Pressure(mmHg): 140/75 Body Mass Index(BMI): 24 Temperature(F): 98.2 Respiratory Rate 16 (breaths/min): Photos: [2:No Photos] [N/A:N/A] Wound Location: [2:Right Forearm] [N/A:N/A] Wounding Event: [2:Trauma]  [N/A:N/A] Primary Etiology: [2:Trauma, Other] [N/A:N/A] Date Acquired: [2:03/22/2018] [N/A:N/A] Weeks of [name]: [2:2] [N/A:N/A] Wound Status: [2:Open] [N/A:N/A] Measurements L x W x D [2:0x0x0] [N/A:N/A] (cm) Area (cm) : [2:0] [N/A:N/A] Volume (cm) : [2:0] [N/A:N/A] % Reduction in Area: [2:100.00%] [N/A:N/A] % Reduction in Volume: [2:100.00%] [N/A:N/A] Classification: [2:Full Thickness Without Exposed Support Structures] [N/A:N/A] Periwound Skin Texture: [2:No Abnormalities Noted] [N/A:N/A] Periwound Skin Moisture: [2:No Abnormalities Noted] [N/A:N/A] Periwound Skin Color: [2:No Abnormalities Noted No] [  N/A:N/A N/A] [name] Notes Electronic Signature(s) Signed: 04/19/2018 5:11:14 PM By: [name] Entered By: [name] on 04/19/2018 89:38:10 [name], [name] ([id number]) -------------------------------------------------------------------------------- Multi-Disciplinary Care Plan Details Patient Name: [name], ROA. Date of Service: 04/19/2018 2:30 PM Medical Record Number: [id number] Patient Account Number: [id number] Date of Birth/Sex: 02/08/1935 (83 y.o. F) Treating RN: [name] Primary Care Kwadwo Taras: [name] Other Clinician: Referring Zabian Swayne: [name] Treating Paiten Boies/Extender: [name], [name]: 2 Active Inactive Electronic Signature(s) Signed: 04/19/2018 5:11:14 PM By: [name] Entered By: [name] on 04/19/2018 36:14:43 [name], [name] ([id number]) -------------------------------------------------------------------------------- Pain Assessment Details Patient Name: [name], [name]. Date of Service: 04/19/2018 2:30 PM Medical Record Number: [id number] Patient Account Number: [id number] Date of Birth/Sex: 1935/07/24 (83 y.o. F) Treating RN: [name] Primary Care Sadi Arave: [name] Other Clinician: Referring Carlo Guevarra: [name] Treating Adal Sereno/Extender: [name], [name]:  2 Active Problems Location of Pain Severity and Description of Pain Patient Has Paino No Site Locations Pain Management and Medication Current Pain Management: Goals for Pain Management Topical or injectable lidocaine is offered to patient for acute pain when surgical debridement is performed. If needed, Patient is instructed to use over the counter pain medication for the following 24-48 hours after debridement. Wound care MDs do not prescribed pain medications. Patient has chronic pain or uncontrolled pain. Patient has been instructed to make an appointment with their Primary Care Physician for pain management. Electronic Signature(s) Signed: 04/20/2018 5:47:31 PM By: [name], BSN, RN, CWS, Kim RN, BSN Entered By: [name], BSN, RN, CWS, [name] on 04/19/2018 12:45:80 Loss, [name] ([id number]) -------------------------------------------------------------------------------- Patient/Caregiver Education Details Patient Name: ELIANYS, CONRY. Date of Service: 04/19/2018 2:30 PM Medical Record Number: [id number] Patient Account Number: [id number] Date of Birth/Gender: 04-Dec-1935 (83 y.o. F) Treating RN: [name] Primary Care Physician: [name] Other Clinician: Referring Physician: Harrel [name] Treating Physician/Extender: Sharalyn Ink in [name]: 2 Education Assessment Education Provided To: Patient Education Topics Provided Wound/Skin Impairment: Handouts: Other: Please call our office if you have any questions or concerns. Methods: Explain/Verbal Responses: State content correctly Electronic Signature(s) Signed: 04/19/2018 5:11:14 PM By: [name] Entered By: [name] on 04/19/2018 93:79:02 Wilkin, Dubois. ([id number]) -------------------------------------------------------------------------------- Wound Assessment Details Patient Name: Rew, Christasia O. Date of Service: 04/19/2018 2:30 PM Medical Record Number: [id number] Patient Account Number:  [id number] Date of Birth/Sex: 16-Sep-1935 (83 y.o. F) Treating RN: [name] Primary Care Jawann Urbani: [name] Other Clinician: Referring Keigan Tafoya: [name] Treating Ladarrion Telfair/Extender: [name], [name]: 2 Wound Status Wound Number: 2 Primary Etiology: Trauma, Other Wound Location: Right Forearm Wound Status: Open Wounding Event: Trauma Date Acquired: 03/22/2018 Weeks Of [name]: 2 Clustered Wound: No Wound Measurements Length: (cm) Width: (cm) Depth: (cm) Area: (cm) Volume: (cm) 0 % Reduction in Area: 100% 0 % Reduction in Volume: 100% 0 0 0 Wound Description Full Thickness Without Exposed Support Classification: Structures Periwound Skin Texture Texture Color No Abnormalities Noted: No No Abnormalities Noted: No Moisture No Abnormalities Noted: No Electronic Signature(s) Signed: 04/20/2018 5:47:31 PM By: [name], BSN, RN, CWS, Kim RN, BSN Entered By: [name], BSN, RN, CWS, [name] on 04/19/2018 96:22:29 Depinto, [name] ([id number]) -------------------------------------------------------------------------------- Vitals Details Patient Name: Deborah Hope Date of Service: 04/19/2018 2:30 PM Medical Record Number: [id number] Patient Account Number: [id number] Date of Birth/Sex: [birth year] (83 y.o. F) Treating RN: [name] Primary Care Roiza Wiedel: [name] Other Clinician: Referring Mercedees Convery: [name] Treating Ona Roehrs/Extender: [name], [name]: 2  Vital Signs Time Taken: 14:52 Temperature (F): 98.2 Height (in): 64 Pulse (bpm): 103 Weight (lbs): 142 Respiratory Rate (breaths/min): 16 Body Mass Index (BMI): 24.4 Blood Pressure (mmHg): 140/75 Reference Range: 80 - 120 mg / dl Electronic Signature(s) Signed: 04/20/2018 5:47:31 PM By: [name], BSN, RN, CWS, Kim RN, BSN Entered By: [name], BSN, RN, CWS, [name] on 04/19/2018 14:53:15

## 2018-11-24 ENCOUNTER — Encounter: Payer: Self-pay | Admitting: Emergency Medicine

## 2018-11-24 ENCOUNTER — Emergency Department: Payer: Medicare Other

## 2018-11-24 ENCOUNTER — Inpatient Hospital Stay
Admission: EM | Admit: 2018-11-24 | Discharge: 2018-11-27 | DRG: 872 | Disposition: A | Discharge status: Home / Self Care | Payer: Medicare Other | Attending: Internal Medicine | Admitting: Internal Medicine

## 2018-11-24 DIAGNOSIS — E872 Acidosis: Secondary | ICD-10-CM | POA: Diagnosis present

## 2018-11-24 DIAGNOSIS — A419 Sepsis, unspecified organism: Secondary | ICD-10-CM | POA: Diagnosis not present

## 2018-11-24 DIAGNOSIS — F419 Anxiety disorder, unspecified: Secondary | ICD-10-CM | POA: Diagnosis present

## 2018-11-24 DIAGNOSIS — E119 Type 2 diabetes mellitus without complications: Secondary | ICD-10-CM | POA: Diagnosis present

## 2018-11-24 DIAGNOSIS — Z79899 Other long term (current) drug therapy: Secondary | ICD-10-CM

## 2018-11-24 DIAGNOSIS — F17210 Nicotine dependence, cigarettes, uncomplicated: Secondary | ICD-10-CM | POA: Diagnosis present

## 2018-11-24 DIAGNOSIS — M810 Age-related osteoporosis without current pathological fracture: Secondary | ICD-10-CM | POA: Diagnosis present

## 2018-11-24 DIAGNOSIS — E871 Hypo-osmolality and hyponatremia: Secondary | ICD-10-CM | POA: Diagnosis present

## 2018-11-24 DIAGNOSIS — J440 Chronic obstructive pulmonary disease with acute lower respiratory infection: Secondary | ICD-10-CM | POA: Diagnosis present

## 2018-11-24 DIAGNOSIS — A4151 Sepsis due to Escherichia coli [E. coli]: Principal | ICD-10-CM | POA: Diagnosis present

## 2018-11-24 DIAGNOSIS — K5730 Diverticulosis of large intestine without perforation or abscess without bleeding: Secondary | ICD-10-CM | POA: Diagnosis present

## 2018-11-24 DIAGNOSIS — E785 Hyperlipidemia, unspecified: Secondary | ICD-10-CM | POA: Diagnosis present

## 2018-11-24 DIAGNOSIS — Z7982 Long term (current) use of aspirin: Secondary | ICD-10-CM

## 2018-11-24 DIAGNOSIS — Z853 Personal history of malignant neoplasm of breast: Secondary | ICD-10-CM

## 2018-11-24 DIAGNOSIS — E039 Hypothyroidism, unspecified: Secondary | ICD-10-CM | POA: Diagnosis present

## 2018-11-24 DIAGNOSIS — Z7984 Long term (current) use of oral hypoglycemic drugs: Secondary | ICD-10-CM

## 2018-11-24 DIAGNOSIS — N390 Urinary tract infection, site not specified: Secondary | ICD-10-CM | POA: Diagnosis present

## 2018-11-24 DIAGNOSIS — Z79890 Hormone replacement therapy (postmenopausal): Secondary | ICD-10-CM

## 2018-11-24 DIAGNOSIS — B9620 Unspecified Escherichia coli [E. coli] as the cause of diseases classified elsewhere: Secondary | ICD-10-CM | POA: Diagnosis present

## 2018-11-24 DIAGNOSIS — J209 Acute bronchitis, unspecified: Secondary | ICD-10-CM | POA: Diagnosis present

## 2018-11-24 DIAGNOSIS — N12 Tubulo-interstitial nephritis, not specified as acute or chronic: Secondary | ICD-10-CM | POA: Diagnosis present

## 2018-11-24 DIAGNOSIS — E440 Moderate protein-calorie malnutrition: Secondary | ICD-10-CM | POA: Diagnosis present

## 2018-11-24 DIAGNOSIS — E876 Hypokalemia: Secondary | ICD-10-CM | POA: Diagnosis present

## 2018-11-24 MED ORDER — VANCOMYCIN HCL IN DEXTROSE 1-5 GM/200ML-% IV SOLN
1000.0000 mg | Freq: Once | INTRAVENOUS | Status: AC
  Administered 2018-11-25: 1000 mg via INTRAVENOUS
  Filled 2018-11-24: qty 200

## 2018-11-24 MED ORDER — SODIUM CHLORIDE 0.9 % IV SOLN
2.0000 g | Freq: Once | INTRAVENOUS | Status: AC
  Administered 2018-11-24: 2 g via INTRAVENOUS
  Filled 2018-11-24: qty 2

## 2018-11-24 MED ORDER — METRONIDAZOLE IN NACL 5-0.79 MG/ML-% IV SOLN
500.0000 mg | Freq: Three times a day (TID) | INTRAVENOUS | Status: DC
  Administered 2018-11-25 (×2): 500 mg via INTRAVENOUS
  Filled 2018-11-24 (×4): qty 100

## 2018-11-24 NOTE — ED Triage Notes (Signed)
Pt arrived via EMS from home with reports that pt has been on 2 rounds of antibiotic in last 2 weeks with no improvement. Pt to ED tonight with SOB, rales on ascultation, fever. Pt is A&O x4. MD at bedside.

## 2018-11-25 ENCOUNTER — Other Ambulatory Visit: Payer: Self-pay

## 2018-11-25 ENCOUNTER — Inpatient Hospital Stay: Payer: Medicare Other

## 2018-11-25 DIAGNOSIS — M810 Age-related osteoporosis without current pathological fracture: Secondary | ICD-10-CM | POA: Diagnosis present

## 2018-11-25 DIAGNOSIS — Z853 Personal history of malignant neoplasm of breast: Secondary | ICD-10-CM | POA: Diagnosis not present

## 2018-11-25 DIAGNOSIS — E876 Hypokalemia: Secondary | ICD-10-CM | POA: Diagnosis present

## 2018-11-25 DIAGNOSIS — E119 Type 2 diabetes mellitus without complications: Secondary | ICD-10-CM | POA: Diagnosis present

## 2018-11-25 DIAGNOSIS — E039 Hypothyroidism, unspecified: Secondary | ICD-10-CM | POA: Diagnosis present

## 2018-11-25 DIAGNOSIS — A419 Sepsis, unspecified organism: Secondary | ICD-10-CM | POA: Diagnosis present

## 2018-11-25 DIAGNOSIS — E785 Hyperlipidemia, unspecified: Secondary | ICD-10-CM | POA: Diagnosis present

## 2018-11-25 DIAGNOSIS — E871 Hypo-osmolality and hyponatremia: Secondary | ICD-10-CM | POA: Diagnosis present

## 2018-11-25 DIAGNOSIS — J209 Acute bronchitis, unspecified: Secondary | ICD-10-CM | POA: Diagnosis present

## 2018-11-25 DIAGNOSIS — F419 Anxiety disorder, unspecified: Secondary | ICD-10-CM | POA: Diagnosis present

## 2018-11-25 DIAGNOSIS — B9620 Unspecified Escherichia coli [E. coli] as the cause of diseases classified elsewhere: Secondary | ICD-10-CM | POA: Diagnosis present

## 2018-11-25 DIAGNOSIS — Z79890 Hormone replacement therapy (postmenopausal): Secondary | ICD-10-CM | POA: Diagnosis not present

## 2018-11-25 DIAGNOSIS — N12 Tubulo-interstitial nephritis, not specified as acute or chronic: Secondary | ICD-10-CM | POA: Diagnosis present

## 2018-11-25 DIAGNOSIS — J449 Chronic obstructive pulmonary disease, unspecified: Secondary | ICD-10-CM | POA: Insufficient documentation

## 2018-11-25 DIAGNOSIS — Z7982 Long term (current) use of aspirin: Secondary | ICD-10-CM | POA: Diagnosis not present

## 2018-11-25 DIAGNOSIS — Z7984 Long term (current) use of oral hypoglycemic drugs: Secondary | ICD-10-CM | POA: Diagnosis not present

## 2018-11-25 DIAGNOSIS — E872 Acidosis: Secondary | ICD-10-CM | POA: Diagnosis present

## 2018-11-25 DIAGNOSIS — N390 Urinary tract infection, site not specified: Secondary | ICD-10-CM | POA: Diagnosis present

## 2018-11-25 DIAGNOSIS — E440 Moderate protein-calorie malnutrition: Secondary | ICD-10-CM | POA: Diagnosis present

## 2018-11-25 DIAGNOSIS — A4151 Sepsis due to Escherichia coli [E. coli]: Secondary | ICD-10-CM | POA: Diagnosis present

## 2018-11-25 DIAGNOSIS — F17210 Nicotine dependence, cigarettes, uncomplicated: Secondary | ICD-10-CM | POA: Diagnosis present

## 2018-11-25 DIAGNOSIS — Z79899 Other long term (current) drug therapy: Secondary | ICD-10-CM | POA: Diagnosis not present

## 2018-11-25 DIAGNOSIS — K5730 Diverticulosis of large intestine without perforation or abscess without bleeding: Secondary | ICD-10-CM | POA: Diagnosis present

## 2018-11-25 DIAGNOSIS — J440 Chronic obstructive pulmonary disease with acute lower respiratory infection: Secondary | ICD-10-CM

## 2018-11-25 LAB — COMPREHENSIVE METABOLIC PANEL
ALT: 16 U/L (ref 0–44)
ANION GAP: 13 (ref 5–15)
AST: 29 U/L (ref 15–41)
Albumin: 3.4 g/dL — ABNORMAL LOW (ref 3.5–5.0)
Alkaline Phosphatase: 88 U/L (ref 38–126)
BILIRUBIN TOTAL: 0.9 mg/dL (ref 0.3–1.2)
BUN: 13 mg/dL (ref 8–23)
CO2: 19 mmol/L — ABNORMAL LOW (ref 22–32)
Calcium: 8.6 mg/dL — ABNORMAL LOW (ref 8.9–10.3)
Chloride: 99 mmol/L (ref 98–111)
Creatinine, Ser: 0.73 mg/dL (ref 0.44–1.00)
Glucose, Bld: 119 mg/dL — ABNORMAL HIGH (ref 70–99)
Potassium: 3.7 mmol/L (ref 3.5–5.1)
SODIUM: 131 mmol/L — AB (ref 135–145)
TOTAL PROTEIN: 6.5 g/dL (ref 6.5–8.1)

## 2018-11-25 LAB — CBC
HCT: 38.1 % (ref 36.0–46.0)
Hemoglobin: 12.4 g/dL (ref 12.0–15.0)
MCH: 29.5 pg (ref 26.0–34.0)
MCHC: 32.5 g/dL (ref 30.0–36.0)
MCV: 90.5 fL (ref 80.0–100.0)
Platelets: 169 10*3/uL (ref 150–400)
RBC: 4.21 MIL/uL (ref 3.87–5.11)
RDW: 13 % (ref 11.5–15.5)
WBC: 20.2 10*3/uL — ABNORMAL HIGH (ref 4.0–10.5)
nRBC: 0 % (ref 0.0–0.2)

## 2018-11-25 LAB — URINALYSIS, COMPLETE (UACMP) WITH MICROSCOPIC
Bilirubin Urine: NEGATIVE
Glucose, UA: NEGATIVE mg/dL
HGB URINE DIPSTICK: NEGATIVE
Ketones, ur: 20 mg/dL — AB
NITRITE: NEGATIVE
PH: 5 (ref 5.0–8.0)
Protein, ur: 30 mg/dL — AB
Specific Gravity, Urine: 1.016 (ref 1.005–1.030)

## 2018-11-25 LAB — BASIC METABOLIC PANEL
Anion gap: 8 (ref 5–15)
BUN: 11 mg/dL (ref 8–23)
CO2: 22 mmol/L (ref 22–32)
Calcium: 7.9 mg/dL — ABNORMAL LOW (ref 8.9–10.3)
Chloride: 103 mmol/L (ref 98–111)
Creatinine, Ser: 0.61 mg/dL (ref 0.44–1.00)
GFR calc Af Amer: >60 mL/min (ref >60)
GFR calc non Af Amer: >60 mL/min (ref >60)
Glucose, Bld: 160 mg/dL — ABNORMAL HIGH (ref 70–99)
Potassium: 3.7 mmol/L (ref 3.5–5.1)
Sodium: 133 mmol/L — ABNORMAL LOW (ref 135–145)

## 2018-11-25 LAB — CBC WITH DIFFERENTIAL/PLATELET
Abs Immature Granulocytes: 0.04 10*3/uL (ref 0.00–0.07)
Basophils Absolute: 0.1 10*3/uL (ref 0.0–0.1)
Basophils Relative: 1 %
EOS PCT: 0 %
Eosinophils Absolute: 0 10*3/uL (ref 0.0–0.5)
HCT: 43.1 % (ref 36.0–46.0)
HEMOGLOBIN: 14.2 g/dL (ref 12.0–15.0)
IMMATURE GRANULOCYTES: 0 %
Lymphocytes Relative: 4 %
Lymphs Abs: 0.4 10*3/uL — ABNORMAL LOW (ref 0.7–4.0)
MCH: 29.8 pg (ref 26.0–34.0)
MCHC: 32.9 g/dL (ref 30.0–36.0)
MCV: 90.5 fL (ref 80.0–100.0)
Monocytes Absolute: 0.3 10*3/uL (ref 0.1–1.0)
Monocytes Relative: 3 %
NEUTROS ABS: 9.3 10*3/uL — AB (ref 1.7–7.7)
NRBC: 0 % (ref 0.0–0.2)
Neutrophils Relative %: 92 %
PLATELETS: 182 10*3/uL (ref 150–400)
RBC: 4.76 MIL/uL (ref 3.87–5.11)
RDW: 13 % (ref 11.5–15.5)
WBC: 10 10*3/uL (ref 4.0–10.5)

## 2018-11-25 LAB — INFLUENZA PANEL BY PCR (TYPE A & B)
Influenza A By PCR: NEGATIVE
Influenza B By PCR: NEGATIVE

## 2018-11-25 LAB — BLOOD CULTURE ID PANEL (REFLEXED)
Acinetobacter baumannii: NOT DETECTED
CANDIDA TROPICALIS: NOT DETECTED
Candida albicans: NOT DETECTED
Candida glabrata: NOT DETECTED
Candida krusei: NOT DETECTED
Candida parapsilosis: NOT DETECTED
Carbapenem resistance: NOT DETECTED
Enterobacter cloacae complex: NOT DETECTED
Enterobacteriaceae species: DETECTED — AB
Enterococcus species: NOT DETECTED
Escherichia coli: DETECTED — AB
Haemophilus influenzae: NOT DETECTED
Klebsiella oxytoca: NOT DETECTED
Klebsiella pneumoniae: NOT DETECTED
Listeria monocytogenes: NOT DETECTED
Neisseria meningitidis: NOT DETECTED
Proteus species: NOT DETECTED
Pseudomonas aeruginosa: NOT DETECTED
Serratia marcescens: NOT DETECTED
Staphylococcus aureus (BCID): NOT DETECTED
Staphylococcus species: NOT DETECTED
Streptococcus agalactiae: NOT DETECTED
Streptococcus pneumoniae: NOT DETECTED
Streptococcus pyogenes: NOT DETECTED
Streptococcus species: NOT DETECTED

## 2018-11-25 LAB — CG4 I-STAT (LACTIC ACID)
Lactic Acid, Venous: 1.49 mmol/L (ref 0.5–1.9)
Lactic Acid, Venous: 4.62 mmol/L (ref 0.5–1.9)

## 2018-11-25 LAB — GLUCOSE, CAPILLARY
Glucose-Capillary: 123 mg/dL — ABNORMAL HIGH (ref 70–99)
Glucose-Capillary: 125 mg/dL — ABNORMAL HIGH (ref 70–99)
Glucose-Capillary: 126 mg/dL — ABNORMAL HIGH (ref 70–99)
Glucose-Capillary: 142 mg/dL — ABNORMAL HIGH (ref 70–99)

## 2018-11-25 LAB — LACTIC ACID, PLASMA
Lactic Acid, Venous: 1.5 mmol/L (ref 0.5–1.9)
Lactic Acid, Venous: 1.1 mmol/L (ref 0.5–1.9)

## 2018-11-25 LAB — PROTIME-INR
INR: 1.16
Prothrombin Time: 14.7 seconds (ref 11.4–15.2)

## 2018-11-25 MED ORDER — GLUCERNA SHAKE PO LIQD
237.0000 mL | Freq: Two times a day (BID) | ORAL | Status: DC
  Administered 2018-11-25 – 2018-11-27 (×4): 237 mL via ORAL

## 2018-11-25 MED ORDER — ONDANSETRON HCL 4 MG PO TABS: 4.0000 mg | ORAL_TABLET | Freq: Four times a day (QID) | ORAL | Status: DC | PRN

## 2018-11-25 MED ORDER — SODIUM CHLORIDE 0.9 % IV SOLN
INTRAVENOUS | Status: DC
  Administered 2018-11-25: 19:00:00 via INTRAVENOUS

## 2018-11-25 MED ORDER — CLONAZEPAM 0.5 MG PO TABS
0.5000 mg | ORAL_TABLET | Freq: Two times a day (BID) | ORAL | Status: DC | PRN
  Administered 2018-11-25 – 2018-11-26 (×2): 0.5 mg via ORAL
  Filled 2018-11-25 (×2): qty 1

## 2018-11-25 MED ORDER — SODIUM CHLORIDE 0.9 % IV SOLN
2.0000 g | Freq: Two times a day (BID) | INTRAVENOUS | Status: DC
  Filled 2018-11-25 (×2): qty 2

## 2018-11-25 MED ORDER — ENOXAPARIN SODIUM 40 MG/0.4ML ~~LOC~~ SOLN
40.0000 mg | SUBCUTANEOUS | Status: DC
  Administered 2018-11-25 – 2018-11-26 (×2): 40 mg via SUBCUTANEOUS
  Filled 2018-11-25 (×2): qty 0.4

## 2018-11-25 MED ORDER — ACETAMINOPHEN 325 MG PO TABS
650.0000 mg | ORAL_TABLET | Freq: Four times a day (QID) | ORAL | Status: DC | PRN
  Administered 2018-11-25: 650 mg via ORAL
  Filled 2018-11-25: qty 2

## 2018-11-25 MED ORDER — LEVOTHYROXINE SODIUM 137 MCG PO TABS
137.0000 ug | ORAL_TABLET | Freq: Every day | ORAL | Status: DC
  Administered 2018-11-25 – 2018-11-27 (×3): 137 ug via ORAL
  Filled 2018-11-25 (×3): qty 1

## 2018-11-25 MED ORDER — ASPIRIN 81 MG PO CHEW
81.0000 mg | CHEWABLE_TABLET | Freq: Every day | ORAL | Status: DC
  Administered 2018-11-25 – 2018-11-27 (×3): 81 mg via ORAL
  Filled 2018-11-25 (×3): qty 1

## 2018-11-25 MED ORDER — INSULIN ASPART 100 UNIT/ML ~~LOC~~ SOLN
0.0000 [IU] | Freq: Every day | SUBCUTANEOUS | Status: DC
  Administered 2018-11-26: 2 [IU] via SUBCUTANEOUS

## 2018-11-25 MED ORDER — INSULIN ASPART 100 UNIT/ML ~~LOC~~ SOLN
0.0000 [IU] | Freq: Three times a day (TID) | SUBCUTANEOUS | Status: DC
  Administered 2018-11-26: 1 [IU] via SUBCUTANEOUS
  Administered 2018-11-26 – 2018-11-27 (×2): 2 [IU] via SUBCUTANEOUS
  Filled 2018-11-25 (×3): qty 1

## 2018-11-25 MED ORDER — SODIUM CHLORIDE 0.9 % IV SOLN
1.0000 g | Freq: Two times a day (BID) | INTRAVENOUS | Status: DC
  Administered 2018-11-25 – 2018-11-27 (×5): 1 g via INTRAVENOUS
  Filled 2018-11-25 (×6): qty 1

## 2018-11-25 MED ORDER — SIMVASTATIN 20 MG PO TABS
40.0000 mg | ORAL_TABLET | Freq: Every day | ORAL | Status: DC
  Administered 2018-11-25 – 2018-11-26 (×2): 40 mg via ORAL
  Filled 2018-11-25: qty 2
  Filled 2018-11-25: qty 4
  Filled 2018-11-25 (×2): qty 2

## 2018-11-25 MED ORDER — ONDANSETRON HCL 4 MG/2ML IJ SOLN: 4.0000 mg | Freq: Four times a day (QID) | INTRAMUSCULAR | Status: DC | PRN

## 2018-11-25 MED ORDER — PANTOPRAZOLE SODIUM 40 MG PO TBEC
40.0000 mg | DELAYED_RELEASE_TABLET | Freq: Every day | ORAL | Status: DC
  Administered 2018-11-25 – 2018-11-27 (×3): 40 mg via ORAL
  Filled 2018-11-25 (×3): qty 1

## 2018-11-25 MED ORDER — ADULT MULTIVITAMIN W/MINERALS CH
1.0000 | ORAL_TABLET | Freq: Every day | ORAL | Status: DC
  Administered 2018-11-25 – 2018-11-27 (×3): 1 via ORAL
  Filled 2018-11-25 (×3): qty 1

## 2018-11-25 MED ORDER — VANCOMYCIN HCL IN DEXTROSE 1-5 GM/200ML-% IV SOLN
1000.0000 mg | INTRAVENOUS | Status: DC
  Administered 2018-11-25: 1000 mg via INTRAVENOUS
  Filled 2018-11-25: qty 200

## 2018-11-25 MED ORDER — SODIUM CHLORIDE 0.9 % IV SOLN
INTRAVENOUS | Status: AC
  Administered 2018-11-25: 04:00:00 via INTRAVENOUS

## 2018-11-25 MED ORDER — ACETAMINOPHEN 650 MG RE SUPP: 650.0000 mg | Freq: Four times a day (QID) | RECTAL | Status: DC | PRN

## 2018-11-25 NOTE — Progress Notes (Signed)
Advanced Care Plan.  Purpose of Encounter: CODE STATUS. Parties in Attendance: The patient, her husband and me. Patient's Decisional Capacity: Yes. Medical Story: [name]  is a 83 y.o. female  with history of breast cancer, COPD, hypertension, diabetes, hyperlipidemia, hypothyroidism etc.  The patient is admitted for sepsis due to pyelonephritis and bacteremia.  I discussed with the patient about her current condition, prognosis and CODE STATUS.  The patient want to be resuscitated and intubated if she has cardiopulmonary arrest. Plan:  Code Status: Full code. Time spent discussing advance care planning: 17 minutes.

## 2018-11-25 NOTE — Progress Notes (Addendum)
[facility] [place] NAME: [name]    MR#:  [id number]  DATE OF BIRTH:  10-Mar-1935  SUBJECTIVE:  CHIEF COMPLAINT:   Chief Complaint  Patient presents with  . Fever   Fever and dysuria. REVIEW OF SYSTEMS:  Review of Systems  Constitutional: Positive for fever and malaise/fatigue. Negative for chills.  HENT: Negative for sore throat.   Eyes: Negative for blurred vision and double vision.  Respiratory: Negative for cough, hemoptysis, shortness of breath, wheezing and stridor.   Cardiovascular: Negative for chest pain, palpitations, orthopnea and leg swelling.  Gastrointestinal: Negative for abdominal pain, blood in stool, diarrhea, melena, nausea and vomiting.  Genitourinary: Positive for dysuria and frequency. Negative for flank pain, hematuria and urgency.  Musculoskeletal: Negative for back pain and joint pain.  Skin: Negative for rash.  Neurological: Negative for dizziness, sensory change, focal weakness, seizures, loss of consciousness, weakness and headaches.  Endo/Heme/Allergies: Negative for polydipsia.  Psychiatric/Behavioral: Negative for depression. The patient is not nervous/anxious.     DRUG ALLERGIES:   Allergies  Allergen Reactions  . Codeine Diarrhea and Nausea And Vomiting  . Septra [Sulfamethoxazole-Trimethoprim] Swelling  . Sulfa Antibiotics Swelling   VITALS:  Blood pressure (!) 124/57, pulse 86, temperature 98.7 F (37.1 C), temperature source Oral, resp. rate 20, height 5\' 4"  (1.626 m), weight 64.2 kg, SpO2 94 %. PHYSICAL EXAMINATION:  Physical Exam Constitutional:      General: She is not in acute distress. HENT:     Head: Normocephalic.     Mouth/Throat:     Mouth: Mucous membranes are moist.  Eyes:     General: No scleral icterus.    Conjunctiva/sclera: Conjunctivae normal.     Pupils: Pupils are equal, round, and reactive to light.  Neck:     Musculoskeletal: Normal range of motion and neck  supple.     Vascular: No JVD.     Trachea: No tracheal deviation.  Cardiovascular:     Rate and Rhythm: Normal rate and regular rhythm.     Heart sounds: Normal heart sounds. No murmur. No gallop.   Pulmonary:     Effort: Pulmonary effort is normal. No respiratory distress.     Breath sounds: Normal breath sounds. No wheezing or rales.  Abdominal:     General: Bowel sounds are normal. There is no distension.     Palpations: Abdomen is soft.     Tenderness: There is no abdominal tenderness. There is no rebound.  Musculoskeletal: Normal range of motion.        General: No tenderness.  Skin:    Findings: No erythema or rash.  Neurological:     Mental Status: She is alert and oriented to person, place, and time.     Cranial Nerves: No cranial nerve deficit.  Psychiatric:        Mood and Affect: Mood normal.    LABORATORY PANEL:  Female CBC Recent Labs  Lab 11/25/18 0530  WBC 20.2*  HGB 12.4  HCT 38.1  PLT 169   ------------------------------------------------------------------------------------------------------------------ Chemistries  Recent Labs  Lab 11/24/18 2344 11/25/18 0530  NA 131* 133*  K 3.7 3.7  CL 99 103  CO2 19* 22  GLUCOSE 119* 160*  BUN 13 11  CREATININE 0.73 0.61  CALCIUM 8.6* 7.9*  AST 29  --   ALT 16  --   ALKPHOS 88  --   BILITOT 0.9  --    RADIOLOGY:  Dg Chest Portable  1 View  Result Date: 11/25/2018 CLINICAL DATA:  83 year old female with sepsis. EXAM: PORTABLE CHEST 1 VIEW COMPARISON:  None. FINDINGS: Mild diffuse interstitial coarsening and chronic bronchitic changes. No focal consolidation, pleural effusion, pneumothorax. The cardiac silhouette is within normal limits. No acute osseous pathology. IMPRESSION: No active disease. Electronically Signed   By: Anner Crete M.D.   On: 11/25/2018 00:15   Ct Renal Stone Study  Result Date: 11/25/2018 CLINICAL DATA:  Left flank pain. Urinary tract infection. Leukocytosis, fever, and chills.  EXAM: CT ABDOMEN AND PELVIS WITHOUT CONTRAST TECHNIQUE: Multidetector CT imaging of the abdomen and pelvis was performed following the standard protocol without IV contrast. COMPARISON:  01/27/2016 FINDINGS: Lower chest: No acute findings. Hepatobiliary: No mass visualized on this unenhanced exam. Prior cholecystectomy. No evidence of biliary obstruction. Pancreas: No mass or inflammatory process visualized on this unenhanced exam. Spleen:  Within normal limits in size. Adrenals/Urinary tract: No evidence of urolithiasis or hydronephrosis. Mild asymmetric left perinephric stranding is seen, which may be due to pyelonephritis. Unremarkable unopacified urinary bladder. Stomach/Bowel: No evidence of obstruction, inflammatory process, or abnormal fluid collections. Diverticulosis is seen mainly involving the descending and sigmoid colon, however there is no evidence of diverticulitis. Vascular/Lymphatic: No pathologically enlarged lymph nodes identified. 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 No evidence of abdominal aortic aneurysm. Aortic atherosclerosis. Reproductive:  No mass or other significant abnormality. Other:  None. Musculoskeletal:  No suspicious bone lesions identified. IMPRESSION: Mild asymmetric left perinephric stranding, suspicious for pyelonephritis. No evidence of ureteral calculi or hydronephrosis. Colonic diverticulosis, without radiographic evidence of diverticulitis. Electronically Signed   By: Earle Gell M.D.   On: 11/25/2018 17:03   ASSESSMENT AND PLAN:     Sepsis due to UTI, pyelonephritis and bacteremia Cont meropenem pending ucx and bcx results. Follow-up CBC and cultures. Check CT stone  Protocol to eval for nephrolithiasis or pyelo per Dr. Ola Spurr. CT abd: Mild asymmetric left perinephric stranding, suspicious for pyelonephritis. No evidence of ureteral calculi or hydronephrosis.  Colonic diverticulosis, without radiographic evidence  of Diverticulitis.  Hyponatremia.  Normal saline IV, follow-up BMP. Lactic acidosis.  Improved with treatment.   COPD -continue home meds   Diabetes ([place]) -sliding scale insulin coverage   Anxiety -continue home dose anxiolytic   HLD (hyperlipidemia) -continue home dose antilipid   Hypothyroidism -continue home dose thyroid replacement  I discussed with Dr. Ola Spurr. All the records are reviewed and case discussed with Care Management/Social Worker. Management plans discussed with the patient, her husband and they are in agreement.  CODE STATUS: Full Code  TOTAL TIME TAKING CARE OF THIS PATIENT: 27 minutes.   More than 50% of the time was spent in counseling/coordination of care: YES  POSSIBLE D/C IN 3 DAYS, DEPENDING ON CLINICAL CONDITION.   Demetrios Loll M.D on 11/25/2018 at 6:19 PM  Between 7am to 6pm - Pager - [phone]  After 6pm go to [email or web address] - [facility]

## 2018-11-25 NOTE — H&P (Signed)
[facility] [place] NAME: [name]    MR#:  [id number]  DATE OF BIRTH:  December 28, 1934  DATE OF ADMISSION:  11/24/2018  PRIMARY CARE PHYSICIAN: Baxter Hire, MD   REQUESTING/REFERRING PHYSICIAN: Owens Shark, MD  CHIEF COMPLAINT:   Chief Complaint  Patient presents with  . Fever    HISTORY OF PRESENT ILLNESS:  [name]  is a 83 y.o. female who presents with chief complaint as above.  Patient presents to the ED with complaint of fever at home, chills, malaise.  Patient states that she was being treated in the outpatient setting for UTI.  She states that she took a course of Macrobid, but did not feel a significant improvement in her symptoms.  She then developed intermittent fevers over the past 36 hours, with intermittent episodes of chills.  She finally came to ED for evaluation and was found here to meet sepsis criteria, with persistent UTI.  Hospitalist were called for admission  PAST MEDICAL HISTORY:   Past Medical History:  Diagnosis Date  . Breast cancer ([place]) 2016   Right- radiation  . COPD (chronic obstructive pulmonary disease) ([place])   . Diabetes mellitus without complication ([place])   . Hyperlipidemia   . Hypothyroidism   . Osteoporosis   . Psoriasis   . Substance abuse ([place])    tobacco  . Thyroid disease   . Vertigo    x1, several yrs ago  . Wears dentures    partial upper     PAST SURGICAL HISTORY:   Past Surgical History:  Procedure Laterality Date  . APPENDECTOMY    . BREAST BIOPSY Right 2002   neg  . BREAST EXCISIONAL BIOPSY Right 02/05/2015   lumpectomy + radation  . CATARACT EXTRACTION W/PHACO Left 06/28/2017   Procedure: CATARACT EXTRACTION PHACO AND INTRAOCULAR LENS PLACEMENT (IOC)  Left Diabetic;  Surgeon: Eulogio Bear, MD;  Location: [facility];  Service: Ophthalmology;  Laterality: Left;  Diabetic - oral meds  . CATARACT EXTRACTION W/PHACO Right 08/02/2017   Procedure:  CATARACT EXTRACTION PHACO AND INTRAOCULAR LENS PLACEMENT (IOC)RIGHT DIABETIC;  Surgeon: Eulogio Bear, MD;  Location: [facility];  Service: Ophthalmology;  Laterality: Right;  DIABETIC WANTS TO BE AFTER 8  . CESAREAN SECTION    . CHOLECYSTECTOMY    . COLONOSCOPY WITH PROPOFOL N/A 03/05/2016   Procedure: COLONOSCOPY WITH PROPOFOL;  Surgeon: Manya Silvas, MD;  Location[facility] ENDOSCOPY;  Service: Endoscopy;  Laterality: N/A;  . ESOPHAGOGASTRODUODENOSCOPY (EGD) WITH PROPOFOL N/A 03/05/2016   Procedure: ESOPHAGOGASTRODUODENOSCOPY (EGD) WITH PROPOFOL;  Surgeon: Manya Silvas, MD;  Location: [facility] ENDOSCOPY;  Service: Endoscopy;  Laterality: N/A;  . TONSILLECTOMY       SOCIAL HISTORY:   Social History   Tobacco Use  . Smoking status: Current Every Day Smoker    Packs/day: 1.00    Years: 50.00    Pack years: 50.00  . Smokeless tobacco: Never Used  Substance Use Topics  . Alcohol use: No     FAMILY HISTORY:  Family history reviewed and is non-contributory   DRUG ALLERGIES:   Allergies  Allergen Reactions  . Codeine Diarrhea and Nausea And Vomiting  . Septra [Sulfamethoxazole-Trimethoprim] Swelling  . Sulfa Antibiotics Swelling    MEDICATIONS AT HOME:   Prior to Admission medications   Medication Sig Start Date End Date Taking? Authorizing Provider  Apremilast 30 MG TABS Take 1 tablet by mouth daily.    [provider]  Ascorbic Acid (VITAMIN C PO) Take 500 mg by mouth daily.    [provider]  aspirin 81 MG tablet Take 81 mg by mouth daily.    [provider]  clobetasol (TEMOVATE) 0.05 % GEL Apply topically 2 (two) times daily.    [provider]  clonazePAM (KLONOPIN) 0.5 MG tablet Take 0.5 mg by mouth 2 (two) times daily as needed for anxiety.    [provider]  diphenhydramine-acetaminophen (TYLENOL PM) 25-500 MG TABS tablet Take 1 tablet by mouth at bedtime as needed.    [provider]  Ferrous  Sulfate (SLOW FE PO) Take 1 tablet by mouth daily.    [provider]  glipiZIDE (GLUCOTROL) 10 MG tablet Take 10 mg by mouth 2 (two) times daily before a meal.    [provider]  letrozole (FEMARA) 2.5 MG tablet Take 2.5 mg by mouth daily.    [provider]  levothyroxine (SYNTHROID, LEVOTHROID) 137 MCG tablet Take 137 mcg by mouth daily before breakfast.    [provider]  LORazepam (ATIVAN) 0.5 MG tablet Take 0.5 mg by mouth every 8 (eight) hours.    [provider]  metFORMIN (GLUCOPHAGE) 1000 MG tablet Take 1,000 mg by mouth 2 (two) times daily with a meal.    [provider]  Multiple Vitamins-Minerals (CENTRUM SILVER PO) Take by mouth daily.    [provider]  Multiple Vitamins-Minerals (ZINC PO) Take 50 mg by mouth daily.    [provider]  omeprazole (PRILOSEC) 20 MG capsule Take 20 mg by mouth daily.    [provider]  simvastatin (ZOCOR) 40 MG tablet Take 40 mg by mouth daily.    [provider]    REVIEW OF SYSTEMS:  Review of Systems  Constitutional: Positive for chills and fever. Negative for malaise/fatigue and weight loss.  HENT: Negative for ear pain, hearing loss and tinnitus.   Eyes: Negative for blurred vision, double vision, pain and redness.  Respiratory: Negative for cough, hemoptysis and shortness of breath.   Cardiovascular: Negative for chest pain, palpitations, orthopnea and leg swelling.  Gastrointestinal: Positive for abdominal pain and nausea. Negative for constipation, diarrhea and vomiting.  Genitourinary: Positive for dysuria. Negative for frequency and hematuria.  Musculoskeletal: Negative for back pain, joint pain and neck pain.  Skin:       No acne, rash, or lesions  Neurological: Negative for dizziness, tremors, focal weakness and weakness.  Endo/Heme/Allergies: Negative for polydipsia. Does not bruise/bleed easily.  Psychiatric/Behavioral: Negative for  depression. The patient is not nervous/anxious and does not have insomnia.      VITAL SIGNS:   Vitals:   11/24/18 2343 11/25/18 0000 11/25/18 0030 11/25/18 0100  BP:  (!) 130/56 136/78 (!) 130/58  Pulse: (!) 111 (!) 123 (!) 117 (!) 117  Resp: (!) 21 (!) 30 (!) 29   Temp: 98.3 F (36.8 C)     TempSrc: Oral     SpO2: 96% 94% 95% 94%   Wt Readings from Last 3 Encounters:  08/02/17 64.4 kg  06/28/17 64 kg  03/05/16 63.5 kg    PHYSICAL EXAMINATION:  Physical Exam  Vitals reviewed. Constitutional: She is oriented to person, place, and time. She appears well-developed and well-nourished. No distress.  HENT:  Head: Normocephalic and atraumatic.  Dry mucous membranes  Eyes: Pupils are equal, round, and reactive to light. Conjunctivae and EOM are normal. No scleral icterus.  Neck: Normal range of motion. Neck supple. No JVD  present. No thyromegaly present.  Cardiovascular: Regular rhythm and intact distal pulses. Exam reveals no gallop and no friction rub.  No murmur heard. Tachycardic  Respiratory: Effort normal and breath sounds normal. No respiratory distress. She has no wheezes. She has no rales.  GI: Soft. Bowel sounds are normal. She exhibits no distension. There is abdominal tenderness.  Musculoskeletal: Normal range of motion.        General: No edema.     Comments: No arthritis, no gout  Lymphadenopathy:    She has no cervical adenopathy.  Neurological: She is alert and oriented to person, place, and time. No cranial nerve deficit.  No dysarthria, no aphasia  Skin: Skin is warm and dry. No rash noted. No erythema.  Psychiatric: She has a normal mood and affect. Her behavior is normal. Judgment and thought content normal.    LABORATORY PANEL:   CBC Recent Labs  Lab 11/24/18 2344  WBC 10.0  HGB 14.2  HCT 43.1  PLT 182   ------------------------------------------------------------------------------------------------------------------  Chemistries  Recent Labs   Lab 11/24/18 2344  NA 131*  K 3.7  CL 99  CO2 19*  GLUCOSE 119*  BUN 13  CREATININE 0.73  CALCIUM 8.6*  AST 29  ALT 16  ALKPHOS 88  BILITOT 0.9   ------------------------------------------------------------------------------------------------------------------  Cardiac Enzymes No results for input(s): TROPONINI in the last 168 hours. ------------------------------------------------------------------------------------------------------------------  RADIOLOGY:  Dg Chest Portable 1 View  Result Date: 11/25/2018 CLINICAL DATA:  83 year old female with sepsis. EXAM: PORTABLE CHEST 1 VIEW COMPARISON:  None. FINDINGS: Mild diffuse interstitial coarsening and chronic bronchitic changes. No focal consolidation, pleural effusion, pneumothorax. The cardiac silhouette is within normal limits. No acute osseous pathology. IMPRESSION: No active disease. Electronically Signed   By: Anner Crete M.D.   On: 11/25/2018 00:15    EKG:   Orders placed or performed during the hospital encounter of 11/24/18  . ED EKG 12-Lead  . ED EKG 12-Lead    IMPRESSION AND PLAN:  Principal Problem:   Sepsis ([place]) -IV antibiotics given, lactic acid was elevated around 4, aggressive IV fluids initiated and will trend lactate until within normal limits, blood pressure stable, source is urinary tract infection, cultures sent Active Problems:   UTI (urinary tract infection) -IV antibiotics and culture as above   COPD -continue home meds   Diabetes ([facility]) -sliding scale insulin coverage   Anxiety -continue home dose anxiolytic   HLD (hyperlipidemia) -continue home dose antilipid   Hypothyroidism -continue home dose thyroid replacement  Chart review performed and case discussed with ED provider. Labs, imaging and/or ECG reviewed by provider and discussed with patient/family. Management plans discussed with the patient and/or family.  DVT PROPHYLAXIS: SubQ lovenox   GI PROPHYLAXIS:  None  ADMISSION  STATUS: Inpatient     CODE STATUS: Full  TOTAL TIME TAKING CARE OF THIS PATIENT: 45 minutes.   Jazaria Jarecki FIELDING 11/25/2018, 1:30 AM  [facility]  Office  [phone]  CC: Primary care physician; Baxter Hire, MD  Note:  This document was prepared using Dragon voice recognition software and may include unintentional dictation errors.

## 2018-11-25 NOTE — Progress Notes (Signed)
Pharmacy Antibiotic Note  [name] is a 83 y.o. female admitted on 11/24/2018 with unknown source.  Pharmacy has been consulted for vancomycin and cefepime dosing.  Plan: DW 61kg  VD 43L kei 0.044 hr-1  T1/2 16 hours Vancomycin 1 gram q 24 hours ordered with stacked dosing. Level before 5th dose. Goal trough 15-20.  Cefepime 2 grams q 12 hours ordered.  Height: 5\' 4"  (162.6 cm) Weight: 135 lb (61.2 kg) IBW/kg (Calculated) : 54.7  Temp (24hrs), Avg:98.3 F (36.8 C), Min:98.3 F (36.8 C), Max:98.3 F (36.8 C)  Recent Labs  Lab 11/24/18 2344 11/24/18 2350  WBC 10.0  --   CREATININE 0.73  --   LATICACIDVEN  --  4.62*    Estimated Creatinine Clearance: 47.6 mL/min (by C-G formula based on SCr of 0.73 mg/dL).    Allergies  Allergen Reactions  . Codeine Diarrhea and Nausea And Vomiting  . Septra [Sulfamethoxazole-Trimethoprim] Swelling  . Sulfa Antibiotics Swelling    Antimicrobials this admission: Vancomycin, cefepime 12/13  >>    >>   Dose adjustments this admission:   Microbiology results: 12/12 BCx: pending 12/13 UCx: pending       12/12 CXR: no active disease 12/13 UAL LE(+) NO2(-)  WBC 21-50  Thank you for allowing pharmacy to be a part of this patient's care.  Mariem Skolnick S 11/25/2018 1:42 AM

## 2018-11-25 NOTE — Plan of Care (Signed)
  Problem: Fluid Volume: Goal: Hemodynamic stability will improve Outcome: Progressing   Problem: Clinical Measurements: Goal: Diagnostic test results will improve Outcome: Progressing Goal: Signs and symptoms of infection will decrease Outcome: Progressing   Problem: Respiratory: Goal: Ability to maintain adequate ventilation will improve Outcome: Progressing   Problem: Health Behavior/Discharge Planning: Goal: Ability to manage health-related needs will improve Outcome: Progressing   Problem: Clinical Measurements: Goal: Ability to maintain clinical measurements within normal limits will improve Outcome: Progressing Goal: Will remain free from infection Outcome: Progressing Goal: Diagnostic test results will improve Outcome: Progressing Goal: Cardiovascular complication will be avoided Outcome: Progressing   Problem: Activity: Goal: Risk for activity intolerance will decrease Outcome: Progressing   Problem: Nutrition: Goal: Adequate nutrition will be maintained Outcome: Progressing   Problem: Coping: Goal: Level of anxiety will decrease Outcome: Progressing   Problem: Elimination: Goal: Will not experience complications related to bowel motility Outcome: Progressing

## 2018-11-25 NOTE — Progress Notes (Signed)
Initial Nutrition Assessment  DOCUMENTATION CODES:   Non-severe (moderate) malnutrition in context of chronic illness  INTERVENTION:   - Glucerna Shake po BID, each supplement provides 220 kcal and 10 grams of protein  - MVI with minerals daily  - Provided education regarding importance of adequate kcal and protein intake in maintaining lean muscle mass  - Liberalize diet from Heart Healthy/Carb Mod to just Carb Mod  - Encourage adequate PO intake  NUTRITION DIAGNOSIS:   Moderate Malnutrition related to chronic illness (COPD) as evidenced by mild fat depletion, mild muscle depletion, moderate muscle depletion.  GOAL:   Patient will meet greater than or equal to 90% of their needs  MONITOR:   PO intake, Supplement acceptance, Weight trends, I & O's, Labs  REASON FOR ASSESSMENT:   Malnutrition Screening Tool    ASSESSMENT:   83 year old female who presented to the ED on 12/13 with fever and UTI. PMH significant for breast cancer s/p radiation, COPD, DM, HLD, hypothyroidism, osteoporosis. Pt admitted with sepsis.  Spoke with pt at bedside who reports poor appetite x 1 year PTA. Pt is not sure what caused her appetite to decrease but states, "I'm just not doing well." RN in room providing nursing care.  Pt reports that she does not eat full meals but eats "something" every 2-3 hours because she is diabetes. Pt states that she does not check her blood sugars but takes Metformin and Glipizide at home. Pt states that she can tell when her sugar is low and eats something to help with that. For example, pt may eat canned peaches or pears or may have crackers and cheese. Pt drinks water, coffee, and diet coke. Discussed protein options with pt. Pt states that she does take a MVI daily. RD to order during admission.  Pt believes that she has lost weight ("about 8 lbs") and reports her UBW as 143 lbs. Weight history in chart reflects stable weight since 2016.  Pt shares that she has  diarrhea every other day. Noted pt with distended abdomen. Pt reports that this is normal and that she has had several colonoscopies.  Pt willing to try Glucerna shake during admission. RD to order.  Pt denies any issues chewing or swallowing.  Medications reviewed and include: SSI, levothyroxine, Protonix, IV antibiotics, NS  Labs reviewed: sodium 133 (L) CBG: 123   NUTRITION - FOCUSED PHYSICAL EXAM:    Most Recent Value  Orbital Region  Mild depletion  Upper Arm Region  Mild depletion  Thoracic and Lumbar Region  No depletion  Buccal Region  Mild depletion  Temple Region  Mild depletion  Clavicle Bone Region  Mild depletion  Clavicle and Acromion Bone Region  Moderate depletion  Scapular Bone Region  Unable to assess  Dorsal Hand  Mild depletion  Patellar Region  Mild depletion  Anterior Thigh Region  Mild depletion  Posterior Calf Region  Mild depletion  Edema (RD Assessment)  None  Hair  Reviewed  Eyes  Reviewed  Mouth  Reviewed  Skin  Reviewed  Nails  Reviewed     Suspect muscle depletions in BLE related in part to advanced age.  Diet Order:   Diet Order            Diet Carb Modified Fluid consistency: Thin; Room service appropriate? Yes  Diet effective now              EDUCATION NEEDS:   Education needs have been addressed  Skin:  Skin Assessment: Reviewed RN  Assessment  Last BM:  12/12  Height:   Ht Readings from Last 1 Encounters:  11/25/18 5\' 4"  (1.626 m)    Weight:   Wt Readings from Last 1 Encounters:  11/25/18 64.2 kg    Ideal Body Weight:  54.5 kg  BMI:  Body mass index is 24.29 kg/m.  Estimated Nutritional Needs:   Kcal:  1500-1700  Protein:  75-85 grams  Fluid:  1.5-1.7 L    Gaynell Face, MS, RD, LDN Inpatient Clinical Dietitian Pager: [phone] Weekend/After Hours: [phone]

## 2018-11-25 NOTE — Consult Note (Signed)
Infectious Disease     Reason for Consult:Sepsis   Referring Physician: Estanislado Spire Date of Admission:  11/24/2018   Principal Problem:   Sepsis ([place]) Active Problems:   UTI (urinary tract infection)   HLD (hyperlipidemia)   Hypothyroidism   Diabetes ([place])   COPD with acute bronchitis ([place])   Anxiety   HPI: PEARLINE YERBY is a 83 y.o. female admitted with fevers, chills, malaise. Recently treated as otpt for UTI with macrobid. On admit she had wbc up to 20. Last week had UA as otpt with > 50 wbc. Since admit bcx + E coli.  Changed from ceftriaxone to meropenem.  HD stable currently and feels a little better. Did have and continues to have some L flank pain.  Past Medical History:  Diagnosis Date  . Breast cancer ([place]) 2016   Right- radiation  . COPD (chronic obstructive pulmonary disease) ([place])   . Diabetes mellitus without complication ([place])   . Hyperlipidemia   . Hypothyroidism   . Osteoporosis   . Psoriasis   . Substance abuse ([place])    tobacco  . Thyroid disease   . Vertigo    x1, several yrs ago  . Wears dentures    partial upper   Past Surgical History:  Procedure Laterality Date  . APPENDECTOMY    . BREAST BIOPSY Right 2002   neg  . BREAST EXCISIONAL BIOPSY Right 02/05/2015   lumpectomy + radation  . CATARACT EXTRACTION W/PHACO Left 06/28/2017   Procedure: CATARACT EXTRACTION PHACO AND INTRAOCULAR LENS PLACEMENT (IOC)  Left Diabetic;  Surgeon: Eulogio Bear, MD;  Location: [facility];  Service: Ophthalmology;  Laterality: Left;  Diabetic - oral meds  . CATARACT EXTRACTION W/PHACO Right 08/02/2017   Procedure: CATARACT EXTRACTION PHACO AND INTRAOCULAR LENS PLACEMENT (IOC)RIGHT DIABETIC;  Surgeon: Eulogio Bear, MD;  Location: [facility];  Service: Ophthalmology;  Laterality: Right;  DIABETIC WANTS TO BE AFTER 8  . CESAREAN SECTION    . CHOLECYSTECTOMY    . COLONOSCOPY WITH PROPOFOL N/A 03/05/2016   Procedure: COLONOSCOPY WITH PROPOFOL;   Surgeon: Manya Silvas, MD;  Location: [facility] ENDOSCOPY;  Service: Endoscopy;  Laterality: N/A;  . ESOPHAGOGASTRODUODENOSCOPY (EGD) WITH PROPOFOL N/A 03/05/2016   Procedure: ESOPHAGOGASTRODUODENOSCOPY (EGD) WITH PROPOFOL;  Surgeon: Manya Silvas, MD;  Location: [facility] ENDOSCOPY;  Service: Endoscopy;  Laterality: N/A;  . TONSILLECTOMY     Social History   Tobacco Use  . Smoking status: Current Every Day Smoker    Packs/day: 1.00    Years: 50.00    Pack years: 50.00  . Smokeless tobacco: Never Used  Substance Use Topics  . Alcohol use: No  . Drug use: No   History reviewed. No pertinent family history.  Allergies:  Allergies  Allergen Reactions  . Codeine Diarrhea and Nausea And Vomiting  . Septra [Sulfamethoxazole-Trimethoprim] Swelling  . Sulfa Antibiotics Swelling    Current antibiotics: Antibiotics Given (last 72 hours)    Date/Time Action Medication Dose Rate   11/24/18 2359 New Bag/Given   ceFEPIme (MAXIPIME) 2 g in sodium chloride 0.9 % 100 mL IVPB 2 g 200 mL/hr   11/25/18 0000 New Bag/Given   vancomycin (VANCOCIN) IVPB 1000 mg/200 mL premix 1,000 mg 200 mL/hr   11/25/18 0001 New Bag/Given   metroNIDAZOLE (FLAGYL) IVPB 500 mg 500 mg 100 mL/hr   11/25/18 0753 New Bag/Given   metroNIDAZOLE (FLAGYL) IVPB 500 mg 500 mg 100 mL/hr   11/25/18 1029 New Bag/Given   vancomycin (  VANCOCIN) IVPB 1000 mg/200 mL premix 1,000 mg 200 mL/hr   11/25/18 1337 New Bag/Given   meropenem (MERREM) 1 g in sodium chloride 0.9 % 100 mL IVPB 1 g 200 mL/hr      MEDICATIONS: . aspirin  81 mg Oral Daily  . enoxaparin (LOVENOX) injection  40 mg Subcutaneous Q24H  . feeding supplement (GLUCERNA SHAKE)  237 mL Oral BID BM  . insulin aspart  0-5 Units Subcutaneous QHS  . insulin aspart  0-9 Units Subcutaneous TID WC  . levothyroxine  137 mcg Oral QAC breakfast  . multivitamin with minerals  1 tablet Oral Daily  . pantoprazole  40 mg Oral Daily  . simvastatin  40 mg Oral Daily    Review of  Systems - 11 systems reviewed and negative per HPI   OBJECTIVE: Temp:  [97.8 F (36.6 C)-98.6 F (37 C)] 97.8 F (36.6 C) (12/13 0740) Pulse Rate:  [88-123] 88 (12/13 0740) Resp:  [20-30] 20 (12/13 0215) BP: (124-136)/(55-78) 124/58 (12/13 0740) SpO2:  [94 %-98 %] 97 % (12/13 0740) Weight:  [61.2 kg-64.2 kg] 64.2 kg (12/13 0215) Physical Exam  Constitutional:  oriented to person, place, and time. appears well-developed and well-nourished. No distress.  HENT: [place]/AT, PERRLA, no scleral icterus Mouth/Throat: Oropharynx is clear and moist. No oropharyngeal exudate.  Cardiovascular: Normal rate, regular rhythm and normal heart sounds. Exam reveals no gallop and no friction rub.  No murmur heard.  Pulmonary/Chest: Effort normal and breath sounds normal. No respiratory distress.  has no wheezes.  Neck = supple, no nuchal rigidity Abdominal: Soft. Bowel sounds are normal.  exhibits no distension. There is no tenderness.  Lymphadenopathy: no cervical adenopathy. No axillary adenopathy Neurological: alert and oriented to person, place, and time.  Skin: Skin is warm and dry. No rash noted. No erythema.  Psychiatric: a normal mood and affect.  behavior is normal.    LABS: Results for orders placed or performed during the hospital encounter of 11/24/18 (from the past 48 hour(s))  Comprehensive metabolic panel     Status: Abnormal   Collection Time: 11/24/18 11:44 PM  Result Value Ref Range   Sodium 131 (L) 135 - 145 mmol/L   Potassium 3.7 3.5 - 5.1 mmol/L   Chloride 99 98 - 111 mmol/L   CO2 19 (L) 22 - 32 mmol/L   Glucose, Bld 119 (H) 70 - 99 mg/dL   BUN 13 8 - 23 mg/dL   Creatinine, Ser 0.73 0.44 - 1.00 mg/dL   Calcium 8.6 (L) 8.9 - 10.3 mg/dL   Total Protein 6.5 6.5 - 8.1 g/dL   Albumin 3.4 (L) 3.5 - 5.0 g/dL   AST 29 15 - 41 U/L   ALT 16 0 - 44 U/L   Alkaline Phosphatase 88 38 - 126 U/L   Total Bilirubin 0.9 0.3 - 1.2 mg/dL   GFR calc non Af Amer >60 >60 mL/min   GFR calc Af  Amer >60 >60 mL/min   Anion gap 13 5 - 15    Comment: Performed at [facility], [address] [place], [place] [address]  CBC with Differential     Status: Abnormal   Collection Time: 11/24/18 11:44 PM  Result Value Ref Range   WBC 10.0 4.0 - 10.5 K/uL   RBC 4.76 3.87 - 5.11 MIL/uL   Hemoglobin 14.2 12.0 - 15.0 g/dL   HCT 43.1 36.0 - 46.0 %   MCV 90.5 80.0 - 100.0 fL   MCH 29.8 26.0 -  34.0 pg   MCHC 32.9 30.0 - 36.0 g/dL   RDW 13.0 11.5 - 15.5 %   Platelets 182 150 - 400 K/uL   nRBC 0.0 0.0 - 0.2 %   Neutrophils Relative % 92 %   Neutro Abs 9.3 (H) 1.7 - 7.7 K/uL   Lymphocytes Relative 4 %   Lymphs Abs 0.4 (L) 0.7 - 4.0 K/uL   Monocytes Relative 3 %   Monocytes Absolute 0.3 0.1 - 1.0 K/uL   Eosinophils Relative 0 %   Eosinophils Absolute 0.0 0.0 - 0.5 K/uL   Basophils Relative 1 %   Basophils Absolute 0.1 0.0 - 0.1 K/uL   Immature Granulocytes 0 %   Abs Immature Granulocytes 0.04 0.00 - 0.07 K/uL    Comment: Performed at [facility], [address] [place], [place] [address]  Protime-INR     Status: None   Collection Time: 11/24/18 11:44 PM  Result Value Ref Range   Prothrombin Time 14.7 11.4 - 15.2 seconds   INR 1.16     Comment: Performed at [facility], [address], [place], [place] [address]  Culture, blood (Routine x 2)     Status: None (Preliminary result)   Collection Time: 11/24/18 11:44 PM  Result Value Ref Range   Specimen Description BLOOD RIGHT ANTECUBITAL    Special Requests      BOTTLES DRAWN AEROBIC AND ANAEROBIC Blood Culture adequate volume   Culture  Setup Time      Organism ID to follow GRAM NEGATIVE RODS ANAEROBIC BOTTLE ONLY CRITICAL RESULT CALLED TO, READ BACK BY AND VERIFIED WITH: CHRISTINE KATSOUDAS ON 11/25/18 AT 1159 QSD Performed at [facility], [address] [place], [place] [address]    Culture GRAM NEGATIVE RODS    Report Status PENDING   Culture, blood (Routine x 2)     Status: None  (Preliminary result)   Collection Time: 11/24/18 11:44 PM  Result Value Ref Range   Specimen Description BLOOD LEFT ANTECUBITAL    Special Requests      BOTTLES DRAWN AEROBIC AND ANAEROBIC Blood Culture results may not be optimal due to an excessive volume of blood received in culture bottles   Culture      NO GROWTH < 12 HOURS Performed at [facility], [address] [place], [place] [address]    Report Status PENDING   Blood Culture ID Panel (Reflexed)     Status: Abnormal   Collection Time: 11/24/18 11:44 PM  Result Value Ref Range   Enterococcus species NOT DETECTED NOT DETECTED   Listeria monocytogenes NOT DETECTED NOT DETECTED   Staphylococcus species NOT DETECTED NOT DETECTED   Staphylococcus aureus (BCID) NOT DETECTED NOT DETECTED   Streptococcus species NOT DETECTED NOT DETECTED   Streptococcus agalactiae NOT DETECTED NOT DETECTED   Streptococcus pneumoniae NOT DETECTED NOT DETECTED   Streptococcus pyogenes NOT DETECTED NOT DETECTED   Acinetobacter baumannii NOT DETECTED NOT DETECTED   Enterobacteriaceae species DETECTED (A) NOT DETECTED    Comment: Enterobacteriaceae represent a large family of gram-negative bacteria, not a single organism. CRITICAL RESULT CALLED TO, READ BACK BY AND VERIFIED WITH: CHRISTINE KATSOUDAS ON 11/25/18 AT 1159 QSD    Enterobacter cloacae complex NOT DETECTED NOT DETECTED   Escherichia coli DETECTED (A) NOT DETECTED    Comment: CRITICAL RESULT CALLED TO, READ BACK BY AND VERIFIED WITH: [place] ON 11/25/18 AT 1159 QSD    Klebsiella oxytoca NOT DETECTED NOT DETECTED   Klebsiella pneumoniae NOT  DETECTED NOT DETECTED   Proteus species NOT DETECTED NOT DETECTED   Serratia marcescens NOT DETECTED NOT DETECTED   Carbapenem resistance NOT DETECTED NOT DETECTED   Haemophilus influenzae NOT DETECTED NOT DETECTED   Neisseria meningitidis NOT DETECTED NOT DETECTED   Pseudomonas aeruginosa NOT DETECTED NOT DETECTED   Candida  albicans NOT DETECTED NOT DETECTED   Candida glabrata NOT DETECTED NOT DETECTED   Candida krusei NOT DETECTED NOT DETECTED   Candida parapsilosis NOT DETECTED NOT DETECTED   Candida tropicalis NOT DETECTED NOT DETECTED    Comment: Performed at [facility], [place], [place] [address]  CG4 I-STAT (Lactic acid)     Status: Abnormal   Collection Time: 11/24/18 11:50 PM  Result Value Ref Range   Lactic Acid, Venous 4.62 (HH) 0.5 - 1.9 mmol/L   Comment NOTIFIED PHYSICIAN   Influenza panel by PCR (type A & B)     Status: None   Collection Time: 11/24/18 11:51 PM  Result Value Ref Range   Influenza A By PCR NEGATIVE NEGATIVE   Influenza B By PCR NEGATIVE NEGATIVE    Comment: (NOTE) The Xpert Xpress Flu assay is intended as an aid in the diagnosis of  influenza and should not be used as a sole basis for treatment.  This  assay is FDA approved for nasopharyngeal swab specimens only. Nasal  washings and aspirates are unacceptable for Xpert Xpress Flu testing. Performed at [facility], [address] [place], [place] [address]   Urinalysis, Complete w Microscopic     Status: Abnormal   Collection Time: 11/25/18 12:03 AM  Result Value Ref Range   Color, Urine YELLOW (A) YELLOW   APPearance HAZY (A) CLEAR   Specific Gravity, Urine 1.016 1.005 - 1.030   pH 5.0 5.0 - 8.0   Glucose, UA NEGATIVE NEGATIVE mg/dL   Hgb urine dipstick NEGATIVE NEGATIVE   Bilirubin Urine NEGATIVE NEGATIVE   Ketones, ur 20 (A) NEGATIVE mg/dL   Protein, ur 30 (A) NEGATIVE mg/dL   Nitrite NEGATIVE NEGATIVE   Leukocytes, UA MODERATE (A) NEGATIVE   RBC / HPF 0-5 0 - 5 RBC/hpf   WBC, UA 21-50 0 - 5 WBC/hpf   Bacteria, UA RARE (A) NONE SEEN   Squamous Epithelial / LPF 0-5 0 - 5   Mucus PRESENT     Comment: Performed at [facility], [place], [place] [address]  CG4 I-STAT (Lactic acid)     Status: None   Collection Time: 11/25/18  1:44 AM  Result Value  Ref Range   Lactic Acid, Venous 1.49 0.5 - 1.9 mmol/L  Lactic acid, plasma     Status: None   Collection Time: 11/25/18  2:44 AM  Result Value Ref Range   Lactic Acid, Venous 1.5 0.5 - 1.9 mmol/L    Comment: Performed at [facility], [address] [place], [place] [address]  Lactic acid, plasma     Status: None   Collection Time: 11/25/18  5:30 AM  Result Value Ref Range   Lactic Acid, Venous 1.1 0.5 - 1.9 mmol/L    Comment: Performed at [facility], [address] [place], [place] [address]  Basic metabolic panel     Status: Abnormal   Collection Time: 11/25/18  5:30 AM  Result Value Ref Range   Sodium 133 (L) 135 - 145 mmol/L   Potassium 3.7 3.5 - 5.1 mmol/L   Chloride 103 98 - 111 mmol/L  CO2 22 22 - 32 mmol/L   Glucose, Bld 160 (H) 70 - 99 mg/dL   BUN 11 8 - 23 mg/dL   Creatinine, Ser 0.61 0.44 - 1.00 mg/dL   Calcium 7.9 (L) 8.9 - 10.3 mg/dL   GFR calc non Af Amer >60 >60 mL/min   GFR calc Af Amer >60 >60 mL/min   Anion gap 8 5 - 15    Comment: Performed at [facility], [address] [place], [place] [address]  CBC     Status: Abnormal   Collection Time: 11/25/18  5:30 AM  Result Value Ref Range   WBC 20.2 (H) 4.0 - 10.5 K/uL   RBC 4.21 3.87 - 5.11 MIL/uL   Hemoglobin 12.4 12.0 - 15.0 g/dL   HCT 38.1 36.0 - 46.0 %   MCV 90.5 80.0 - 100.0 fL   MCH 29.5 26.0 - 34.0 pg   MCHC 32.5 30.0 - 36.0 g/dL   RDW 13.0 11.5 - 15.5 %   Platelets 169 150 - 400 K/uL   nRBC 0.0 0.0 - 0.2 %    Comment: Performed at [facility], [address] [place], [place] [address]  Glucose, capillary     Status: Abnormal   Collection Time: 11/25/18  7:39 AM  Result Value Ref Range   Glucose-Capillary 123 (H) 70 - 99 mg/dL   Comment 1 Notify RN   Glucose, capillary     Status: Abnormal   Collection Time: 11/25/18 11:48 AM  Result Value Ref Range   Glucose-Capillary 125 (H) 70 - 99 mg/dL   Comment 1 Notify RN    No components found for: ESR, C  REACTIVE PROTEIN MICRO: Recent Results (from the past 720 hour(s))  Culture, blood (Routine x 2)     Status: None (Preliminary result)   Collection Time: 11/24/18 11:44 PM  Result Value Ref Range Status   Specimen Description BLOOD RIGHT ANTECUBITAL  Final   Special Requests   Final    BOTTLES DRAWN AEROBIC AND ANAEROBIC Blood Culture adequate volume   Culture  Setup Time   Final    Organism ID to follow GRAM NEGATIVE RODS ANAEROBIC BOTTLE ONLY CRITICAL RESULT CALLED TO, READ BACK BY AND VERIFIED WITH: CHRISTINE KATSOUDAS ON 11/25/18 AT 1159 QSD Performed at [facility], [address] [place], [place] [address]    Culture GRAM NEGATIVE RODS  Final   Report Status PENDING  Incomplete  Culture, blood (Routine x 2)     Status: None (Preliminary result)   Collection Time: 11/24/18 11:44 PM  Result Value Ref Range Status   Specimen Description BLOOD LEFT ANTECUBITAL  Final   Special Requests   Final    BOTTLES DRAWN AEROBIC AND ANAEROBIC Blood Culture results may not be optimal due to an excessive volume of blood received in culture bottles   Culture   Final    NO GROWTH < 12 HOURS Performed at [facility], [address] [place], [place] [address]    Report Status PENDING  Incomplete  Blood Culture ID Panel (Reflexed)     Status: Abnormal   Collection Time: 11/24/18 11:44 PM  Result Value Ref Range Status   Enterococcus species NOT DETECTED NOT DETECTED Final   Listeria monocytogenes NOT DETECTED NOT DETECTED Final   Staphylococcus species NOT DETECTED NOT DETECTED Final   Staphylococcus aureus (BCID) NOT DETECTED NOT DETECTED Final   Streptococcus species NOT DETECTED NOT DETECTED Final   Streptococcus agalactiae NOT DETECTED NOT  DETECTED Final   Streptococcus pneumoniae NOT DETECTED NOT DETECTED Final   Streptococcus pyogenes NOT DETECTED NOT DETECTED Final   Acinetobacter baumannii NOT DETECTED NOT DETECTED Final   Enterobacteriaceae species DETECTED  (A) NOT DETECTED Final    Comment: Enterobacteriaceae represent a large family of gram-negative bacteria, not a single organism. CRITICAL RESULT CALLED TO, READ BACK BY AND VERIFIED WITH: CHRISTINE KATSOUDAS ON 11/25/18 AT 1159 QSD    Enterobacter cloacae complex NOT DETECTED NOT DETECTED Final   Escherichia coli DETECTED (A) NOT DETECTED Final    Comment: CRITICAL RESULT CALLED TO, READ BACK BY AND VERIFIED WITH: CHRISTINE KATSOUDAS ON 11/25/18 AT 1159 QSD    Klebsiella oxytoca NOT DETECTED NOT DETECTED Final   Klebsiella pneumoniae NOT DETECTED NOT DETECTED Final   Proteus species NOT DETECTED NOT DETECTED Final   Serratia marcescens NOT DETECTED NOT DETECTED Final   Carbapenem resistance NOT DETECTED NOT DETECTED Final   Haemophilus influenzae NOT DETECTED NOT DETECTED Final   Neisseria meningitidis NOT DETECTED NOT DETECTED Final   Pseudomonas aeruginosa NOT DETECTED NOT DETECTED Final   Candida albicans NOT DETECTED NOT DETECTED Final   Candida glabrata NOT DETECTED NOT DETECTED Final   Candida krusei NOT DETECTED NOT DETECTED Final   Candida parapsilosis NOT DETECTED NOT DETECTED Final   Candida tropicalis NOT DETECTED NOT DETECTED Final    Comment: Performed at [facility], [address], [place], [place] [address]    IMAGING: Dg Chest Portable 1 View  Result Date: 11/25/2018 CLINICAL DATA:  83 year old female with sepsis. EXAM: PORTABLE CHEST 1 VIEW COMPARISON:  None. FINDINGS: Mild diffuse interstitial coarsening and chronic bronchitic changes. No focal consolidation, pleural effusion, pneumothorax. The cardiac silhouette is within normal limits. No acute osseous pathology. IMPRESSION: No active disease. Electronically Signed   By: Anner Crete M.D.   On: 11/25/2018 00:15    Assessment:   ALURA OLVEDA is a 83 y.o. female admitted with urosepsis and now E coli bacteremia.  Has a hx of recurrent UTI and has seen urology in the past. Currently having some  continued L flank pain.  Recommendations Cont meropenem pending ucx and bcx results Check CT stone  Protocol to eval for nephrolithiasis or pyelo. Hopefully is sensitive organism can be dced in 2-3 days on oral abx.  Thank you very much for allowing me to participate in the care of this patient. Please call with questions.   Cheral Marker. Ola Spurr, MD

## 2018-11-25 NOTE — ED Provider Notes (Addendum)
[facility] Emergency Department Provider Note _________   First MD Initiated Contact with Patient 11/24/18 2345     (approximate)  I have reviewed the triage vital signs and the nursing notes.   HISTORY  Chief Complaint Fever   HPI [name] is a 83 y.o. female with below list of chronic medical conditions presents to the emergency department via EMS with fever.  Patient states that she has been diagnosed with a urinary tract infection for which she was prescribed Macrobid which she took for 5 days without any improvement of her symptoms and as such her primary care provider repeated another 5 days of Macrobid.  Patient continues to have urinary discomfort.  Patient also admits to dyspnea and generalized body aches.   Past Medical History:  Diagnosis Date  . Breast cancer ([place]) 2016   Right- radiation  . COPD (chronic obstructive pulmonary disease) ([place])   . Diabetes mellitus without complication ([place])   . Hyperlipidemia   . Hypothyroidism   . Osteoporosis   . Psoriasis   . Substance abuse ([place])    tobacco  . Thyroid disease   . Vertigo    x1, several yrs ago  . Wears dentures    partial upper    Patient Active Problem List   Diagnosis Date Noted  . Sepsis ([place]) 11/25/2018  . UTI (urinary tract infection) 11/25/2018  . HLD (hyperlipidemia) 11/25/2018  . Hypothyroidism 11/25/2018  . COPD (chronic obstructive pulmonary disease) ([place]) 11/25/2018  . Diabetes ([place]) 11/25/2018  . COPD with acute bronchitis ([place]) 11/25/2018  . Anxiety 11/25/2018    Past Surgical History:  Procedure Laterality Date  . APPENDECTOMY    . BREAST BIOPSY Right 2002   neg  . BREAST EXCISIONAL BIOPSY Right 02/05/2015   lumpectomy + radation  . CATARACT EXTRACTION W/PHACO Left 06/28/2017   Procedure: CATARACT EXTRACTION PHACO AND INTRAOCULAR LENS PLACEMENT (IOC)  Left Diabetic;  Surgeon: Eulogio Bear, MD;  Location: [place];  Service:  Ophthalmology;  Laterality: Left;  Diabetic - oral meds  . CATARACT EXTRACTION W/PHACO Right 08/02/2017   Procedure: CATARACT EXTRACTION PHACO AND INTRAOCULAR LENS PLACEMENT (IOC)RIGHT DIABETIC;  Surgeon: Eulogio Bear, MD;  Location: [place];  Service: Ophthalmology;  Laterality: Right;  DIABETIC WANTS TO BE AFTER 8  . CESAREAN SECTION    . CHOLECYSTECTOMY    . COLONOSCOPY WITH PROPOFOL N/A 03/05/2016   Procedure: COLONOSCOPY WITH PROPOFOL;  Surgeon: Manya Silvas, MD;  [facility] ENDOSCOPY;  Service: Endoscopy;  Laterality: N/A;  . ESOPHAGOGASTRODUODENOSCOPY (EGD) WITH PROPOFOL N/A 03/05/2016   Procedure: ESOPHAGOGASTRODUODENOSCOPY (EGD) WITH PROPOFOL;  Surgeon: Manya Silvas, MD;  Location: [facility] ENDOSCOPY;  Service: Endoscopy;  Laterality: N/A;  . TONSILLECTOMY      Prior to Admission medications   Medication Sig Start Date End Date Taking? Authorizing Provider  Apremilast 30 MG TABS Take 1 tablet by mouth daily.    [provider]  Ascorbic Acid (VITAMIN C PO) Take 500 mg by mouth daily.    [provider]  aspirin 81 MG tablet Take 81 mg by mouth daily.    [provider]  clobetasol (TEMOVATE) 0.05 % GEL Apply topically 2 (two) times daily.    [provider]  clonazePAM (KLONOPIN) 0.5 MG tablet Take 0.5 mg by mouth 2 (two) times daily as needed for anxiety.    [provider]  diphenhydramine-acetaminophen (TYLENOL PM) 25-500 MG TABS tablet Take 1 tablet by mouth  at bedtime as needed.    [provider]  Ferrous Sulfate (SLOW FE PO) Take 1 tablet by mouth daily.    [provider]  glipiZIDE (GLUCOTROL) 10 MG tablet Take 10 mg by mouth 2 (two) times daily before a meal.    [provider]  letrozole (FEMARA) 2.5 MG tablet Take 2.5 mg by mouth daily.    [provider]  levothyroxine (SYNTHROID, LEVOTHROID) 137 MCG tablet Take 137 mcg by mouth daily before breakfast.    [provider]  LORazepam (ATIVAN) 0.5 MG tablet Take 0.5 mg by mouth every 8 (eight) hours.    [provider]  metFORMIN (GLUCOPHAGE) 1000 MG tablet Take 1,000 mg by mouth 2 (two) times daily with a meal.    [provider]  Multiple Vitamins-Minerals (CENTRUM SILVER PO) Take by mouth daily.    [provider]  Multiple Vitamins-Minerals (ZINC PO) Take 50 mg by mouth daily.    [provider]  omeprazole (PRILOSEC) 20 MG capsule Take 20 mg by mouth daily.    [provider]  simvastatin (ZOCOR) 40 MG tablet Take 40 mg by mouth daily.    [provider]    Allergies Codeine; Septra [sulfamethoxazole-trimethoprim]; and Sulfa antibiotics  History reviewed. No pertinent family history.  Social History Social History   Tobacco Use  . Smoking status: Current Every Day Smoker    Packs/day: 1.00    Years: 50.00    Pack years: 50.00  . Smokeless tobacco: Never Used  Substance Use Topics  . Alcohol use: No  . Drug use: No    Review of Systems Constitutional: Positive for fever Eyes: No visual changes. ENT: No sore throat. Cardiovascular: Denies chest pain. Respiratory: Positive for shortness of breath. Gastrointestinal: No abdominal pain.  No nausea, no vomiting.  No diarrhea.  No constipation. Genitourinary: Positive for dysuria. Musculoskeletal: Negative for neck pain.  Negative for back pain. Integumentary: Negative for rash. Neurological: Negative for headaches, focal weakness or numbness.   ____________________________________________   PHYSICAL EXAM:  VITAL SIGNS: ED Triage Vitals  Enc Vitals Group     BP 11/25/18 0000 (!) 130/56     Pulse Rate 11/24/18 2343 (!) 111     Resp 11/24/18 2343 (!) 21     Temp 11/24/18 2343 98.3 F (36.8 C)     Temp Source 11/24/18 2343 Oral     SpO2 11/24/18 2343 96 %     Weight --      Height --      Head Circumference --      Peak Flow --      Pain Score --      Pain Loc --       Pain Edu? --      Excl. in [place]? --     Constitutional: Alert and oriented. Well appearing and in no acute distress. Eyes: Conjunctivae are normal.  Mouth/Throat: Mucous membranes are moist.  Oropharynx non-erythematous. Neck: No stridor.  No meningeal signs.   Cardiovascular: Normal rate, regular rhythm. Good peripheral circulation. Grossly normal heart sounds. Respiratory: Normal respiratory effort.  No retractions. Lungs CTAB. Gastrointestinal: Soft and nontender. No distention.  Musculoskeletal: No lower extremity tenderness nor edema. No gross deformities of extremities. Neurologic:  Normal speech and language. No gross focal neurologic deficits are appreciated.  Skin:  Skin is warm, dry and intact. No rash noted. Psychiatric: Mood and affect are normal. Speech and behavior are normal.  ____________________________________________   LABS (  all labs ordered are listed, but only abnormal results are displayed)  Labs Reviewed  COMPREHENSIVE METABOLIC PANEL - Abnormal; Notable for the following components:      Result Value   Sodium 131 (*)    CO2 19 (*)    Glucose, Bld 119 (*)    Calcium 8.6 (*)    Albumin 3.4 (*)    All other components within normal limits  CBC WITH DIFFERENTIAL/PLATELET - Abnormal; Notable for the following components:   Neutro Abs 9.3 (*)    Lymphs Abs 0.4 (*)    All other components within normal limits  URINALYSIS, COMPLETE (UACMP) WITH MICROSCOPIC - Abnormal; Notable for the following components:   Color, Urine YELLOW (*)    APPearance HAZY (*)    Ketones, ur 20 (*)    Protein, ur 30 (*)    Leukocytes, UA MODERATE (*)    Bacteria, UA RARE (*)    All other components within normal limits  CG4 I-STAT (LACTIC ACID) - Abnormal; Notable for the following components:   Lactic Acid, Venous 4.62 (*)    All other components within normal limits  CULTURE, BLOOD (ROUTINE X 2)  CULTURE, BLOOD (ROUTINE X 2)  URINE CULTURE  PROTIME-INR  INFLUENZA PANEL  BY PCR (TYPE A & B)  I-STAT CG4 LACTIC ACID, ED  I-STAT CG4 LACTIC ACID, ED   _______  RADIOLOGY I, [place] Ernst Bowler, personally viewed and evaluated these images (plain radiographs) as part of my medical decision making, as well as reviewing the written report by the radiologist.  ED MD interpretation: No active cardiopulmonary disease on chest x-ray per radiologist  Official radiology report(s): Dg Chest Portable 1 View  Result Date: 11/25/2018 CLINICAL DATA:  83 year old female with sepsis. EXAM: PORTABLE CHEST 1 VIEW COMPARISON:  None. FINDINGS: Mild diffuse interstitial coarsening and chronic bronchitic changes. No focal consolidation, pleural effusion, pneumothorax. The cardiac silhouette is within normal limits. No acute osseous pathology. IMPRESSION: No active disease. Electronically Signed   By: Anner Crete M.D.   On: 11/25/2018 00:15    ____________________________________________   PROCEDURES  Critical Care performed:   .Critical Care Performed by: Gregor Hams, MD Authorized by: Gregor Hams, MD   Critical care provider statement:    Critical care time (minutes):  45   Critical care time was exclusive of:  Separately billable procedures and treating other patients   Critical care was necessary to treat or prevent imminent or life-threatening deterioration of the following conditions:  Sepsis   Critical care was time spent personally by me on the following activities:  Development of treatment plan with patient or surrogate, discussions with consultants, evaluation of patient's response to treatment, examination of patient, obtaining history from patient or surrogate, ordering and performing treatments and interventions, ordering and review of laboratory studies, ordering and review of radiographic studies, pulse oximetry, re-evaluation of patient's condition and review of old charts   I assumed direction of critical care for this patient from another  provider in my specialty: no       ____________________________________________   INITIAL IMPRESSION / ASSESSMENT AND PLAN / ED COURSE  As part of my medical decision making, I reviewed the following data within the electronic MEDICAL RECORD NUMBER  83 year old female presented with above-stated history and physical exam secondary to febrile illness with recent urinary tract infection.  Concern for possible sepsis given fever tachycardia with known infection.  Laboratory data notable for lactic acid of 4.6.  Sepsis protocol initiated patient  given appropriate antibiotic therapy.  Patient discussed with Dr. Jannifer Franklin for hospital admission for further evaluation and management. ____________________________________________  FINAL CLINICAL IMPRESSION(S) / ED DIAGNOSES  Final diagnoses:  Sepsis without acute organ dysfunction, due to unspecified organism [facility])     MEDICATIONS GIVEN DURING THIS VISIT:  Medications  metroNIDAZOLE (FLAGYL) IVPB 500 mg (500 mg Intravenous New Bag/Given 11/25/18 0001)  ceFEPIme (MAXIPIME) 2 g in sodium chloride 0.9 % 100 mL IVPB (0 g Intravenous Stopped 11/25/18 0100)  vancomycin (VANCOCIN) IVPB 1000 mg/200 mL premix (0 mg Intravenous Stopped 11/25/18 0106)     ED Discharge Orders    None       Note:  This document was prepared using Dragon voice recognition software and may include unintentional dictation errors.    Gregor Hams, MD 11/25/18 0126    Gregor Hams, MD 11/25/18 [id number]

## 2018-11-25 NOTE — Progress Notes (Signed)
CODE SEPSIS - PHARMACY COMMUNICATION  **Broad Spectrum Antibiotics should be administered within 1 hour of Sepsis diagnosis**  Time Code Sepsis Called/Page Received: 12/12 2345  Antibiotics Ordered: 12/12 2345  Time of 1st antibiotic administration: 12/12 2359  Additional action taken by pharmacy:   If necessary, Name of Provider/Nurse Contacted:     Eloise Harman ,PharmD Clinical Pharmacist  11/25/2018  1:07 AM

## 2018-11-26 DIAGNOSIS — E440 Moderate protein-calorie malnutrition: Secondary | ICD-10-CM

## 2018-11-26 LAB — CBC
HCT: 37.9 % (ref 36.0–46.0)
Hemoglobin: 12.4 g/dL (ref 12.0–15.0)
MCH: 29.7 pg (ref 26.0–34.0)
MCHC: 32.7 g/dL (ref 30.0–36.0)
MCV: 90.7 fL (ref 80.0–100.0)
Platelets: 161 10*3/uL (ref 150–400)
RBC: 4.18 MIL/uL (ref 3.87–5.11)
RDW: 12.9 % (ref 11.5–15.5)
WBC: 8.7 10*3/uL (ref 4.0–10.5)
nRBC: 0 % (ref 0.0–0.2)

## 2018-11-26 LAB — BASIC METABOLIC PANEL
Anion gap: 6 (ref 5–15)
BUN: 8 mg/dL (ref 8–23)
CO2: 23 mmol/L (ref 22–32)
Calcium: 7.8 mg/dL — ABNORMAL LOW (ref 8.9–10.3)
Chloride: 106 mmol/L (ref 98–111)
Creatinine, Ser: 0.55 mg/dL (ref 0.44–1.00)
GFR calc Af Amer: >60 mL/min (ref >60)
GFR calc non Af Amer: >60 mL/min (ref >60)
Glucose, Bld: 120 mg/dL — ABNORMAL HIGH (ref 70–99)
Potassium: 3.3 mmol/L — ABNORMAL LOW (ref 3.5–5.1)
Sodium: 135 mmol/L (ref 135–145)

## 2018-11-26 LAB — GLUCOSE, CAPILLARY
Glucose-Capillary: 130 mg/dL — ABNORMAL HIGH (ref 70–99)
Glucose-Capillary: 153 mg/dL — ABNORMAL HIGH (ref 70–99)
Glucose-Capillary: 130 mg/dL — ABNORMAL HIGH (ref 70–99)
Glucose-Capillary: 152 mg/dL — ABNORMAL HIGH (ref 70–99)

## 2018-11-26 LAB — MAGNESIUM: Magnesium: 1.6 mg/dL — ABNORMAL LOW (ref 1.7–2.4)

## 2018-11-26 LAB — URINE CULTURE

## 2018-11-26 MED ORDER — MAGNESIUM SULFATE 2 GM/50ML IV SOLN
2.0000 g | Freq: Once | INTRAVENOUS | Status: AC
  Administered 2018-11-26: 2 g via INTRAVENOUS
  Filled 2018-11-26: qty 50

## 2018-11-26 MED ORDER — POTASSIUM CHLORIDE CRYS ER 20 MEQ PO TBCR
40.0000 meq | EXTENDED_RELEASE_TABLET | Freq: Once | ORAL | Status: AC
  Administered 2018-11-26: 40 meq via ORAL
  Filled 2018-11-26: qty 2

## 2018-11-26 NOTE — Progress Notes (Signed)
[place] INFECTIOUS DISEASE PROGRESS NOTE Date of Admission:  16/09/9603     ID: [name] is a 83 y.o. female with E coli urosepsis Principal Problem:   Sepsis ([place]) Active Problems:   UTI (urinary tract infection)   HLD (hyperlipidemia)   Hypothyroidism   Diabetes ([place])   COPD with acute bronchitis (H[facility])   Anxiety   Malnutrition of moderate degree   Subjective: No fevers, feels a little stronger  ROS  Eleven systems are reviewed and negative except per hpi  Medications:  Antibiotics Given (last 72 hours)    Date/Time Action Medication Dose Rate   11/24/18 2359 New Bag/Given   ceFEPIme (MAXIPIME) 2 g in sodium chloride 0.9 % 100 mL IVPB 2 g 200 mL/hr   11/25/18 0000 New Bag/Given   vancomycin (VANCOCIN) IVPB 1000 mg/200 mL premix 1,000 mg 200 mL/hr   11/25/18 0001 New Bag/Given   metroNIDAZOLE (FLAGYL) IVPB 500 mg 500 mg 100 mL/hr   11/25/18 0753 New Bag/Given   metroNIDAZOLE (FLAGYL) IVPB 500 mg 500 mg 100 mL/hr   11/25/18 1029 New Bag/Given   vancomycin (VANCOCIN) IVPB 1000 mg/200 mL premix 1,000 mg 200 mL/hr   11/25/18 1337 New Bag/Given   meropenem (MERREM) 1 g in sodium chloride 0.9 % 100 mL IVPB 1 g 200 mL/hr   11/25/18 2204 New Bag/Given   meropenem (MERREM) 1 g in sodium chloride 0.9 % 100 mL IVPB 1 g 200 mL/hr     . aspirin  81 mg Oral Daily  . enoxaparin (LOVENOX) injection  40 mg Subcutaneous Q24H  . feeding supplement (GLUCERNA SHAKE)  237 mL Oral BID BM  . insulin aspart  0-5 Units Subcutaneous QHS  . insulin aspart  0-9 Units Subcutaneous TID WC  . levothyroxine  137 mcg Oral QAC breakfast  . multivitamin with minerals  1 tablet Oral Daily  . pantoprazole  40 mg Oral Daily  . potassium chloride  40 mEq Oral Once  . simvastatin  40 mg Oral Daily    Objective: Vital signs in last 24 hours: Temp:  [98.7 F (37.1 C)-99.4 F (37.4 C)] 99.4 F (37.4 C) (12/14 0808) Pulse Rate:  [85-87] 85 (12/14 0808) Resp:  [18] 18 (12/13 2108) BP:  (124-159)/(57-71) 159/71 (12/14 0808) SpO2:  [94 %-95 %] 94 % (12/14 5409) Constitutional:  oriented to person, place, and time. appears well-developed and well-nourished. No distress.  HENT: [place]/AT, PERRLA, no scleral icterus Mouth/Throat: Oropharynx is clear and moist. No oropharyngeal exudate.  Cardiovascular: Normal rate, regular rhythm and normal heart sounds. Exam reveals no gallop and no friction rub.  No murmur heard.  Pulmonary/Chest: Effort normal and breath sounds normal. No respiratory distress.  has no wheezes.  Neck = supple, no nuchal rigidity Abdominal: Soft. Bowel sounds are normal.  exhibits no distension. There is no tenderness.  Lymphadenopathy: no cervical adenopathy. No axillary adenopathy Neurological: alert and oriented to person, place, and time.  Skin: Skin is warm and dry. No rash noted. No erythema.  Psychiatric: a normal mood and affect.  behavior is normal.   Lab Results Recent Labs    11/25/18 0530 11/26/18 0319  WBC 20.2* 8.7  HGB 12.4 12.4  HCT 38.1 37.9  NA 133* 135  K 3.7 3.3*  CL 103 106  CO2 22 23  BUN 11 8  CREATININE 0.61 0.55    Microbiology: Results for orders placed or performed during the hospital encounter of 11/24/18  Culture, blood (Routine x 2)  Status: Abnormal (Preliminary result)   Collection Time: 11/24/18 11:44 PM  Result Value Ref Range Status   Specimen Description   Final    BLOOD RIGHT ANTECUBITAL Performed at [facility], [address] [place], [place] [address]    Special Requests   Final    BOTTLES DRAWN AEROBIC AND ANAEROBIC Blood Culture adequate volume Performed at [facility], [address] [place], [place] [address]    Culture  Setup Time   Final    GRAM NEGATIVE RODS IN BOTH AEROBIC AND ANAEROBIC BOTTLES CRITICAL RESULT CALLED TO, READ BACK BY AND VERIFIED WITH: CHRISTINE KATSOUDAS ON 11/25/18 AT 1159 QSD GRAM STAIN REVIEWED-AGREE WITH RESULT Performed at [place], [place] [address] [place], [place] [address]    Culture ESCHERICHIA COLI (A)  Final   Report Status PENDING  Incomplete  Culture, blood (Routine x 2)     Status: None (Preliminary result)   Collection Time: 11/24/18 11:44 PM  Result Value Ref Range Status   Specimen Description BLOOD LEFT ANTECUBITAL  Final   Special Requests   Final    BOTTLES DRAWN AEROBIC AND ANAEROBIC Blood Culture results may not be optimal due to an excessive volume of blood received in culture bottles   Culture   Final    NO GROWTH 2 DAYS Performed at [facility], [address] [place], [place] [address]    Report Status PENDING  Incomplete  Blood Culture ID Panel (Reflexed)     Status: Abnormal   Collection Time: 11/24/18 11:44 PM  Result Value Ref Range Status   Enterococcus species NOT DETECTED NOT DETECTED Final   Listeria monocytogenes NOT DETECTED NOT DETECTED Final   Staphylococcus species NOT DETECTED NOT DETECTED Final   Staphylococcus aureus (BCID) NOT DETECTED NOT DETECTED Final   Streptococcus species NOT DETECTED NOT DETECTED Final   Streptococcus agalactiae NOT DETECTED NOT DETECTED Final   Streptococcus pneumoniae NOT DETECTED NOT DETECTED Final   Streptococcus pyogenes NOT DETECTED NOT DETECTED Final   Acinetobacter baumannii NOT DETECTED NOT DETECTED Final   Enterobacteriaceae species DETECTED (A) NOT DETECTED Final    Comment: Enterobacteriaceae represent a large family of gram-negative bacteria, not a single organism. CRITICAL RESULT CALLED TO, READ BACK BY AND VERIFIED WITH: CHRISTINE KATSOUDAS ON 11/25/18 AT 1159 QSD    Enterobacter cloacae complex NOT DETECTED NOT DETECTED Final   Escherichia coli DETECTED (A) NOT DETECTED Final    Comment: CRITICAL RESULT CALLED TO, READ BACK BY AND VERIFIED WITH: CHRISTINE KATSOUDAS ON 11/25/18 AT 1159 QSD    Klebsiella oxytoca NOT DETECTED NOT DETECTED Final   Klebsiella pneumoniae NOT DETECTED NOT DETECTED Final   Proteus species NOT  DETECTED NOT DETECTED Final   Serratia marcescens NOT DETECTED NOT DETECTED Final   Carbapenem resistance NOT DETECTED NOT DETECTED Final   Haemophilus influenzae NOT DETECTED NOT DETECTED Final   Neisseria meningitidis NOT DETECTED NOT DETECTED Final   Pseudomonas aeruginosa NOT DETECTED NOT DETECTED Final   Candida albicans NOT DETECTED NOT DETECTED Final   Candida glabrata NOT DETECTED NOT DETECTED Final   Candida krusei NOT DETECTED NOT DETECTED Final   Candida parapsilosis NOT DETECTED NOT DETECTED Final   Candida tropicalis NOT DETECTED NOT DETECTED Final    Comment: Performed at [facility], [address], [place], [place] [address]  Urine culture     Status: Abnormal   Collection Time: 11/25/18 12:03 AM  Result Value Ref Range Status   Specimen  Description   Final    URINE, RANDOM Performed at [facility], [address], [place], [place] [address]    Special Requests   Final    NONE Performed at [facility], [address] [place],  [address]    Culture (A)  Final    30,000 COLONIES/mL MULTIPLE SPECIES PRESENT, SUGGEST RECOLLECTION   Report Status 11/26/2018 FINAL  Final    Studies/Results: Dg Chest Portable 1 View  Result Date: 11/25/2018 CLINICAL DATA:  83 year old female with sepsis. EXAM: PORTABLE CHEST 1 VIEW COMPARISON:  None. FINDINGS: Mild diffuse interstitial coarsening and chronic bronchitic changes. No focal consolidation, pleural effusion, pneumothorax. The cardiac silhouette is within normal limits. No acute osseous pathology. IMPRESSION: No active disease. Electronically Signed   By: Anner Crete M.D.   On: 11/25/2018 00:15   Ct Renal Stone Study  Result Date: 11/25/2018 CLINICAL DATA:  Left flank pain. Urinary tract infection. Leukocytosis, fever, and chills. EXAM: CT ABDOMEN AND PELVIS WITHOUT CONTRAST TECHNIQUE: Multidetector CT imaging of the abdomen and pelvis was performed following the standard protocol  without IV contrast. COMPARISON:  01/27/2016 FINDINGS: Lower chest: No acute findings. Hepatobiliary: No mass visualized on this unenhanced exam. Prior cholecystectomy. No evidence of biliary obstruction. Pancreas: No mass or inflammatory process visualized on this unenhanced exam. Spleen:  Within normal limits in size. Adrenals/Urinary tract: No evidence of urolithiasis or hydronephrosis. Mild asymmetric left perinephric stranding is seen, which may be due to pyelonephritis. Unremarkable unopacified urinary bladder. Stomach/Bowel: No evidence of obstruction, inflammatory process, or abnormal fluid collections. Diverticulosis is seen mainly involving the descending and sigmoid colon, however there is no evidence of diverticulitis. Vascular/Lymphatic: No pathologically enlarged lymph nodes identified. 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 No evidence of abdominal aortic aneurysm. Aortic atherosclerosis. Reproductive:  No mass or other significant abnormality. Other:  None. Musculoskeletal:  No suspicious bone lesions identified. IMPRESSION: Mild asymmetric left perinephric stranding, suspicious for pyelonephritis. No evidence of ureteral calculi or hydronephrosis. Colonic diverticulosis, without radiographic evidence of diverticulitis. Electronically Signed   By: Earle Gell M.D.   On: 11/25/2018 17:03    Assessment/Plan: ALLISON DESHOTELS is a 83 y.o. female admitted with urosepsis and now E coli bacteremia.  Has a hx of recurrent UTI and has seen urology in the past. Currently having some continued L flank pain. 12/14 - clinically improving. CT with mild L pyelonephritis but no stones or obstruction. Recommendations Cont meropenem pending ucx and bcx results Hopefully is sensitive organism can be dced in 2-3 days on oral abx for a 14 day total course. Thank you very much for the consult. Will follow with you.  Leonel Ramsay   11/26/2018, 9:10 AM

## 2018-11-26 NOTE — Progress Notes (Signed)
[facility] [place] NAME: [name]    MR#:  [id number]  DATE OF BIRTH:  1935-04-03  SUBJECTIVE:  CHIEF COMPLAINT:   Chief Complaint  Patient presents with  . Fever   Generalized weakness and urinary frequency. REVIEW OF SYSTEMS:  Review of Systems  Constitutional: Positive for malaise/fatigue. Negative for chills and fever.  HENT: Negative for sore throat.   Eyes: Negative for blurred vision and double vision.  Respiratory: Negative for cough, hemoptysis, shortness of breath, wheezing and stridor.   Cardiovascular: Negative for chest pain, palpitations, orthopnea and leg swelling.  Gastrointestinal: Negative for abdominal pain, blood in stool, diarrhea, melena, nausea and vomiting.  Genitourinary: Positive for frequency. Negative for dysuria, flank pain, hematuria and urgency.  Musculoskeletal: Negative for back pain and joint pain.  Skin: Negative for rash.  Neurological: Negative for dizziness, sensory change, focal weakness, seizures, loss of consciousness, weakness and headaches.  Endo/Heme/Allergies: Negative for polydipsia.  Psychiatric/Behavioral: Negative for depression. The patient is not nervous/anxious.     DRUG ALLERGIES:   Allergies  Allergen Reactions  . Codeine Diarrhea and Nausea And Vomiting  . Septra [Sulfamethoxazole-Trimethoprim] Swelling  . Sulfa Antibiotics Swelling   VITALS:  Blood pressure (!) 159/71, pulse 85, temperature 99.4 F (37.4 C), temperature source Oral, resp. rate 18, height 5\' 4"  (1.626 m), weight 64.2 kg, SpO2 94 %. PHYSICAL EXAMINATION:  Physical Exam Constitutional:      General: She is not in acute distress. HENT:     Head: Normocephalic.     Mouth/Throat:     Mouth: Mucous membranes are moist.  Eyes:     General: No scleral icterus.    Conjunctiva/sclera: Conjunctivae normal.     Pupils: Pupils are equal, round, and reactive to light.  Neck:     Musculoskeletal: Normal  range of motion and neck supple.     Vascular: No JVD.     Trachea: No tracheal deviation.  Cardiovascular:     Rate and Rhythm: Normal rate and regular rhythm.     Heart sounds: Normal heart sounds. No murmur. No gallop.   Pulmonary:     Effort: Pulmonary effort is normal. No respiratory distress.     Breath sounds: Normal breath sounds. No wheezing or rales.  Abdominal:     General: Bowel sounds are normal. There is no distension.     Palpations: Abdomen is soft.     Tenderness: There is no abdominal tenderness. There is no rebound.  Musculoskeletal: Normal range of motion.        General: No tenderness.  Skin:    Findings: No erythema or rash.  Neurological:     Mental Status: She is alert and oriented to person, place, and time.     Cranial Nerves: No cranial nerve deficit.  Psychiatric:        Mood and Affect: Mood normal.    LABORATORY PANEL:  Female CBC Recent Labs  Lab 11/26/18 0319  WBC 8.7  HGB 12.4  HCT 37.9  PLT 161   ------------------------------------------------------------------------------------------------------------------ Chemistries  Recent Labs  Lab 11/24/18 2344  11/26/18 0319  NA 131*   < > 135  K 3.7   < > 3.3*  CL 99   < > 106  CO2 19*   < > 23  GLUCOSE 119*   < > 120*  BUN 13   < > 8  CREATININE 0.73   < > 0.55  CALCIUM 8.6*   < >  7.8*  MG  --   --  1.6*  AST 29  --   --   ALT 16  --   --   ALKPHOS 88  --   --   BILITOT 0.9  --   --    < > = values in this interval not displayed.   RADIOLOGY:  Ct Renal Stone Study  Result Date: 11/25/2018 CLINICAL DATA:  Left flank pain. Urinary tract infection. Leukocytosis, fever, and chills. EXAM: CT ABDOMEN AND PELVIS WITHOUT CONTRAST TECHNIQUE: Multidetector CT imaging of the abdomen and pelvis was performed following the standard protocol without IV contrast. COMPARISON:  01/27/2016 FINDINGS: Lower chest: No acute findings. Hepatobiliary: No mass visualized on this unenhanced exam. Prior  cholecystectomy. No evidence of biliary obstruction. Pancreas: No mass or inflammatory process visualized on this unenhanced exam. Spleen:  Within normal limits in size. Adrenals/Urinary tract: No evidence of urolithiasis or hydronephrosis. Mild asymmetric left perinephric stranding is seen, which may be due to pyelonephritis. Unremarkable unopacified urinary bladder. Stomach/Bowel: No evidence of obstruction, inflammatory process, or abnormal fluid collections. Diverticulosis is seen mainly involving the descending and sigmoid colon, however there is no evidence of diverticulitis. Vascular/Lymphatic: No pathologically enlarged lymph nodes identified. 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 No evidence of abdominal aortic aneurysm. Aortic atherosclerosis. Reproductive:  No mass or other significant abnormality. Other:  None. Musculoskeletal:  No suspicious bone lesions identified. IMPRESSION: Mild asymmetric left perinephric stranding, suspicious for pyelonephritis. No evidence of ureteral calculi or hydronephrosis. Colonic diverticulosis, without radiographic evidence of diverticulitis. Electronically Signed   By: Earle Gell M.D.   On: 11/25/2018 17:03   ASSESSMENT AND PLAN:     Sepsis due to UTI, pyelonephritis and bacteremia Cont meropenem.  Blood culture show E. coli, sensitivities pending.  Urine culture suggested recollection. Leukocytosis improved. CT stone  Protocol to eval for nephrolithiasis or pyelo per Dr. Ola Spurr. CT abd: Mild asymmetric left perinephric stranding, suspicious for pyelonephritis. No evidence of ureteral calculi or hydronephrosis.  Colonic diverticulosis, without radiographic evidence of Diverticulitis.  Hypokalemia and hypomagnesemia.  Potassium and magnesium supplement, follow-up levels. Hyponatremia.  Improved with normal saline IV. Lactic acidosis.  Improved with treatment.   COPD -continue home meds   Diabetes ([place]) -sliding  scale insulin coverage   Anxiety -continue home dose anxiolytic   HLD (hyperlipidemia) -continue home dose antilipid   Hypothyroidism -continue home dose thyroid replacement  All the records are reviewed and case discussed with Care Management/Social Worker. Management plans discussed with the patient, her husband and they are in agreement.  CODE STATUS: Full Code  TOTAL TIME TAKING CARE OF THIS PATIENT: 27 minutes.   More than 50% of the time was spent in counseling/coordination of care: YES  POSSIBLE D/C IN 2-3 DAYS, DEPENDING ON CLINICAL CONDITION.   Demetrios Loll M.D on 11/26/2018 at 2:22 PM  Between 7am to 6pm - Pager - [phone]  After 6pm go to [email or web address] - [facility]

## 2018-11-27 LAB — BASIC METABOLIC PANEL
Anion gap: 9 (ref 5–15)
BUN: 6 mg/dL — ABNORMAL LOW (ref 8–23)
CO2: 24 mmol/L (ref 22–32)
Calcium: 8.4 mg/dL — ABNORMAL LOW (ref 8.9–10.3)
Chloride: 102 mmol/L (ref 98–111)
Creatinine, Ser: 0.63 mg/dL (ref 0.44–1.00)
GFR calc Af Amer: >60 mL/min (ref >60)
GFR calc non Af Amer: >60 mL/min (ref >60)
Glucose, Bld: 130 mg/dL — ABNORMAL HIGH (ref 70–99)
Potassium: 4 mmol/L (ref 3.5–5.1)
Sodium: 135 mmol/L (ref 135–145)

## 2018-11-27 LAB — GLUCOSE, CAPILLARY
GLUCOSE-CAPILLARY: 195 mg/dL — AB (ref 70–99)
Glucose-Capillary: 156 mg/dL — ABNORMAL HIGH (ref 70–99)

## 2018-11-27 LAB — CULTURE, BLOOD (ROUTINE X 2): Special Requests: ADEQUATE

## 2018-11-27 LAB — MAGNESIUM: Magnesium: 1.9 mg/dL (ref 1.7–2.4)

## 2018-11-27 MED ORDER — DOCUSATE SODIUM 100 MG PO CAPS
100.0000 mg | ORAL_CAPSULE | Freq: Two times a day (BID) | ORAL | Status: DC
  Administered 2018-11-27: 100 mg via ORAL
  Filled 2018-11-27: qty 1

## 2018-11-27 MED ORDER — BISACODYL 5 MG PO TBEC
5.0000 mg | DELAYED_RELEASE_TABLET | Freq: Every day | ORAL | Status: DC | PRN
  Filled 2018-11-27: qty 1

## 2018-11-27 MED ORDER — CIPROFLOXACIN HCL 500 MG PO TABS
500.0000 mg | ORAL_TABLET | Freq: Two times a day (BID) | ORAL | Status: DC
  Filled 2018-11-27: qty 1

## 2018-11-27 MED ORDER — CIPROFLOXACIN HCL 500 MG PO TABS: 500.0000 mg | ORAL_TABLET | Freq: Two times a day (BID) | ORAL | 0 refills | Status: AC

## 2018-11-27 MED ORDER — POLYETHYLENE GLYCOL 3350 17 G PO PACK
17.0000 g | PACK | Freq: Every day | ORAL | Status: DC | PRN
  Filled 2018-11-27: qty 1

## 2018-11-27 NOTE — Progress Notes (Signed)
Spoke with Dr. Marcille Blanco Telemetry reporting a 6 beat run of SVT. No orders at this time.

## 2018-11-27 NOTE — Discharge Summary (Signed)
[facility] [place] NAME: [name]    MR#:  [id number]  DATE OF BIRTH:  Apr 28, 1935  DATE OF ADMISSION:  11/24/2018   ADMITTING PHYSICIAN: Lance Coon, MD  DATE OF DISCHARGE: 11/27/2018  PRIMARY CARE PHYSICIAN: Baxter Hire, MD   ADMISSION DIAGNOSIS:  Sepsis without acute organ dysfunction, due to unspecified organism [facility]) [A41.9] DISCHARGE DIAGNOSIS:  Principal Problem:   Sepsis ([place]) Active Problems:   UTI (urinary tract infection)   HLD (hyperlipidemia)   Hypothyroidism   Diabetes ([place])   COPD with acute bronchitis (H[facility])   Anxiety   Malnutrition of moderate degree  SECONDARY DIAGNOSIS:   Past Medical History:  Diagnosis Date  . Breast cancer ([place]) 2016   Right- radiation  . COPD (chronic obstructive pulmonary disease) ([place])   . Diabetes mellitus without complication ([place])   . Hyperlipidemia   . Hypothyroidism   . Osteoporosis   . Psoriasis   . Substance abuse ([place])    tobacco  . Thyroid disease   . Vertigo    x1, several yrs ago  . Wears dentures    partial upper   HOSPITAL COURSE:  Sepsis due to UTI, pyelonephritis and bacteremia The patient has been treated with meropenem.  Blood culture show E. coli, sensitive to Cipro.  Urine culture suggested recollection. Leukocytosis improved. CT stone Protocol to eval for nephrolithiasis or pyelo per Dr. Ola Spurr. CT abd: Mild asymmetric left perinephric stranding, suspicious for pyelonephritis. No evidence of ureteral calculi or hydronephrosis. Cipro 500 mg twice daily for 6 more days per Dr. Caryn Section.  Colonic diverticulosis, without radiographic evidence of Diverticulitis.  Hypokalemia and hypomagnesemia.  Potassium and magnesium supplement, improved. Hyponatremia.  Improved with normal saline IV. Lactic acidosis.  Improved with treatment. COPD -continue home meds Diabetes ([place]) -sliding scale insulin coverage, resume home medication after  discharge. Anxiety -continue home dose anxiolytic HLD (hyperlipidemia) -continue home dose antilipid Hypothyroidism -continue home dose thyroid replacement Discussed with Dr. Ola Spurr. DISCHARGE CONDITIONS:  Stable, discharge to home today. CONSULTS OBTAINED:  Treatment Team:  Leonel Ramsay, MD DRUG ALLERGIES:   Allergies  Allergen Reactions  . Codeine Diarrhea and Nausea And Vomiting  . Septra [Sulfamethoxazole-Trimethoprim] Swelling  . Sulfa Antibiotics Swelling   DISCHARGE MEDICATIONS:   Allergies as of 11/27/2018      Reactions   Codeine Diarrhea, Nausea And Vomiting   Septra [sulfamethoxazole-trimethoprim] Swelling   Sulfa Antibiotics Swelling      Medication List    TAKE these medications   Apremilast 30 MG Tabs Take 1 tablet by mouth 2 (two) times daily.   aspirin 81 MG tablet Take 81 mg by mouth daily.   ciprofloxacin 500 MG tablet Commonly known as:  CIPRO Take 1 tablet (500 mg total) by mouth 2 (two) times daily for 6 days.   clobetasol 0.05 % Gel Commonly known as:  TEMOVATE Apply topically 2 (two) times daily.   diphenhydramine-acetaminophen 25-500 MG Tabs tablet Commonly known as:  TYLENOL PM Take 1 tablet by mouth at bedtime as needed (sleep).   glipiZIDE 10 MG 24 hr tablet Commonly known as:  GLUCOTROL XL Take 10 mg by mouth 2 (two) times daily.   levothyroxine 137 MCG tablet Commonly known as:  SYNTHROID, LEVOTHROID Take 137 mcg by mouth daily before breakfast.   LORazepam 0.5 MG tablet Commonly known as:  ATIVAN Take 0.5 mg by mouth every 8 (eight) hours.   metFORMIN 1000 MG tablet Commonly known as:  GLUCOPHAGE Take 1,000 mg by mouth 2 (two) times daily with a meal.   nitrofurantoin (macrocrystal-monohydrate) 100 MG capsule Commonly known as:  MACROBID Take 100 mg by mouth 2 (two) times daily.   omeprazole 20 MG capsule Commonly known as:  PRILOSEC Take 20 mg by mouth daily.   simvastatin 40 MG tablet Commonly  known as:  ZOCOR Take 40 mg by mouth daily.   VITAMIN C PO Take 500 mg by mouth daily.        DISCHARGE INSTRUCTIONS:  See AVS.  If you experience worsening of your admission symptoms, develop shortness of breath, life threatening emergency, suicidal or homicidal thoughts you must seek medical attention immediately by calling 911 or calling your MD immediately  if symptoms less severe.  You Must read complete instructions/literature along with all the possible adverse reactions/side effects for all the Medicines you take and that have been prescribed to you. Take any new Medicines after you have completely understood and accpet all the possible adverse reactions/side effects.   Please note  You were cared for by a hospitalist during your hospital stay. If you have any questions about your discharge medications or the care you received while you were in the hospital after you are discharged, you can call the unit and asked to speak with the hospitalist on call if the hospitalist that took care of you is not available. Once you are discharged, your primary care physician will handle any further medical issues. Please note that NO REFILLS for any discharge medications will be authorized once you are discharged, as it is imperative that you return to your primary care physician (or establish a relationship with a primary care physician if you do not have one) for your aftercare needs so that they can reassess your need for medications and monitor your lab values.    On the day of Discharge:  VITAL SIGNS:  Blood pressure (!) 147/68, pulse 92, temperature 98.1 F (36.7 C), temperature source Oral, resp. rate 19, height 5\' 4"  (1.626 m), weight 64.2 kg, SpO2 94 %. PHYSICAL EXAMINATION:  GENERAL:  83 y.o.-year-old patient lying in the bed with no acute distress.  EYES: Pupils equal, round, reactive to light and accommodation. No scleral icterus. Extraocular muscles intact.  HEENT: Head  atraumatic, normocephalic. Oropharynx and nasopharynx clear.  NECK:  Supple, no jugular venous distention. No thyroid enlargement, no tenderness.  LUNGS: Normal breath sounds bilaterally, no wheezing, rales,rhonchi or crepitation. No use of accessory muscles of respiration.  CARDIOVASCULAR: S1, S2 normal. No murmurs, rubs, or gallops.  ABDOMEN: Soft, non-tender, non-distended. Bowel sounds present. No organomegaly or mass.  EXTREMITIES: No pedal edema, cyanosis, or clubbing.  NEUROLOGIC: Cranial nerves II through XII are intact. Muscle strength 5/5 in all extremities. Sensation intact. Gait not checked.  PSYCHIATRIC: The patient is alert and oriented x 3.  SKIN: No obvious rash, lesion, or ulcer.  DATA REVIEW:   CBC Recent Labs  Lab 11/26/18 0319  WBC 8.7  HGB 12.4  HCT 37.9  PLT 161    Chemistries  Recent Labs  Lab 11/24/18 2344  11/27/18 0337  NA 131*   < > 135  K 3.7   < > 4.0  CL 99   < > 102  CO2 19*   < > 24  GLUCOSE 119*   < > 130*  BUN 13   < > 6*  CREATININE 0.73   < > 0.63  CALCIUM 8.6*   < > 8.4*  MG  --    < >  1.9  AST 29  --   --   ALT 16  --   --   ALKPHOS 88  --   --   BILITOT 0.9  --   --    < > = values in this interval not displayed.     Microbiology Results  Results for orders placed or performed during the hospital encounter of 11/24/18  Culture, blood (Routine x 2)     Status: Abnormal   Collection Time: 11/24/18 11:44 PM  Result Value Ref Range Status   Specimen Description   Final    BLOOD RIGHT ANTECUBITAL Performed at [facility], [address] [place], [place] [address]    Special Requests   Final    BOTTLES DRAWN AEROBIC AND ANAEROBIC Blood Culture adequate volume Performed at [facility], [address] [place], [place] [address]    Culture  Setup Time   Final    GRAM NEGATIVE RODS IN BOTH AEROBIC AND ANAEROBIC BOTTLES CRITICAL RESULT CALLED TO, READ BACK BY AND VERIFIED WITH: CHRISTINE KATSOUDAS ON  11/25/18 AT 1159 QSD GRAM STAIN REVIEWED-AGREE WITH RESULT Performed at [facility], [place] [address], [place], [place] [address]    Culture ESCHERICHIA COLI (A)  Final   Report Status 11/27/2018 FINAL  Final   Organism ID, Bacteria ESCHERICHIA COLI  Final      Susceptibility   Escherichia coli - MIC*    AMPICILLIN 4 SENSITIVE Sensitive     CEFAZOLIN <=4 SENSITIVE Sensitive     CEFEPIME <=1 SENSITIVE Sensitive     CEFTAZIDIME <=1 SENSITIVE Sensitive     CEFTRIAXONE <=1 SENSITIVE Sensitive     CIPROFLOXACIN <=0.25 SENSITIVE Sensitive     GENTAMICIN <=1 SENSITIVE Sensitive     IMIPENEM <=0.25 SENSITIVE Sensitive     TRIMETH/SULFA <=20 SENSITIVE Sensitive     AMPICILLIN/SULBACTAM <=2 SENSITIVE Sensitive     PIP/TAZO <=4 SENSITIVE Sensitive     Extended ESBL NEGATIVE Sensitive     * ESCHERICHIA COLI  Culture, blood (Routine x 2)     Status: None (Preliminary result)   Collection Time: 11/24/18 11:44 PM  Result Value Ref Range Status   Specimen Description BLOOD LEFT ANTECUBITAL  Final   Special Requests   Final    BOTTLES DRAWN AEROBIC AND ANAEROBIC Blood Culture results may not be optimal due to an excessive volume of blood received in culture bottles   Culture  Setup Time   Final    GRAM NEGATIVE RODS ANAEROBIC BOTTLE ONLY CRITICAL VALUE NOTED.  VALUE IS CONSISTENT WITH PREVIOUSLY REPORTED AND CALLED VALUE. Performed at [facility], [address] [place], [place] [address]    Culture GRAM NEGATIVE RODS  Final   Report Status PENDING  Incomplete  Blood Culture ID Panel (Reflexed)     Status: Abnormal   Collection Time: 11/24/18 11:44 PM  Result Value Ref Range Status   Enterococcus species NOT DETECTED NOT DETECTED Final   Listeria monocytogenes NOT DETECTED NOT DETECTED Final   Staphylococcus species NOT DETECTED NOT DETECTED Final   Staphylococcus aureus (BCID) NOT DETECTED NOT DETECTED Final   Streptococcus species NOT DETECTED NOT DETECTED Final    Streptococcus agalactiae NOT DETECTED NOT DETECTED Final   Streptococcus pneumoniae NOT DETECTED NOT DETECTED Final   Streptococcus pyogenes NOT DETECTED NOT DETECTED Final   Acinetobacter baumannii NOT DETECTED NOT DETECTED Final   Enterobacteriaceae species DETECTED (A) NOT DETECTED Final    Comment: Enterobacteriaceae represent a large  family of gram-negative bacteria, not a single organism. CRITICAL RESULT CALLED TO, READ BACK BY AND VERIFIED WITH: CHRISTINE KATSOUDAS ON 11/25/18 AT 1159 QSD    Enterobacter cloacae complex NOT DETECTED NOT DETECTED Final   Escherichia coli DETECTED (A) NOT DETECTED Final    Comment: CRITICAL RESULT CALLED TO, READ BACK BY AND VERIFIED WITH: CHRISTINE KATSOUDAS ON 11/25/18 AT 1159 QSD    Klebsiella oxytoca NOT DETECTED NOT DETECTED Final   Klebsiella pneumoniae NOT DETECTED NOT DETECTED Final   Proteus species NOT DETECTED NOT DETECTED Final   Serratia marcescens NOT DETECTED NOT DETECTED Final   Carbapenem resistance NOT DETECTED NOT DETECTED Final   Haemophilus influenzae NOT DETECTED NOT DETECTED Final   Neisseria meningitidis NOT DETECTED NOT DETECTED Final   Pseudomonas aeruginosa NOT DETECTED NOT DETECTED Final   Candida albicans NOT DETECTED NOT DETECTED Final   Candida glabrata NOT DETECTED NOT DETECTED Final   Candida krusei NOT DETECTED NOT DETECTED Final   Candida parapsilosis NOT DETECTED NOT DETECTED Final   Candida tropicalis NOT DETECTED NOT DETECTED Final    Comment: Performed at [facility], [address], [place], [place] [address]  Urine culture     Status: Abnormal   Collection Time: 11/25/18 12:03 AM  Result Value Ref Range Status   Specimen Description   Final    URINE, RANDOM Performed at [facility], [address], [place],  [address]    Special Requests   Final    NONE Performed at [facility], [address] [place],  [address]    Culture (A)  Final    30,000  COLONIES/mL MULTIPLE SPECIES PRESENT, SUGGEST RECOLLECTION   Report Status 11/26/2018 FINAL  Final    RADIOLOGY:  No results found.   Management plans discussed with the patient, her husband and they are in agreement.  CODE STATUS: Full Code   TOTAL TIME TAKING CARE OF THIS PATIENT: 33 minutes.    Demetrios Loll M.D on 11/27/2018 at 1:26 PM  Between 7am to 6pm - Pager - [phone]  After 6pm go to [email or web address] - Proofreader  [employer]  [facility]  Office  [phone]  CC: Primary care physician; Baxter Hire, MD   Note: This dictation was prepared with Dragon dictation along with smaller phrase technology. Any transcriptional errors that result from this process are unintentional.

## 2018-11-27 NOTE — Progress Notes (Signed)
[name] INFECTIOUS DISEASE PROGRESS NOTE Date of Admission:  19/50/9326     ID: [name] is a 83 y.o. female with E coli urosepsis Principal Problem:   Sepsis ([name]) Active Problems:   UTI (urinary tract infection)   HLD (hyperlipidemia)   Hypothyroidism   Diabetes ([name])   COPD with acute bronchitis ([name])   Anxiety   Malnutrition of moderate degree   Subjective: No fevers, feels a little stronger  ROS  Eleven systems are reviewed and negative except per hpi  Medications:  Antibiotics Given (last 72 hours)    Date/Time Action Medication Dose Rate   11/24/18 2359 New Bag/Given   ceFEPIme (MAXIPIME) 2 g in sodium chloride 0.9 % 100 mL IVPB 2 g 200 mL/hr   11/25/18 0000 New Bag/Given   vancomycin (VANCOCIN) IVPB 1000 mg/200 mL premix 1,000 mg 200 mL/hr   11/25/18 0001 New Bag/Given   metroNIDAZOLE (FLAGYL) IVPB 500 mg 500 mg 100 mL/hr   11/25/18 0753 New Bag/Given   metroNIDAZOLE (FLAGYL) IVPB 500 mg 500 mg 100 mL/hr   11/25/18 1029 New Bag/Given   vancomycin (VANCOCIN) IVPB 1000 mg/200 mL premix 1,000 mg 200 mL/hr   11/25/18 1337 New Bag/Given   meropenem (MERREM) 1 g in sodium chloride 0.9 % 100 mL IVPB 1 g 200 mL/hr   11/25/18 2204 New Bag/Given   meropenem (MERREM) 1 g in sodium chloride 0.9 % 100 mL IVPB 1 g 200 mL/hr   11/26/18 7124 New Bag/Given   meropenem (MERREM) 1 g in sodium chloride 0.9 % 100 mL IVPB 1 g 200 mL/hr   11/26/18 2113 New Bag/Given   meropenem (MERREM) 1 g in sodium chloride 0.9 % 100 mL IVPB 1 g 200 mL/hr   11/27/18 0936 New Bag/Given   meropenem (MERREM) 1 g in sodium chloride 0.9 % 100 mL IVPB 1 g 200 mL/hr     . aspirin  81 mg Oral Daily  . docusate sodium  100 mg Oral BID  . enoxaparin (LOVENOX) injection  40 mg Subcutaneous Q24H  . feeding supplement (GLUCERNA SHAKE)  237 mL Oral BID BM  . insulin aspart  0-5 Units Subcutaneous QHS  . insulin aspart  0-9 Units Subcutaneous TID WC  . levothyroxine  137 mcg Oral QAC breakfast   . multivitamin with minerals  1 tablet Oral Daily  . pantoprazole  40 mg Oral Daily  . simvastatin  40 mg Oral Daily    Objective: Vital signs in last 24 hours: Temp:  [97.7 F (36.5 C)-98.4 F (36.9 C)] 98.1 F (36.7 C) (12/15 0732) Pulse Rate:  [75-92] 92 (12/15 0732) Resp:  [19] 19 (12/14 2200) BP: (124-147)/(68-99) 147/68 (12/15 0732) SpO2:  [94 %-96 %] 94 % (12/15 0732) Constitutional:  oriented to person, place, and time. appears well-developed and well-nourished. No distress.  HENT: [place]/AT, PERRLA, no scleral icterus Mouth/Throat: Oropharynx is clear and moist. No oropharyngeal exudate.  Cardiovascular: Normal rate, regular rhythm and normal heart sounds. Exam reveals no gallop and no friction rub.  No murmur heard.  Pulmonary/Chest: Effort normal and breath sounds normal. No respiratory distress.  has no wheezes.  Neck = supple, no nuchal rigidity Abdominal: Soft. Bowel sounds are normal.  exhibits no distension. There is no tenderness.  Lymphadenopathy: no cervical adenopathy. No axillary adenopathy Neurological: alert and oriented to person, place, and time.  Skin: Skin is warm and dry. No rash noted. No erythema.  Psychiatric: a normal mood and affect.  behavior is normal.  [name] Results Recent Labs    11/25/18 0530 11/26/18 0319 11/27/18 0337  WBC 20.2* 8.7  --   HGB 12.4 12.4  --   HCT 38.1 37.9  --   NA 133* 135 135  K 3.7 3.3* 4.0  CL 103 106 102  CO2 22 23 24   BUN 11 8 6*  CREATININE 0.61 0.55 0.63    Microbiology: Results for orders placed or performed during the [name] encounter of 11/24/18  Culture, blood (Routine x 2)     Status: Abnormal   Collection Time: 11/24/18 11:44 PM  Result Value Ref Range Status   Specimen Description   Final    BLOOD RIGHT ANTECUBITAL Performed at [name], [address], [name], [name] [address]    Special Requests   Final    BOTTLES DRAWN AEROBIC AND ANAEROBIC Blood Culture adequate  volume Performed at [name], [address] [place], [place] [address]    Culture  Setup Time   Final    GRAM NEGATIVE RODS IN BOTH AEROBIC AND ANAEROBIC BOTTLES CRITICAL RESULT CALLED TO, READ BACK BY AND VERIFIED WITH: CHRISTINE KATSOUDAS ON 11/25/18 AT 1159 QSD GRAM STAIN REVIEWED-AGREE WITH RESULT Performed at [name], [place] [address], [place], [place] [address]    Culture ESCHERICHIA COLI (A)  Final   Report Status 11/27/2018 FINAL  Final   Organism ID, Bacteria ESCHERICHIA COLI  Final      Susceptibility   Escherichia coli - MIC*    AMPICILLIN 4 SENSITIVE Sensitive     CEFAZOLIN <=4 SENSITIVE Sensitive     CEFEPIME <=1 SENSITIVE Sensitive     CEFTAZIDIME <=1 SENSITIVE Sensitive     CEFTRIAXONE <=1 SENSITIVE Sensitive     CIPROFLOXACIN <=0.25 SENSITIVE Sensitive     GENTAMICIN <=1 SENSITIVE Sensitive     IMIPENEM <=0.25 SENSITIVE Sensitive     TRIMETH/SULFA <=20 SENSITIVE Sensitive     AMPICILLIN/SULBACTAM <=2 SENSITIVE Sensitive     PIP/TAZO <=4 SENSITIVE Sensitive     Extended ESBL NEGATIVE Sensitive     * ESCHERICHIA COLI  Culture, blood (Routine x 2)     Status: None (Preliminary result)   Collection Time: 11/24/18 11:44 PM  Result Value Ref Range Status   Specimen Description BLOOD LEFT ANTECUBITAL  Final   Special Requests   Final    BOTTLES DRAWN AEROBIC AND ANAEROBIC Blood Culture results may not be optimal due to an excessive volume of blood received in culture bottles   Culture  Setup Time   Final    GRAM NEGATIVE RODS ANAEROBIC BOTTLE ONLY CRITICAL VALUE NOTED.  VALUE IS CONSISTENT WITH PREVIOUSLY REPORTED AND CALLED VALUE. Performed at [name], [address] [place],  [address]    Culture GRAM NEGATIVE RODS  Final   Report Status PENDING  Incomplete  Blood Culture ID Panel (Reflexed)     Status: Abnormal   Collection Time: 11/24/18 11:44 PM  Result Value Ref Range Status   Enterococcus species NOT DETECTED  NOT DETECTED Final   Listeria monocytogenes NOT DETECTED NOT DETECTED Final   Staphylococcus species NOT DETECTED NOT DETECTED Final   Staphylococcus aureus (BCID) NOT DETECTED NOT DETECTED Final   Streptococcus species NOT DETECTED NOT DETECTED Final   Streptococcus agalactiae NOT DETECTED NOT DETECTED Final   Streptococcus pneumoniae NOT DETECTED NOT DETECTED Final   Streptococcus pyogenes NOT DETECTED NOT DETECTED Final   Acinetobacter baumannii NOT DETECTED NOT DETECTED Final   Enterobacteriaceae species  DETECTED (A) NOT DETECTED Final    Comment: Enterobacteriaceae represent a large family of gram-negative bacteria, not a single organism. CRITICAL RESULT CALLED TO, READ BACK BY AND VERIFIED WITH: CHRISTINE KATSOUDAS ON 11/25/18 AT 1159 QSD    Enterobacter cloacae complex NOT DETECTED NOT DETECTED Final   Escherichia coli DETECTED (A) NOT DETECTED Final    Comment: CRITICAL RESULT CALLED TO, READ BACK BY AND VERIFIED WITH: CHRISTINE KATSOUDAS ON 11/25/18 AT 1159 QSD    Klebsiella oxytoca NOT DETECTED NOT DETECTED Final   Klebsiella pneumoniae NOT DETECTED NOT DETECTED Final   Proteus species NOT DETECTED NOT DETECTED Final   Serratia marcescens NOT DETECTED NOT DETECTED Final   Carbapenem resistance NOT DETECTED NOT DETECTED Final   Haemophilus influenzae NOT DETECTED NOT DETECTED Final   Neisseria meningitidis NOT DETECTED NOT DETECTED Final   Pseudomonas aeruginosa NOT DETECTED NOT DETECTED Final   Candida albicans NOT DETECTED NOT DETECTED Final   Candida glabrata NOT DETECTED NOT DETECTED Final   Candida krusei NOT DETECTED NOT DETECTED Final   Candida parapsilosis NOT DETECTED NOT DETECTED Final   Candida tropicalis NOT DETECTED NOT DETECTED Final    Comment: Performed at [name], [address], [place], [place] [address]  Urine culture     Status: Abnormal   Collection Time: 11/25/18 12:03 AM  Result Value Ref Range Status   Specimen Description   Final     URINE, RANDOM Performed at [name], [address] [place], [place] [address]    Special Requests   Final    NONE Performed at [name], [address] [place], [place] [address]    Culture (A)  Final    30,000 COLONIES/mL MULTIPLE SPECIES PRESENT, SUGGEST RECOLLECTION   Report Status 11/26/2018 FINAL  Final    Studies/Results: Ct Renal Stone Study  Result Date: 11/25/2018 CLINICAL DATA:  Left flank pain. Urinary tract infection. Leukocytosis, fever, and chills. EXAM: CT ABDOMEN AND PELVIS WITHOUT CONTRAST TECHNIQUE: Multidetector CT imaging of the abdomen and pelvis was performed following the standard protocol without IV contrast. COMPARISON:  01/27/2016 FINDINGS: Lower chest: No acute findings. Hepatobiliary: No mass visualized on this unenhanced exam. Prior cholecystectomy. No evidence of biliary obstruction. Pancreas: No mass or inflammatory process visualized on this unenhanced exam. Spleen:  Within normal limits in size. Adrenals/Urinary tract: No evidence of urolithiasis or hydronephrosis. Mild asymmetric left perinephric stranding is seen, which may be due to pyelonephritis. Unremarkable unopacified urinary bladder. Stomach/Bowel: No evidence of obstruction, inflammatory process, or abnormal fluid collections. Diverticulosis is seen mainly involving the descending and sigmoid colon, however there is no evidence of diverticulitis. Vascular/Lymphatic: No pathologically enlarged lymph nodes identified. 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 No evidence of abdominal aortic aneurysm. Aortic atherosclerosis. Reproductive:  No mass or other significant abnormality. Other:  None. Musculoskeletal:  No suspicious bone lesions identified. IMPRESSION: Mild asymmetric left perinephric stranding, suspicious for pyelonephritis. No evidence of ureteral calculi or hydronephrosis. Colonic diverticulosis, without radiographic evidence of  diverticulitis. Electronically Signed   By: Earle Gell M.D.   On: 11/25/2018 17:03    Assessment/Plan: [name] is a 83 y.o. female admitted with urosepsis and now E coli bacteremia.  Has a hx of recurrent UTI and has seen urology in the past. Currently having some continued L flank pain. 12/14 - clinically improving. CT with mild L pyelonephritis but no stones or obstruction. 12/15 no fevers, feels better.  Recommendations Can change  to oral ciprofloxacin 500 mg bid for 10 days total of abx - stop date 12/21 Discussed with husband, patient and Dr Bridgett Larsson Thank you very much for the consult. Will follow with you.  Leonel Ramsay   11/27/2018, 12:58 PM

## 2018-11-27 NOTE — Progress Notes (Signed)
DISCHARGE NOTE:  Pt given discharge instructions and script (cipro). Pt verbalized understanding. Pt wheeled to car by staff.

## 2018-11-28 LAB — CULTURE, BLOOD (ROUTINE X 2)

## 2019-02-14 ENCOUNTER — Encounter: Payer: Medicare Other | Attending: Physician Assistant | Admitting: Physician Assistant

## 2019-02-14 DIAGNOSIS — Z882 Allergy status to sulfonamides status: Secondary | ICD-10-CM | POA: Diagnosis not present

## 2019-02-14 DIAGNOSIS — I10 Essential (primary) hypertension: Secondary | ICD-10-CM | POA: Insufficient documentation

## 2019-02-14 DIAGNOSIS — S51811A Laceration without foreign body of right forearm, initial encounter: Secondary | ICD-10-CM | POA: Diagnosis not present

## 2019-02-14 DIAGNOSIS — E785 Hyperlipidemia, unspecified: Secondary | ICD-10-CM | POA: Insufficient documentation

## 2019-02-14 DIAGNOSIS — E11622 Type 2 diabetes mellitus with other skin ulcer: Secondary | ICD-10-CM | POA: Insufficient documentation

## 2019-02-14 DIAGNOSIS — I890 Lymphedema, not elsewhere classified: Secondary | ICD-10-CM | POA: Diagnosis not present

## 2019-02-14 DIAGNOSIS — S81801A Unspecified open wound, right lower leg, initial encounter: Secondary | ICD-10-CM | POA: Diagnosis present

## 2019-02-14 DIAGNOSIS — J449 Chronic obstructive pulmonary disease, unspecified: Secondary | ICD-10-CM | POA: Insufficient documentation

## 2019-02-14 DIAGNOSIS — X58XXXA Exposure to other specified factors, initial encounter: Secondary | ICD-10-CM | POA: Diagnosis not present

## 2019-02-14 DIAGNOSIS — F17210 Nicotine dependence, cigarettes, uncomplicated: Secondary | ICD-10-CM | POA: Insufficient documentation

## 2019-02-14 DIAGNOSIS — Z8249 Family history of ischemic heart disease and other diseases of the circulatory system: Secondary | ICD-10-CM | POA: Insufficient documentation

## 2019-02-14 DIAGNOSIS — E039 Hypothyroidism, unspecified: Secondary | ICD-10-CM | POA: Diagnosis not present

## 2019-02-14 DIAGNOSIS — L409 Psoriasis, unspecified: Secondary | ICD-10-CM | POA: Diagnosis not present

## 2019-02-15 NOTE — Progress Notes (Signed)
[name], [name] ([id number]) Visit Report for 02/14/2019 Abuse/Suicide Risk Screen Details Patient Name: [name], [name]. Date of Service: 02/14/2019 10:15 AM Medical Record Number: [id number] Patient Account Number: [id number] Date of Birth/Sex: Feb 28, 1935 (84 y.o. F) Treating RN: Cornell Barman Primary Care Devante Capano: Harrel Lemon Other Clinician: Referring Teighlor Korson: Referral, Self Treating Onesti Bonfiglio/Extender: Melburn Hake, HOYT Weeks in Treatment: 0 Abuse/Suicide Risk Screen Items Answer ABUSE/SUICIDE RISK SCREEN: Has anyone close to you tried to hurt or harm you recentlyo No Do you feel uncomfortable with anyone in your familyo No Has anyone forced you do things that you didnot want to doo No Do you have any thoughts of harming yourselfo No Patient displays signs or symptoms of abuse and/or neglect. No Electronic Signature(s) Signed: 02/14/2019 5:58:39 PM By: Gretta Cool, BSN, RN, CWS, Kim RN, BSN Entered By: Gretta Cool, BSN, RN, CWS, Kim on 02/14/2019 18:56:31 [name] ([id number]) -------------------------------------------------------------------------------- Activities of Daily Living Details Patient Name: [name], [name]. Date of Service: 02/14/2019 10:15 AM Medical Record Number: [id number] Patient Account Number: [id number] Date of Birth/Sex: May 14, 1935 (84 y.o. F) Treating RN: Cornell Barman Primary Care Caz Weaver: Harrel Lemon Other Clinician: Referring Heinrich Fertig: Referral, Self Treating Millee Denise/Extender: Melburn Hake, HOYT Weeks in Treatment: 0 Activities of Daily Living Items Answer Activities of Daily Living (Please select one for each item) Drive Automobile Completely Able Take Medications Completely Able Use Telephone Completely Able Care for Appearance Completely Able Use Toilet Completely Able Bath / Shower Completely Able Dress Self Completely Able Feed Self Completely Able Walk Completely Able Get In / Out Bed Completely Able Housework Completely Able Prepare Meals  Completely Able Handle Money Completely Able Shop for Self Completely Able Electronic Signature(s) Signed: 02/14/2019 5:58:39 PM By: Gretta Cool, BSN, RN, CWS, Kim RN, BSN Entered By: Gretta Cool, BSN, RN, CWS, Kim on 02/14/2019 12:87:86 Channell, [name] ([id number]) -------------------------------------------------------------------------------- Education Assessment Details Patient Name: [name], [name]. Date of Service: 02/14/2019 10:15 AM Medical Record Number: [id number] Patient Account Number: [id number] Date of Birth/Sex: [birth year] (84 y.o. F) Treating RN: Cornell Barman Primary Care Estha Few: Harrel Lemon Other Clinician: Referring Patrik Turnbaugh: Referral, Self Treating Chassity Ludke/Extender: Sharalyn Ink in Treatment: 0 Learning Preferences/Education Level/Primary Language Learning Preference: Explanation Highest Education Level: College or Above Preferred Language: English Cognitive Barrier Assessment/Beliefs Language Barrier: No Translator Needed: No Memory Deficit: No Emotional Barrier: No Cultural/Religious Beliefs Affecting Medical Care: No Physical Barrier Assessment Impaired Vision: Yes Glasses Impaired Hearing: No Decreased Hand dexterity: No Knowledge/Comprehension Assessment Knowledge Level: High Comprehension Level: High Ability to understand written High instructions: Ability to understand verbal High instructions: Motivation Assessment Anxiety Level: Calm Cooperation: Cooperative Education Importance: Acknowledges Need Interest in Health Problems: Asks Questions Perception: Coherent Willingness to Engage in Self- High Management Activities: Readiness to Engage in Self- High Management Activities: Electronic Signature(s) Signed: 02/14/2019 5:58:39 PM By: Gretta Cool, BSN, RN, CWS, Kim RN, BSN Entered By: Gretta Cool, BSN, RN, CWS, Kim on 02/14/2019 47:65:46 [name] ([id number]) -------------------------------------------------------------------------------- Fall  Risk Assessment Details Patient Name: [name] Date of Service: 02/14/2019 10:15 AM Medical Record Number: [id number] Patient Account Number: [id number] Date of Birth/Sex: 26-May-1935 (84 y.o. F) Treating RN: Cornell Barman Primary Care Monty Mccarrell: Harrel Lemon Other Clinician: Referring Dawood Spitler: Referral, Self Treating Chevez Sambrano/Extender: Melburn Hake, HOYT Weeks in Treatment: 0 Fall Risk Assessment Items Have you had 2 or more falls in the last 12 monthso 0 Yes Have you had any fall that resulted in injury in the last 12 monthso 0 Yes FALL RISK ASSESSMENT: History of falling - immediate  or within 3 months 0 No Secondary diagnosis 0 No Ambulatory aid None/bed rest/wheelchair/nurse 0 Yes Crutches/cane/walker 0 No Furniture 0 No IV Access/Saline Lock 0 No Gait/Training Normal/bed rest/immobile 0 Yes Weak 0 No Impaired 0 No Mental Status Oriented to own ability 0 Yes Electronic Signature(s) Signed: 02/14/2019 5:58:39 PM By: Gretta Cool, BSN, RN, CWS, Kim RN, BSN Entered By: Gretta Cool, BSN, RN, CWS, Kim on 02/14/2019 33:54:56 [name], [name] ([id number]) -------------------------------------------------------------------------------- Foot Assessment Details Patient Name: [name], [name] Date of Service: 02/14/2019 10:15 AM Medical Record Number: [id number] Patient Account Number: [id number] Date of Birth/Sex: 25-Mar-1935 (84 y.o. F) Treating RN: Cornell Barman Primary Care Drayson Dorko: Harrel Lemon Other Clinician: Referring Annelise Mccoy: Referral, Self Treating Aneta Hendershott/Extender: Melburn Hake, HOYT Weeks in Treatment: 0 Foot Assessment Items Site Locations + = Sensation present, - = Sensation absent, C = Callus, U = Ulcer R = Redness, W = Warmth, M = Maceration, PU = Pre-ulcerative lesion F = Fissure, S = Swelling, D = Dryness Assessment Right: Left: Other Deformity: No No Prior Foot Ulcer: No No Prior Amputation: No No Charcot Joint: No No Ambulatory Status: Ambulatory Without Help Gait:  Steady Electronic Signature(s) Signed: 02/14/2019 5:58:39 PM By: Gretta Cool, BSN, RN, CWS, Kim RN, BSN Entered By: Gretta Cool, BSN, RN, CWS, Kim on 02/14/2019 72:62:03 [name], [name] ([id number]) -------------------------------------------------------------------------------- Nutrition Risk Assessment Details Patient Name: [name], [name] Date of Service: 02/14/2019 10:15 AM Medical Record Number: [id number] Patient Account Number: [id number] Date of Birth/Sex: [birth year] (84 y.o. F) Treating RN: Cornell Barman Primary Care Rosalynd Mcwright: Harrel Lemon Other Clinician: Referring Verbon Giangregorio: Referral, Self Treating Lanise Mergen/Extender: Melburn Hake, HOYT Weeks in Treatment: 0 Height (in): 64 Weight (lbs): 142 Body Mass Index (BMI): 24.4 Nutrition Risk Assessment Items NUTRITION RISK SCREEN: I have an illness or condition that made me change the kind and/or amount of 0 No food I eat I eat fewer than two meals per day 0 No I eat few fruits and vegetables, or milk products 0 No I have three or more drinks of beer, liquor or wine almost every day 0 No I have tooth or mouth problems that make it hard for me to eat 0 No I don't always have enough money to buy the food I need 0 No I eat alone most of the time 0 No I take three or more different prescribed or over-the-counter drugs a day 0 No Without wanting to, I have lost or gained 10 pounds in the last six months 0 No I am not always physically able to shop, cook and/or feed myself 0 No Nutrition Protocols Good Risk Protocol Provide education on elevated blood sugars and Moderate Risk Protocol 0 impact on wound healing, as applicable Notes Patient states she is not eating well. She has no appetite. Electronic Signature(s) Signed: 02/14/2019 5:58:39 PM By: Gretta Cool, BSN, RN, CWS, Kim RN, BSN Entered By: Gretta Cool, BSN, RN, CWS, Kim on 02/14/2019 10:40:12

## 2019-02-17 NOTE — Progress Notes (Signed)
MEGGIN, OLA ([id number]) Visit Report for 02/14/2019 Allergy List Details Patient Name: [name], [name]. Date of Service: 02/14/2019 10:15 AM Medical Record Number: [id number] Patient Account Number: [id number] Date of Birth/Sex: Jun 15, 1935 (84 y.o. F) Treating RN: [name] Primary Care [name]: [name] Other Clinician: Referring [name]: [name] Treating [name]/Extender: [name], [name] Weeks in Treatment: 0 Allergies Active Allergies Sulfa (Sulfonamide Antibiotics) Allergy Notes Electronic Signature(s) Signed: 02/14/2019 5:58:39 PM By: [name], BSN, RN, CWS, Kim RN, BSN Entered By: [name], BSN, RN, CWS, [name] on 02/14/2019 96:29:52 DeBary, [name] ([id number]) -------------------------------------------------------------------------------- Arrival Information Details Patient Name: [name] Date of Service: 02/14/2019 10:15 AM Medical Record Number: [id number] Patient Account Number: [id number] Date of Birth/Sex: 16-May-1935 (84 y.o. F) Treating RN: [name] Primary Care Lakendrick Paradis: [name] Other Clinician: Referring [name]: [name] Treating [name]/Extender: [name], [name] Weeks in Treatment: 0 Visit Information Patient Arrived: Ambulatory Arrival Time: 10:33 Accompanied By: spouse Transfer Assistance: None Patient Identification Verified: Yes Secondary Verification Process Yes Completed: Patient Requires Transmission-Based No Precautions: Patient Has Alerts: Yes Patient Alerts: Patient on Blood Thinner Aspirin 81 mg DM II ABI: (L)1.13 (R) 1.19 History Since Last Visit Electronic Signature(s) Signed: 02/14/2019 5:58:39 PM By: [name], BSN, RN, CWS, Kim RN, BSN Entered By: [name], BSN, RN, CWS, [name] on 02/14/2019 40:34:74 [name], [name] ([id number]) -------------------------------------------------------------------------------- Clinic Level of Care Assessment Details Patient Name: [name], [name]. Date of Service: 02/14/2019 10:15  AM Medical Record Number: [id number] Patient Account Number: [id number] Date of Birth/Sex: 02-Dec-1935 (84 y.o. F) Treating RN: [name] Primary Care [name]: [name] Other Clinician: Referring [name]: [name] Treating [name]/Extender: [name], [name] Weeks in Treatment: 0 Clinic Level of Care Assessment Items TOOL 1 Quantity Score []  - Use when EandM and Procedure is performed on INITIAL visit 0 ASSESSMENTS - Nursing Assessment / Reassessment X - General Physical Exam (combine w/ comprehensive assessment (listed just below) when 1 20 performed on new pt. evals) X- 1 25 Comprehensive Assessment (HX, ROS, Risk Assessments, Wounds Hx, etc.) ASSESSMENTS - Wound and Skin Assessment / Reassessment []  - Dermatologic / Skin Assessment (not related to wound area) 0 ASSESSMENTS - Ostomy and/or Continence Assessment and Care []  - Incontinence Assessment and Management 0 []  - 0 Ostomy Care Assessment and Management (repouching, etc.) PROCESS - Coordination of Care X - Simple Patient / Family Education for ongoing care 1 15 []  - 0 Complex (extensive) Patient / Family Education for ongoing care X- 1 10 Staff obtains Programmer, systems, Records, Test Results / Process Orders []  - 0 Staff telephones HHA, Nursing Homes / Clarify orders / etc []  - 0 Routine Transfer to another Facility (non-emergent condition) []  - 0 Routine Hospital Admission (non-emergent condition) X- 1 15 New Admissions / Biomedical engineer / Ordering NPWT, Apligraf, etc. []  - 0 Emergency Hospital Admission (emergent condition) PROCESS - Special Needs []  - Pediatric / Minor Patient Management 0 []  - 0 Isolation Patient Management []  - 0 Hearing / Language / Visual special needs []  - 0 Assessment of Community assistance (transportation, D/C planning, etc.) []  - 0 Additional assistance / Altered mentation []  - 0 Support Surface(s) Assessment (bed, cushion, seat, etc.) [name], [name]  ([id number]) INTERVENTIONS - Miscellaneous []  - External ear exam 0 []  - 0 Patient Transfer (multiple staff / Civil Service fast streamer / Similar devices) []  - 0 Simple Staple / Suture removal (25 or less) []  - 0 Complex Staple / Suture removal (26 or more) []  - 0 Hypo/Hyperglycemic Management (do not  check if billed separately) []  - 0 Ankle / Brachial Index (ABI) - do not check if billed separately Has the patient been seen at the hospital within the last three years: Yes Total Score: 85 Level Of Care: New/Established - Level 3 Electronic Signature(s) Signed: 02/14/2019 5:24:46 PM By: [name] Entered By: [name] on 02/14/2019 94:85:46 [name], [name] ([id number]) -------------------------------------------------------------------------------- Compression Therapy Details Patient Name: [name]. Date of Service: 02/14/2019 10:15 AM Medical Record Number: [id number] Patient Account Number: [id number] Date of Birth/Sex: 04-Aug-1935 (84 y.o. F) Treating RN: [name] Primary Care [name]: [name] Other Clinician: Referring [name]: [name] Treating [name]/Extender: [name], [name] Weeks in Treatment: 0 Compression Therapy Performed for Wound Assessment: Wound #3 Right,Lateral Lower Leg Performed By: Clinician Deborah Hora, RN Compression Type: Three Layer Pre Treatment ABI: 1.2 Post Procedure Diagnosis Same as Pre-procedure Electronic Signature(s) Signed: 02/14/2019 5:24:46 PM By: [name] Entered By: [name] on 02/14/2019 69:67:89 [place], [name] ([id number]) -------------------------------------------------------------------------------- Lower Extremity Assessment Details Patient Name: [name], [name] Date of Service: 02/14/2019 10:15 AM Medical Record Number: [id number] Patient Account Number: [id number] Date of Birth/Sex: August 28, 1935 (84 y.o. F) Treating RN: [name] Primary Care [name]: [name] Other Clinician: Referring  [name]: [name] Treating Tamy Accardo/Extender: [name], [name] Weeks in Treatment: 0 Edema Assessment Assessed: [Left: No] [Right: No] [Left: Edema] [Right: :] Calf Left: Right: Point of Measurement: 30 cm From Medial Instep cm 31.6 cm Ankle Left: Right: Point of Measurement: 10 cm From Medial Instep cm 30.5 cm Vascular Assessment Pulses: Dorsalis Pedis Palpable: [Right:Yes] Posterior Tibial Extremity colors, hair growth, and conditions: Extremity Color: [Right:Normal] Hair Growth on Extremity: [Right:No] Temperature of Extremity: [Right:Warm] Capillary Refill: [Right:< 3 seconds] Toe Nail Assessment Left: Right: Thick: No Discolored: No Deformed: No Improper Length and Hygiene: No Notes Ankle Brachial Index: 01/14/18 (L) 1.13 (R) 1.19 Electronic Signature(s) Signed: 02/14/2019 5:58:39 PM By: [name], BSN, RN, CWS, Kim RN, BSN Entered By: [name], BSN, RN, CWS, [name] on 02/14/2019 42:35:36 [name], [name] ([id number]) -------------------------------------------------------------------------------- Multi Wound Chart Details Patient Name: [name]. Date of Service: 02/14/2019 10:15 AM Medical Record Number: [id number] Patient Account Number: [id number] Date of Birth/Sex: Aug 23, 1935 (84 y.o. F) Treating RN: [name] Primary Care Octavious Zidek: [name] Other Clinician: Referring Maylene Crocker: [name] Treating Baili Stang/Extender: STONE III, [name] Weeks in Treatment: 0 Vital Signs Height(in): 64 Pulse(bpm): 71 Weight(lbs): 136.1 Blood Pressure(mmHg): 145/61 Body Mass Index(BMI): 23 Temperature(F): 98.2 Respiratory Rate 16 (breaths/min): Photos: [3:No Photos] [N/A:N/A] Wound Location: [3:Right Lower Leg - Lateral] [N/A:N/A] Wounding Event: [3:Trauma] [N/A:N/A] Primary Etiology: [3:Diabetic Wound/Ulcer of the Lower Extremity] [N/A:N/A] Comorbid History: [3:Cataracts, Anemia, Chronic Obstructive Pulmonary Disease (COPD), Type II Diabetes, Received  Radiation] [N/A:N/A] Date Acquired: [3:02/09/2019] [N/A:N/A] Weeks of Treatment: [3:0] [N/A:N/A] Wound Status: [3:Open] [N/A:N/A] Measurements L x W x D [3:1.1x1.5x0.1] [N/A:N/A] (cm) Area (cm) : [3:1.296] [N/A:N/A] Volume (cm) : [3:0.13] [N/A:N/A] % Reduction in Area: [3:0.00%] [N/A:N/A] % Reduction in Volume: [3:0.00%] [N/A:N/A] Classification: [3:Grade 2] [N/A:N/A] Exudate Amount: [3:Medium] [N/A:N/A] Exudate Type: [3:Serosanguineous] [N/A:N/A] Exudate Color: [3:red, brown] [N/A:N/A] Wound Margin: [3:Flat and Intact] [N/A:N/A] Granulation Amount: [3:Medium (34-66%)] [N/A:N/A] Granulation Quality: [3:Red] [N/A:N/A] Necrotic Amount: [3:Medium (34-66%)] [N/A:N/A] Exposed Structures: [3:Fat Layer (Subcutaneous Tissue) Exposed: Yes Fascia: No Tendon: No Muscle: No Joint: No Bone: No] [N/A:N/A] Epithelialization: [3:None] [N/A:N/A] Periwound Skin Texture: [3:Excoriation: No Induration: No Callus: No] [N/A:N/A] Crepitus: No Rash: No Scarring: No Periwound Skin Moisture: Maceration: No N/A N/A Dry/Scaly: No Periwound Skin Color: Atrophie Blanche: No N/A  N/A Cyanosis: No Ecchymosis: No Erythema: No Hemosiderin Staining: No Mottled: No Pallor: No Rubor: No Tenderness on Palpation: No N/A N/A Wound Preparation: Ulcer Cleansing: N/A N/A Rinsed/Irrigated with Saline Topical Anesthetic Applied: None Treatment Notes Electronic Signature(s) Signed: 02/14/2019 5:24:46 PM By: [name] Entered By: [name] on 02/14/2019 98:92:11 [name], [name] ([id number]) -------------------------------------------------------------------------------- Multi-Disciplinary Care Plan Details Patient Name: [name]. Date of Service: 02/14/2019 10:15 AM Medical Record Number: [id number] Patient Account Number: [id number] Date of Birth/Sex: 1935/03/13 (84 y.o. F) Treating RN: [name] Primary Care Tressa Maldonado: [name] Other Clinician: Referring Sindee Stucker: Referral,  [name] Treating Festus Pursel/Extender: [name], [name] Weeks in Treatment: 0 Active Inactive Abuse / Safety / Falls / [name] Care Management Nursing Diagnoses: Potential for falls Goals: Patient will not experience any injury related to falls Date Initiated: 02/14/2019 Target Resolution Date: 05/20/2019 Goal Status: Active Interventions: Assess fall risk on admission and as needed Notes: Orientation to the Wound Care Program Nursing Diagnoses: Knowledge deficit related to the wound healing center program Goals: Patient/caregiver will verbalize understanding of the [facility] Date Initiated: 02/14/2019 Target Resolution Date: 05/20/2019 Goal Status: Active Interventions: Provide education on orientation to the wound center Notes: Wound/Skin Impairment Nursing Diagnoses: Impaired tissue integrity Goals: Ulcer/skin breakdown will heal within 14 weeks Date Initiated: 02/14/2019 Target Resolution Date: 05/20/2019 Goal Status: Active Interventions: Assess patient/caregiver ability to obtain necessary supplies [name], [name] ([id number]) Assess patient/caregiver ability to perform ulcer/skin care regimen upon admission and as needed Assess ulceration(s) every visit Notes: Electronic Signature(s) Signed: 02/14/2019 5:24:46 PM By: [name] Entered By: [name] on 02/14/2019 88:50:27 [name], [name] ([id number]) -------------------------------------------------------------------------------- Pain Assessment Details Patient Name: [name]. Date of Service: 02/14/2019 10:15 AM Medical Record Number: [id number] Patient Account Number: [id number] Date of Birth/Sex: [birth year] (84 y.o. F) Treating RN: [name] Primary Care Samarrah Tranchina: [name] Other Clinician: Referring Quintan Saldivar: [name] Treating Margarete Horace/Extender: [name], [name] Weeks in Treatment: 0 Active Problems Location of Pain Severity and Description of Pain Patient Has Paino No Site  Locations Pain Management and Medication Current Pain Management: Notes patient denies any pain at this time. States she has sharp pains periodically. Electronic Signature(s) Signed: 02/14/2019 5:58:39 PM By: [name], BSN, RN, CWS, Kim RN, BSN Entered By: [name], BSN, RN, CWS, [name] on 02/14/2019 62:83:66 [name], [name] ([id number]) -------------------------------------------------------------------------------- Patient/Caregiver Education Details Patient Name: CAHTERINE, HEINZEL. Date of Service: 02/14/2019 10:15 AM Medical Record Number: [id number] Patient Account Number: [id number] Date of Birth/Gender: 1935-03-24 (84 y.o. F) Treating RN: [name] Primary Care Physician: [name] Other Clinician: Referring Physician: Referral, [name] Treating Physician/Extender: Sharalyn Ink in Treatment: 0 Education Assessment Education Provided To: Patient Education Topics Provided Venous: Handouts: Other: wrap precautions and indications Methods: Explain/Verbal Responses: State content correctly Electronic Signature(s) Signed: 02/14/2019 5:24:46 PM By: [name] Entered By: [name] on 02/14/2019 27:51:70 Escandon, Maurya Jenetta [name] ([id number]) -------------------------------------------------------------------------------- Wound Assessment Details Patient Name: Dolson, Sejla O. Date of Service: 02/14/2019 10:15 AM Medical Record Number: [id number] Patient Account Number: [id number] Date of Birth/Sex: December 04, 1935 (84 y.o. F) Treating RN: [name] Primary Care Niki Payment: [name] Other Clinician: Referring Myisha Pickerel: [name] Treating Eileene Kisling/Extender: STONE III, [name] Weeks in Treatment: 0 Wound Status Wound Number: 3 Primary Diabetic Wound/Ulcer of the Lower Extremity Etiology: Wound Location: Right Lower Leg - Lateral Wound Open Wounding Event: Trauma Status: Date Acquired: 02/09/2019 Comorbid Cataracts, Anemia, Chronic Obstructive Weeks Of Treatment: 0 History:  Pulmonary Disease (COPD), Type II Diabetes, Clustered Wound:  No Received Radiation Photos Photo Uploaded By: [name], BSN, RN, CWS, [name] on 02/14/2019 15:50:21 Wound Measurements Length: (cm) 1.1 Width: (cm) 1.5 Depth: (cm) 0.1 Area: (cm) 1.296 Volume: (cm) 0.13 % Reduction in Area: 0% % Reduction in Volume: 0% Epithelialization: None Tunneling: No Undermining: No Wound Description Classification: Grade 2 Foul Odor Wound Margin: Flat and Intact Slough/Fi Exudate Amount: Medium Exudate Type: Serosanguineous Exudate Color: red, brown After Cleansing: No brino Yes Wound Bed Granulation Amount: Medium (34-66%) Exposed Structure Granulation Quality: Red Fascia Exposed: No Necrotic Amount: Medium (34-66%) Fat Layer (Subcutaneous Tissue) Exposed: Yes Necrotic Quality: Adherent Slough Tendon Exposed: No Muscle Exposed: No Joint Exposed: No Bone Exposed: No Periwound Skin Texture Pizana, Voncille O. ([id number]) Texture Color No Abnormalities Noted: No No Abnormalities Noted: No Callus: No Atrophie Blanche: No Crepitus: No Cyanosis: No Excoriation: No Ecchymosis: No Induration: No Erythema: No Rash: No Hemosiderin Staining: No Scarring: No Mottled: No Pallor: No Moisture Rubor: No No Abnormalities Noted: No Dry / Scaly: No Maceration: No Wound Preparation Ulcer Cleansing: Rinsed/Irrigated with Saline Topical Anesthetic Applied: None Electronic Signature(s) Signed: 02/14/2019 5:58:39 PM By: [name], BSN, RN, CWS, Kim RN, BSN Entered By: [name], BSN, RN, CWS, [name] on 02/14/2019 40:81:44 [place], [name] ([id number]) -------------------------------------------------------------------------------- Vitals Details Patient Name: [name] Date of Service: 02/14/2019 10:15 AM Medical Record Number: [id number] Patient Account Number: [id number] Date of Birth/Sex: 10/26/1935 (84 y.o. F) Treating RN: [name] Primary Care Ramla Hase: [name] Other  Clinician: Referring Yanil Dawe: [name] Treating Kennidi Yoshida/Extender: [name], [name] Weeks in Treatment: 0 Vital Signs Time Taken: 10:49 Temperature (F): 98.2 Height (in): 64 Pulse (bpm): 89 Weight (lbs): 136.1 Respiratory Rate (breaths/min): 16 Body Mass Index (BMI): 23.4 Blood Pressure (mmHg): 145/61 Reference Range: 80 - 120 mg / dl Electronic Signature(s) Signed: 02/14/2019 5:58:39 PM By: [name], BSN, RN, CWS, Kim RN, BSN Entered By: [name], BSN, RN, CWS, [name] on 02/14/2019 10:49:45

## 2019-02-17 NOTE — Progress Notes (Signed)
KOREY, ARROYO ([id number]) Visit Report for 02/14/2019 Chief Complaint Document Details Patient Name: [name], [name]. Date of Service: 02/14/2019 10:15 AM Medical Record Number: [id number] Patient Account Number: [id number] Date of Birth/Sex: 1935/02/13 (84 y.o. F) Treating RN: Montey Hora Primary Care Provider: Harrel Lemon Other Clinician: Referring Provider: Referral, Self Treating Provider/Extender: Melburn Hake, Deyona Soza Weeks in Treatment: 0 Information Obtained from: Patient Chief Complaint Right shin ulcer Electronic Signature(s) Signed: 02/15/2019 11:58:16 PM By: Worthy Keeler PA-C Entered By: Worthy Keeler on 02/14/2019 67:61:95 Helbert, Kellianne Jenetta Downer ([id number]) -------------------------------------------------------------------------------- HPI Details Patient Name: [name] Date of Service: 02/14/2019 10:15 AM Medical Record Number: [id number] Patient Account Number: [id number] Date of Birth/Sex: [birth year] (84 y.o. F) Treating RN: Montey Hora Primary Care Provider: Harrel Lemon Other Clinician: Referring Provider: Referral, Self Treating Provider/Extender: Melburn Hake, Shonique Pelphrey Weeks in Treatment: 0 History of Present Illness HPI Description: 84 year old patient who is known to have diabetes mellitus and tobacco abuse has had a injury to the right posterior calf about a month ago. He has been treated with local care and 2 courses of antibiotics which include Keflex and doxycycline which she has completed. Smokes about a pack and a half of cigarettes a day and has a past medical history of COPD, diabetes mellitus, hyperlipidemia,psoriasis and hypothyroidism. Readmission: 04/04/18 patient presents today for a initial evaluation today concerning a right forearm skin tear which has been present for about two weeks. Currently need this point has been applied to the wound bed followed by a protective dressing that has been secured by wrapping instead of adhesive's. Currently  the patient's wound does seem to be doing better patient and her husband states that the excess skin from the skin tear was actually trimmed away prior to starting the wound care they have been performing. With that being said she seems to be doing well there's no evidence of infection which is good news and overall the wound seems to be showing signs of good granulation and overall improvement which is great news. Fortunately she has no with valid bladder dysfunction unexpected fevers chills or weight loss. This seems to be rather straightforward in my opinion in regard to treatment. 04/12/18 on evaluation today patient appears to be doing very well in regard to her right forearm skin tear. She's been tolerating the dressing changes without complication. In general this appears to show signs of great improvement since last time I saw her. Overall I'm very pleased with how things are going patient still somewhat scared about the fact that the skin that is healing over appears very fragile I explained that it does take time for this to really tough enough even once it closes over. Fortunately there is no evidence of infection. 04/19/18 on evaluation today patient appears to be doing very well in regard to her right forearm ulcer. This actually appears to be completely healed at this point which is good news. There does not appear to be any evidence of infection currently. She still has a little bit of discomfort due to some underlying irritation but overall she is doing excellent. Readmission: 02/14/19 on evaluation today patient actually appears to be doing very well all things considering in regard to her right shin ulcer which occurred as a result of a picture frame dropping onto the leg last Thursday. This is not been quite a week as of today. Because we couldn't get him today this happened she actually did go to urgent care was she with this guy Keflex  for week and also a pressure dressing was  applied. this has caused some issues with fluid buildup below and above the dressing unfortunately. Fortunately there does not appear to be signs of infection at this time which is excellent news No fevers, chills, nausea, or vomiting noted at this time. Electronic Signature(s) Signed: 02/15/2019 11:58:16 PM By: Worthy Keeler PA-C Entered By: Worthy Keeler on 02/15/2019 28:41:32 Orlov, [name] ([id number]) -------------------------------------------------------------------------------- Physical Exam Details Patient Name: [name], [name] Date of Service: 02/14/2019 10:15 AM Medical Record Number: [id number] Patient Account Number: [id number] Date of Birth/Sex: 12-10-1935 (84 y.o. F) Treating RN: Montey Hora Primary Care Provider: Harrel Lemon Other Clinician: Referring Provider: Referral, Self Treating Provider/Extender: STONE III, Demisha Nokes Weeks in Treatment: 0 Constitutional patient is hypertensive.. pulse regular and within target range for patient.Marland Kitchen respirations regular, non-labored and within target range for patient.Marland Kitchen temperature within target range for patient.. Well-nourished and well-hydrated in no acute distress. Eyes conjunctiva clear no eyelid edema noted. pupils equal round and reactive to light and accommodation. Ears, Nose, Mouth, and Throat no gross abnormality of ear auricles or external auditory canals. normal hearing noted during conversation. mucus membranes moist. Respiratory normal breathing without difficulty. clear to auscultation bilaterally. Cardiovascular regular rate and rhythm with normal S1, S2. 2+ dorsalis pedis/posterior tibialis pulses. 1+ pitting edema of the bilateral lower extremities. Gastrointestinal (GI) soft, non-tender, non-distended, +BS. no ventral hernia noted. Musculoskeletal normal gait and posture. no significant deformity or arthritic changes, no loss or range of motion, no clubbing. Psychiatric this patient is able to make decisions  and demonstrates good insight into disease process. Alert and Oriented x 3. pleasant and cooperative. Notes Patient's wound currently shows evidence of actually fairly good granulation bed which is excellent news. Fortunately there's no signs of infection at this time. No sharp debridement was necessary today the wound bed appears to be very clean. Electronic Signature(s) Signed: 02/15/2019 11:58:16 PM By: Worthy Keeler PA-C Entered By: Worthy Keeler on 02/15/2019 42:59:56 Clary, [name] ([id number]) -------------------------------------------------------------------------------- Physician Orders Details Patient Name: NAEEMAH, JASMER. Date of Service: 02/14/2019 10:15 AM Medical Record Number: [id number] Patient Account Number: [id number] Date of Birth/Sex: 03-26-1935 (85 y.o. F) Treating RN: Montey Hora Primary Care Provider: Harrel Lemon Other Clinician: Referring Provider: Referral, Self Treating Provider/Extender: Melburn Hake, Robbi Scurlock Weeks in Treatment: 0 Verbal / Phone Orders: No Diagnosis Coding ICD-10 Coding Code Description I89.0 Lymphedema, not elsewhere classified S81.801A Unspecified open wound, right lower leg, initial encounter E11.622 Type 2 diabetes mellitus with other skin ulcer Wound Cleansing Wound #3 Right,Lateral Lower Leg o Clean wound with Normal Saline. o May shower with protection. - PLEASE DO NOT GET YOUR WRAP WET Primary Wound Dressing Wound #3 Right,Lateral Lower Leg o Silver Collagen Secondary Dressing Wound #3 Right,Lateral Lower Leg o ABD pad Dressing Change Frequency Wound #3 Right,Lateral Lower Leg o Change dressing every week Follow-up Appointments Wound #3 Right,Lateral Lower Leg o Return Appointment in 1 week. o Nurse Visit as needed - PLEASE NOTIFY us IF YOUR WRAP SLIDES DOWN OR GETS WET AND WE WILL WORK YOU IN TO REWRAP YOUR LEG Edema Control Wound #3 Right,Lateral Lower Leg o 3 Layer Compression System - Right Lower  Extremity o Elevate legs to the level of the heart and pump ankles as often as possible Electronic Signature(s) Signed: 02/14/2019 5:24:46 PM By: Montey Hora Signed: 02/15/2019 11:58:16 PM By: Worthy Keeler PA-C Entered By: Montey Hora on 02/14/2019 06:30:16 [place], Katlyne O. ([id number]) Artola, Malynda O. (  [id number]) -------------------------------------------------------------------------------- Problem List Details Patient Name: MAEGAN, BULLER. Date of Service: 02/14/2019 10:15 AM Medical Record Number: [id number] Patient Account Number: [id number] Date of Birth/Sex: Jan 21, 1935 (84 y.o. F) Treating RN: Montey Hora Primary Care Provider: Harrel Lemon Other Clinician: Referring Provider: Referral, Self Treating Provider/Extender: Melburn Hake, Brecken Dewoody Weeks in Treatment: 0 Active Problems ICD-10 Evaluated Encounter Code Description Active Date Today Diagnosis I89.0 Lymphedema, not elsewhere classified 02/14/2019 No Yes S81.801A Unspecified open wound, right lower leg, initial encounter 02/14/2019 No Yes E11.622 Type 2 diabetes mellitus with other skin ulcer 02/14/2019 No Yes Inactive Problems Resolved Problems Electronic Signature(s) Signed: 02/15/2019 11:58:16 PM By: Worthy Keeler PA-C Entered By: Worthy Keeler on 02/14/2019 70:01:74 Celaya, Shannia Jenetta Downer ([id number]) -------------------------------------------------------------------------------- Progress Note Details Patient Name: Agcaoili, Kalin O. Date of Service: 02/14/2019 10:15 AM Medical Record Number: [id number] Patient Account Number: [id number] Date of Birth/Sex: 10/31/1935 (84 y.o. F) Treating RN: Montey Hora Primary Care Provider: Harrel Lemon Other Clinician: Referring Provider: Referral, Self Treating Provider/Extender: Melburn Hake, Nikesh Teschner Weeks in Treatment: 0 Subjective Chief Complaint Information obtained from Patient Right shin ulcer History of Present Illness (HPI) 84 year old patient who is known to have  diabetes mellitus and tobacco abuse has had a injury to the right posterior calf about a month ago. He has been treated with local care and 2 courses of antibiotics which include Keflex and doxycycline which she has completed. Smokes about a pack and a half of cigarettes a day and has a past medical history of COPD, diabetes mellitus, hyperlipidemia,psoriasis and hypothyroidism. Readmission: 04/04/18 patient presents today for a initial evaluation today concerning a right forearm skin tear which has been present for about two weeks. Currently need this point has been applied to the wound bed followed by a protective dressing that has been secured by wrapping instead of adhesive's. Currently the patient's wound does seem to be doing better patient and her husband states that the excess skin from the skin tear was actually trimmed away prior to starting the wound care they have been performing. With that being said she seems to be doing well there's no evidence of infection which is good news and overall the wound seems to be showing signs of good granulation and overall improvement which is great news. Fortunately she has no with valid bladder dysfunction unexpected fevers chills or weight loss. This seems to be rather straightforward in my opinion in regard to treatment. 04/12/18 on evaluation today patient appears to be doing very well in regard to her right forearm skin tear. She's been tolerating the dressing changes without complication. In general this appears to show signs of great improvement since last time I saw her. Overall I'm very pleased with how things are going patient still somewhat scared about the fact that the skin that is healing over appears very fragile I explained that it does take time for this to really tough enough even once it closes over. Fortunately there is no evidence of infection. 04/19/18 on evaluation today patient appears to be doing very well in regard to her right  forearm ulcer. This actually appears to be completely healed at this point which is good news. There does not appear to be any evidence of infection currently. She still has a little bit of discomfort due to some underlying irritation but overall she is doing excellent. Readmission: 02/14/19 on evaluation today patient actually appears to be doing very well all things considering in regard to her right shin ulcer which occurred as a  result of a picture frame dropping onto the leg last Thursday. This is not been quite a week as of today. Because we couldn't get him today this happened she actually did go to urgent care was she with this guy Keflex for week and also a pressure dressing was applied. this has caused some issues with fluid buildup below and above the dressing unfortunately. Fortunately there does not appear to be signs of infection at this time which is excellent news No fevers, chills, nausea, or vomiting noted at this time. Wound History Patient presents with 1 open wound that has been present for approximately 1. Patient has been treating wound in the following manner: presssure dressing. Laboratory tests have not been performed in the last month. Patient reportedly has not tested positive for an antibiotic resistant organism. Patient reportedly has not tested positive for osteomyelitis. Patient YNEZ, EUGENIO ([id number]) reportedly has not had testing performed to evaluate circulation in the legs. Patient History Information obtained from Patient. Allergies Sulfa (Sulfonamide Antibiotics) Family History Heart Disease - Mother, No family history of Cancer, Diabetes, Hereditary Spherocytosis, Hypertension, Kidney Disease, Lung Disease, Seizures, Stroke, Thyroid Problems, Tuberculosis. Social History Current every day smoker - 1 1.5 a day, Marital Status - Married, Alcohol Use - Never, Drug Use - No History, Caffeine Use - Daily. Medical History Eyes Patient has history of  Cataracts - removed bilaterally Denies history of Glaucoma, Optic Neuritis Ear/Nose/Mouth/Throat Denies history of Chronic sinus problems/congestion, Middle ear problems Hematologic/Lymphatic Patient has history of Anemia Denies history of Hemophilia, Human Immunodeficiency Virus, Lymphedema, Sickle Cell Disease Respiratory Patient has history of Chronic Obstructive Pulmonary Disease (COPD) Denies history of Aspiration, Asthma, Pneumothorax, Sleep Apnea, Tuberculosis Cardiovascular Denies history of Angina, Arrhythmia, Congestive Heart Failure, Coronary Artery Disease, Deep Vein Thrombosis, Hypertension, Hypotension, Myocardial Infarction, Peripheral Arterial Disease, Peripheral Venous Disease, Phlebitis, Vasculitis Gastrointestinal Denies history of Cirrhosis , Colitis, Crohn s, Hepatitis A, Hepatitis B, Hepatitis C Endocrine Patient has history of Type II Diabetes Denies history of Type I Diabetes Immunological Denies history of Lupus Erythematosus, Raynaud s, Scleroderma Integumentary (Skin) Denies history of History of Burn, History of pressure wounds Musculoskeletal Denies history of Gout, Rheumatoid Arthritis, Osteoarthritis, Osteomyelitis Neurologic Denies history of Dementia, Neuropathy, Quadriplegia, Paraplegia, Seizure Disorder Oncologic Patient has history of Received Radiation - 2015 Denies history of Received Chemotherapy Patient is treated with Oral Agents. Blood sugar is not tested. Medical And Surgical History Notes Cardiovascular hyperlipidemia Review of Systems (ROS) Eyes The patient has no complaints or symptoms. GRACELIN, WEISBERG ([id number]) Ear/Nose/Mouth/Throat The patient has no complaints or symptoms. Hematologic/Lymphatic The patient has no complaints or symptoms. Respiratory The patient has no complaints or symptoms. Cardiovascular Complains or has symptoms of LE edema - right rt wound. Denies complaints or symptoms of Chest  pain. Gastrointestinal The patient has no complaints or symptoms. Endocrine The patient has no complaints or symptoms. Genitourinary The patient has no complaints or symptoms. Immunological The patient has no complaints or symptoms. Integumentary (Skin) Complains or has symptoms of Wounds, Bleeding or bruising tendency. Denies complaints or symptoms of Breakdown, Swelling. Musculoskeletal The patient has no complaints or symptoms. Neurologic The patient has no complaints or symptoms. Oncologic The patient has no complaints or symptoms. Objective Constitutional patient is hypertensive.. pulse regular and within target range for patient.Marland Kitchen respirations regular, non-labored and within target range for patient.Marland Kitchen temperature within target range for patient.. Well-nourished and well-hydrated in no acute distress. Vitals Time Taken: 10:49 AM, Height: 64 in, Weight: 136.1 lbs, BMI:  23.4, Temperature: 98.2 F, Pulse: 89 bpm, Respiratory Rate: 16 breaths/min, Blood Pressure: 145/61 mmHg. Eyes conjunctiva clear no eyelid edema noted. pupils equal round and reactive to light and accommodation. Ears, Nose, Mouth, and Throat no gross abnormality of ear auricles or external auditory canals. normal hearing noted during conversation. mucus membranes moist. Respiratory normal breathing without difficulty. clear to auscultation bilaterally. Cardiovascular regular rate and rhythm with normal S1, S2. 2+ dorsalis pedis/posterior tibialis pulses. 1+ pitting edema of the bilateral lower extremities. Dauenhauer, Prapti Jenetta Downer ([id number]) Gastrointestinal (GI) soft, non-tender, non-distended, +BS. no ventral hernia noted. Musculoskeletal normal gait and posture. no significant deformity or arthritic changes, no loss or range of motion, no clubbing. Psychiatric this patient is able to make decisions and demonstrates good insight into disease process. Alert and Oriented x 3. pleasant and cooperative. General  Notes: Patient's wound currently shows evidence of actually fairly good granulation bed which is excellent news. Fortunately there's no signs of infection at this time. No sharp debridement was necessary today the wound bed appears to be very clean. Integumentary (Hair, Skin) Wound #3 status is Open. Original cause of wound was Trauma. The wound is located on the Right,Lateral Lower Leg. The wound measures 1.1cm length x 1.5cm width x 0.1cm depth; 1.296cm^2 area and 0.13cm^3 volume. There is Fat Layer (Subcutaneous Tissue) Exposed exposed. There is no tunneling or undermining noted. There is a medium amount of serosanguineous drainage noted. The wound margin is flat and intact. There is medium (34-66%) red granulation within the wound bed. There is a medium (34-66%) amount of necrotic tissue within the wound bed including Adherent Slough. The periwound skin appearance did not exhibit: Callus, Crepitus, Excoriation, Induration, Rash, Scarring, Dry/Scaly, Maceration, Atrophie Blanche, Cyanosis, Ecchymosis, Hemosiderin Staining, Mottled, Pallor, Rubor, Erythema. Assessment Active Problems ICD-10 Lymphedema, not elsewhere classified Unspecified open wound, right lower leg, initial encounter Type 2 diabetes mellitus with other skin ulcer Procedures Wound #3 Pre-procedure diagnosis of Wound #3 is a Diabetic Wound/Ulcer of the Lower Extremity located on the Right,Lateral Lower Leg . There was a Three Layer Compression Therapy Procedure with a pre-treatment ABI of 1.2 by Montey Hora, RN. Post procedure Diagnosis Wound #3: Same as Pre-Procedure Plan Wound Cleansing: Wound #3 Right,Lateral Lower Leg: Clean wound with Normal Saline. BREELLA, VANOSTRAND ([id number]) May shower with protection. - PLEASE DO NOT GET YOUR WRAP WET Primary Wound Dressing: Wound #3 Right,Lateral Lower Leg: Silver Collagen Secondary Dressing: Wound #3 Right,Lateral Lower Leg: ABD pad Dressing Change Frequency: Wound  #3 Right,Lateral Lower Leg: Change dressing every week Follow-up Appointments: Wound #3 Right,Lateral Lower Leg: Return Appointment in 1 week. Nurse Visit as needed - PLEASE NOTIFY us IF YOUR WRAP SLIDES DOWN OR GETS WET AND WE WILL WORK YOU IN TO REWRAP YOUR LEG Edema Control: Wound #3 Right,Lateral Lower Leg: 3 Layer Compression System - Right Lower Extremity Elevate legs to the level of the heart and pump ankles as often as possible My suggestion currently is going to be that we go ahead and initiate treatment with the above wound care measures utilizing collagen at this point. Patient is in agreement with plan. We will also utilize a compression wrap to try to help with fluid management she does have swelling of this long trip. Anything changes or worsens she let me know otherwise will see were things stand at follow-up. Please see above for specific wound care orders. We will see patient for re-evaluation in 1 week(s) here in the clinic. If anything worsens or changes  patient will contact our office for additional recommendations. Electronic Signature(s) Signed: 02/15/2019 11:58:16 PM By: Worthy Keeler PA-C Entered By: Worthy Keeler on 02/15/2019 93:79:02 Hoeschen, Dayelin Jenetta Downer ([id number]) -------------------------------------------------------------------------------- ROS/PFSH Details Patient Name: [name] Date of Service: 02/14/2019 10:15 AM Medical Record Number: [id number] Patient Account Number: [id number] Date of Birth/Sex: [birth year] (84 y.o. F) Treating RN: Cornell Barman Primary Care Provider: Harrel Lemon Other Clinician: Referring Provider: Referral, Self Treating Provider/Extender: Melburn Hake, Alroy Portela Weeks in Treatment: 0 Information Obtained From Patient Wound History Do you currently have one or more open woundso Yes How many open wounds do you currently haveo 1 Approximately how long have you had your woundso 1 How have you been treating your wound(s) until nowo  presssure dressing Has your wound(s) ever healed and then re-openedo No Have you had any lab work done in the past montho No Have you tested positive for an antibiotic resistant organism (MRSA, VRE)o No Have you tested positive for osteomyelitis (bone infection)o No Have you had any tests for circulation on your legso No Cardiovascular Complaints and Symptoms: Positive for: LE edema - right rt wound Negative for: Chest pain Medical History: Negative for: Angina; Arrhythmia; Congestive Heart Failure; Coronary Artery Disease; Deep Vein Thrombosis; Hypertension; Hypotension; Myocardial Infarction; Peripheral Arterial Disease; Peripheral Venous Disease; Phlebitis; Vasculitis Past Medical History Notes: hyperlipidemia Integumentary (Skin) Complaints and Symptoms: Positive for: Wounds; Bleeding or bruising tendency Negative for: Breakdown; Swelling Medical History: Negative for: History of Burn; History of pressure wounds Eyes Complaints and Symptoms: No Complaints or Symptoms Medical History: Positive for: Cataracts - removed bilaterally Negative for: Glaucoma; Optic Neuritis Ear/Nose/Mouth/Throat Complaints and Symptoms: No Complaints or Symptoms Thaden, Kadi O. ([id number]) Medical History: Negative for: Chronic sinus problems/congestion; Middle ear problems Hematologic/Lymphatic Complaints and Symptoms: No Complaints or Symptoms Medical History: Positive for: Anemia Negative for: Hemophilia; Human Immunodeficiency Virus; Lymphedema; Sickle Cell Disease Respiratory Complaints and Symptoms: No Complaints or Symptoms Medical History: Positive for: Chronic Obstructive Pulmonary Disease (COPD) Negative for: Aspiration; Asthma; Pneumothorax; Sleep Apnea; Tuberculosis Gastrointestinal Complaints and Symptoms: No Complaints or Symptoms Medical History: Negative for: Cirrhosis ; Colitis; Crohnos; Hepatitis A; Hepatitis B; Hepatitis C Endocrine Complaints and Symptoms: No  Complaints or Symptoms Medical History: Positive for: Type II Diabetes Negative for: Type I Diabetes Time with diabetes: 15 years Treated with: Oral agents Blood sugar tested every day: No Genitourinary Complaints and Symptoms: No Complaints or Symptoms Immunological Complaints and Symptoms: No Complaints or Symptoms Medical History: Negative for: Lupus Erythematosus; Raynaudos; Scleroderma Musculoskeletal Complaints and Symptoms: No Complaints or Symptoms Pung, Natlie O. ([id number]) Medical History: Negative for: Gout; Rheumatoid Arthritis; Osteoarthritis; Osteomyelitis Neurologic Complaints and Symptoms: No Complaints or Symptoms Medical History: Negative for: Dementia; Neuropathy; Quadriplegia; Paraplegia; Seizure Disorder Oncologic Complaints and Symptoms: No Complaints or Symptoms Medical History: Positive for: Received Radiation - 2015 Negative for: Received Chemotherapy HBO Extended History Items Eyes: Cataracts Immunizations Pneumococcal Vaccine: Received Pneumococcal Vaccination: Yes Implantable Devices None Family and Social History Cancer: No; Diabetes: No; Heart Disease: Yes - Mother; Hereditary Spherocytosis: No; Hypertension: No; Kidney Disease: No; Lung Disease: No; Seizures: No; Stroke: No; Thyroid Problems: No; Tuberculosis: No; Current every day smoker - 1 1.5 a day; Marital Status - Married; Alcohol Use: Never; Drug Use: No History; Caffeine Use: Daily; Financial Concerns: No; Food, Clothing or Shelter Needs: No; Support System Lacking: No; Transportation Concerns: No; Advanced Directives: No; Patient does not want information on Advanced Directives; Do not resuscitate: No; Living Will: Yes (Not Provided); 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 Date of Service: 02/14/2019 Medical Record Number: [id number] Patient Account Number: [id number] Date of Birth/Sex: [birth year] (84 y.o. F) Treating RN: Montey Hora Primary Care Provider: Harrel Lemon Other Clinician: Referring Provider: Referral, Self Treating Provider/Extender: Melburn Hake, Thersa Mohiuddin Weeks in Treatment: 0 Diagnosis Coding ICD-10 Codes Code Description I89.0 Lymphedema, not elsewhere classified S81.801A Unspecified open wound, right lower leg, initial encounter E11.622 Type 2 diabetes mellitus with other skin ulcer Facility Procedures CPT4 Code Description: 19166060 99213 - WOUND CARE VISIT-LEV 3 EST PT Modifier: Quantity: 1 CPT4 Code Description: [id number] (Facility Use Only) [phone] - APPLY MULTLAY COMPRS LWR RT LEG Modifier: Quantity: 1 Physician Procedures CPT4 Code: 2023343 Description: 56861 - WC PHYS LEVEL 4 - EST PT ICD-10 Diagnosis Description I89.0 Lymphedema, not elsewhere classified S81.801A Unspecified open wound, right lower leg, initial encounte E11.622 Type 2 diabetes mellitus with other skin ulcer Modifier: r Quantity: 1 Electronic Signature(s) Signed: 02/15/2019 11:58:16 PM By: Worthy Keeler PA-C Previous Signature: 02/14/2019 5:24:46 PM Version By: Montey Hora Entered By: Worthy Keeler on 02/15/2019 23:37:15

## 2019-02-21 ENCOUNTER — Encounter: Payer: Medicare Other | Admitting: Physician Assistant

## 2019-02-21 DIAGNOSIS — E11622 Type 2 diabetes mellitus with other skin ulcer: Secondary | ICD-10-CM | POA: Diagnosis not present

## 2019-02-23 NOTE — Progress Notes (Signed)
[name], [name] ([id number]) Visit Report for 02/21/2019 Arrival Information Details Patient Name: [name], [name]. Date of Service: 02/21/2019 12:45 PM Medical Record Number: [id number] Patient Account Number: [id number] Date of Birth/Sex: [birth year] (84 y.o. F) Treating RN: Army Melia Primary Care Anneli Bing: Harrel Lemon Other Clinician: Referring Tyasia Packard: Harrel Lemon Treating Sutton Hirsch/Extender: Melburn Hake, HOYT Weeks in Treatment: 1 Visit Information History Since Last Visit Added or deleted any medications: No Patient Arrived: Ambulatory Any new allergies or adverse reactions: No Arrival Time: 13:01 Had a fall or experienced change in No Accompanied By: husband activities of daily living that may affect Transfer Assistance: None risk of falls: Patient Requires Transmission-Based No Signs or symptoms of abuse/neglect since last visito No Precautions: Hospitalized since last visit: No Patient Has Alerts: Yes Implantable device outside of the clinic excluding No Patient Alerts: Patient on Blood cellular tissue based products placed in the center Thinner since last visit: Aspirin 81 mg Has Dressing in Place as Prescribed: Yes DM II ABI: (L)1.13 (R) Pain Present Now: No 1.19 Electronic Signature(s) Signed: 02/21/2019 4:39:27 PM By: Army Melia Entered By: Army Melia on 02/21/2019 29:51:88 [name], [name] ([id number]) -------------------------------------------------------------------------------- Encounter Discharge Information Details Patient Name: [name]. Date of Service: 02/21/2019 12:45 PM Medical Record Number: [id number] Patient Account Number: [id number] Date of Birth/Sex: Apr 29, 1935 (84 y.o. F) Treating RN: Cornell Barman Primary Care Frimet Durfee: Harrel Lemon Other Clinician: Referring Rashad Auld: Harrel Lemon Treating Holten Spano/Extender: Melburn Hake, HOYT Weeks in Treatment: 1 Encounter Discharge Information Items Post Procedure Vitals Discharge  Condition: Stable Temperature (F): 97.9 Ambulatory Status: Ambulatory Pulse (bpm): 95 Discharge Destination: Home Respiratory Rate (breaths/min): 16 Transportation: Private Auto Blood Pressure (mmHg): 126/91 Accompanied By: husband Schedule Follow-up Appointment: Yes Clinical Summary of Care: Electronic Signature(s) Signed: 02/21/2019 6:02:35 PM By: Gretta Cool, BSN, RN, CWS, Kim RN, BSN Entered By: Gretta Cool, BSN, RN, CWS, Kim on 02/21/2019 57:32:20 [name], [name] ([id number]) -------------------------------------------------------------------------------- Lower Extremity Assessment Details Patient Name: [name], [name] Date of Service: 02/21/2019 12:45 PM Medical Record Number: [id number] Patient Account Number: [id number] Date of Birth/Sex: Jul 10, 1935 (84 y.o. F) Treating RN: Army Melia Primary Care Joliana Claflin: Harrel Lemon Other Clinician: Referring Addalyn Speedy: Harrel Lemon Treating Kohler Pellerito/Extender: Melburn Hake, HOYT Weeks in Treatment: 1 Edema Assessment Assessed: [Left: No] [Right: No] Edema: [Left: N] [Right: o] Electronic Signature(s) Signed: 02/21/2019 4:39:27 PM By: Army Melia Entered By: Army Melia on 02/21/2019 61:60:73 [name], [name] ([id number]) -------------------------------------------------------------------------------- Multi Wound Chart Details Patient Name: [name], [name]. Date of Service: 02/21/2019 12:45 PM Medical Record Number: [id number] Patient Account Number: [id number] Date of Birth/Sex: 12-Nov-1935 (84 y.o. F) Treating RN: Cornell Barman Primary Care Mackenzey Crownover: Harrel Lemon Other Clinician: Referring Ervie Mccard: Harrel Lemon Treating Satoru Milich/Extender: Melburn Hake, HOYT Weeks in Treatment: 1 Vital Signs Height(in): 64 Pulse(bpm): 95 Weight(lbs): 136.1 Blood Pressure(mmHg): 126/91 Body Mass Index(BMI): 23 Temperature(F): 97.9 Respiratory Rate 16 (breaths/min): Photos: [3:No Photos] [N/A:N/A] Wound Location: [3:Right Lower Leg - Lateral]  [N/A:N/A] Wounding Event: [3:Trauma] [N/A:N/A] Primary Etiology: [3:Diabetic Wound/Ulcer of the Lower Extremity] [N/A:N/A] Secondary Etiology: [3:Skin Tear] [N/A:N/A] Comorbid History: [3:Cataracts, Anemia, Chronic Obstructive Pulmonary Disease (COPD), Type II Diabetes, Received Radiation] [N/A:N/A] Date Acquired: [3:02/09/2019] [N/A:N/A] Weeks of Treatment: [3:1] [N/A:N/A] Wound Status: [3:Open] [N/A:N/A] Measurements L x W x D [3:0.7x0.5x0.1] [N/A:N/A] (cm) Area (cm) : [3:0.275] [N/A:N/A] Volume (cm) : [3:0.027] [N/A:N/A] % Reduction in Area: [3:78.80%] [N/A:N/A] % Reduction in Volume: [3:79.20%] [N/A:N/A] Classification: [3:Grade 2] [N/A:N/A] Exudate Amount: [3:Medium] [N/A:N/A] Exudate Type: [3:Serosanguineous] [N/A:N/A] Exudate Color: [3:red, brown] [N/A:N/A] Wound Margin: [3:Flat and  Intact] [N/A:N/A] Granulation Amount: [3:Medium (34-66%)] [N/A:N/A] Granulation Quality: [3:Red] [N/A:N/A] Necrotic Amount: [3:Medium (34-66%)] [N/A:N/A] Necrotic Tissue: [3:Eschar, Adherent Slough] [N/A:N/A] Exposed Structures: [3:Fat Layer (Subcutaneous Tissue) Exposed: Yes Fascia: No Tendon: No Muscle: No Joint: No Bone: No] [N/A:N/A] Epithelialization: [3:None] [N/A:N/A] Periwound Skin Texture: [N/A:N/A] Excoriation: No Induration: No Callus: No Crepitus: No Rash: No Scarring: No Periwound Skin Moisture: Maceration: No N/A N/A Dry/Scaly: No Periwound Skin Color: Atrophie Blanche: No N/A N/A Cyanosis: No Ecchymosis: No Erythema: No Hemosiderin Staining: No Mottled: No Pallor: No Rubor: No Temperature: No Abnormality N/A N/A Tenderness on Palpation: Yes N/A N/A Wound Preparation: Ulcer Cleansing: N/A N/A Rinsed/Irrigated with Saline Topical Anesthetic Applied: None, Other: lidocaine 4% Treatment Notes Electronic Signature(s) Signed: 02/21/2019 6:07:40 PM By: Gretta Cool, BSN, RN, CWS, Kim RN, BSN Entered By: Gretta Cool, BSN, RN, CWS, Kim on 02/21/2019 86:76:19 [name], [name]  ([id number]) -------------------------------------------------------------------------------- Multi-Disciplinary Care Plan Details Patient Name: [name], [name]. Date of Service: 02/21/2019 12:45 PM Medical Record Number: [id number] Patient Account Number: [id number] Date of Birth/Sex: Aug 16, 1935 (84 y.o. F) Treating RN: Cornell Barman Primary Care Sareen Randon: Harrel Lemon Other Clinician: Referring Yula Crotwell: Harrel Lemon Treating Likisha Alles/Extender: Melburn Hake, HOYT Weeks in Treatment: 1 Active Inactive Abuse / Safety / Falls / Self Care Management Nursing Diagnoses: Potential for falls Goals: Patient will not experience any injury related to falls Date Initiated: 02/14/2019 Target Resolution Date: 05/20/2019 Goal Status: Active Interventions: Assess fall risk on admission and as needed Notes: Orientation to the Wound Care Program Nursing Diagnoses: Knowledge deficit related to the wound healing center program Goals: Patient/caregiver will verbalize understanding of the [facility] Date Initiated: 02/14/2019 Target Resolution Date: 05/20/2019 Goal Status: Active Interventions: Provide education on orientation to the wound center Notes: Wound/Skin Impairment Nursing Diagnoses: Impaired tissue integrity Goals: Ulcer/skin breakdown will heal within 14 weeks Date Initiated: 02/14/2019 Target Resolution Date: 05/20/2019 Goal Status: Active Interventions: Assess patient/caregiver ability to obtain necessary supplies JULEY, GIOVANETTI ([id number]) Assess patient/caregiver ability to perform ulcer/skin care regimen upon admission and as needed Assess ulceration(s) every visit Notes: Electronic Signature(s) Signed: 02/21/2019 6:07:40 PM By: Gretta Cool, BSN, RN, CWS, Kim RN, BSN Entered By: Gretta Cool, BSN, RN, CWS, Kim on 02/21/2019 73:41:93 [name], [name] ([id number]) -------------------------------------------------------------------------------- Pain Assessment Details Patient  Name: [name]. Date of Service: 02/21/2019 12:45 PM Medical Record Number: [id number] Patient Account Number: [id number] Date of Birth/Sex: 05-Aug-1935 (84 y.o. F) Treating RN: Army Melia Primary Care Madelina Sanda: Harrel Lemon Other Clinician: Referring Nain Rudd: Harrel Lemon Treating Areyanna Figeroa/Extender: Melburn Hake, HOYT Weeks in Treatment: 1 Active Problems Location of Pain Severity and Description of Pain Patient Has Paino No Site Locations Pain Management and Medication Current Pain Management: Electronic Signature(s) Signed: 02/21/2019 4:39:27 PM By: Army Melia Entered By: Army Melia on 02/21/2019 83:41:96 [name], [name] ([id number]) -------------------------------------------------------------------------------- Patient/Caregiver Education Details Patient Name: [name] Date of Service: 02/21/2019 12:45 PM Medical Record Number: [id number] Patient Account Number: [id number] Date of Birth/Gender: 1935/01/11 (84 y.o. F) Treating RN: Cornell Barman Primary Care Physician: Harrel Lemon Other Clinician: Referring Physician: Harrel Lemon Treating Physician/Extender: Sharalyn Ink in Treatment: 1 Education Assessment Education Provided To: Patient Education Topics Provided Wound/Skin Impairment: Handouts: Caring for Your Ulcer, Other: leave dressing in place, do not get wet Methods: Demonstration, Explain/Verbal Responses: State content correctly Electronic Signature(s) Signed: 02/21/2019 6:07:40 PM By: Gretta Cool, BSN, RN, CWS, Kim RN, BSN Entered By: Gretta Cool, BSN, RN, CWS, Kim on 02/21/2019 14:48:18 [place], [name] ([id number]) -------------------------------------------------------------------------------- Wound Assessment Details Patient Name:  [name], [name] Date of Service: 02/21/2019 12:45 PM Medical Record Number: [id number] Patient Account Number: [id number] Date of Birth/Sex: 1935/10/13 (84 y.o. F) Treating RN: Army Melia Primary Care Kynslie Ringle:  Harrel Lemon Other Clinician: Referring Yannis Gumbs: Harrel Lemon Treating Quinnlyn Hearns/Extender: Melburn Hake, HOYT Weeks in Treatment: 1 Wound Status Wound Number: 3 Primary Diabetic Wound/Ulcer of the Lower Extremity Etiology: Wound Location: Right Lower Leg - Lateral Secondary Skin Tear Wounding Event: Trauma Etiology: Date Acquired: 02/09/2019 Wound Open Weeks Of Treatment: 1 Status: Clustered Wound: No Comorbid Cataracts, Anemia, Chronic Obstructive History: Pulmonary Disease (COPD), Type II Diabetes, Received Radiation Photos Photo Uploaded By: Army Melia on 02/21/2019 16:38:56 Wound Measurements Length: (cm) 0.7 Width: (cm) 0.5 Depth: (cm) 0.1 Area: (cm) 0.275 Volume: (cm) 0.027 % Reduction in Area: 78.8% % Reduction in Volume: 79.2% Epithelialization: None Tunneling: No Undermining: No Wound Description Classification: Grade 2 Wound Margin: Flat and Intact Exudate Amount: Medium Exudate Type: Serosanguineous Exudate Color: red, brown Foul Odor After Cleansing: No Slough/Fibrino Yes Wound Bed Granulation Amount: Medium (34-66%) Exposed Structure Granulation Quality: Red Fascia Exposed: No Necrotic Amount: Medium (34-66%) Fat Layer (Subcutaneous Tissue) Exposed: Yes Necrotic Quality: Eschar, Adherent Slough Tendon Exposed: No Muscle Exposed: No Joint Exposed: No Bone Exposed: No [name], [name] ([id number]) Periwound Skin Texture Texture Color No Abnormalities Noted: No No Abnormalities Noted: No Callus: No Atrophie Blanche: No Crepitus: No Cyanosis: No Excoriation: No Ecchymosis: No Induration: No Erythema: No Rash: No Hemosiderin Staining: No Scarring: No Mottled: No Pallor: No Moisture Rubor: No No Abnormalities Noted: No Dry / Scaly: No Temperature / Pain Maceration: No Temperature: No Abnormality Tenderness on Palpation: Yes Wound Preparation Ulcer Cleansing: Rinsed/Irrigated with Saline Topical Anesthetic Applied: None, Other:  lidocaine 4%, Treatment Notes Wound #3 (Right, Lateral Lower Leg) 2. Anesthetic Topical Lidocaine 4% cream to wound bed prior to debridement Notes SIlver collagen, abd, conform, tubigrip Electronic Signature(s) Signed: 02/21/2019 4:39:27 PM By: Army Melia Entered By: Army Melia on 02/21/2019 00:17:49 [name], [name]. ([id number]) -------------------------------------------------------------------------------- Vitals Details Patient Name: Hoque, [name]. Date of Service: 02/21/2019 12:45 PM Medical Record Number: [id number] Patient Account Number: [id number] Date of Birth/Sex: [birth year] (84 y.o. F) Treating RN: Army Melia Primary Care Sieanna Vanstone: Harrel Lemon Other Clinician: Referring Cashae Weich: Harrel Lemon Treating Ceri Mayer/Extender: Melburn Hake, HOYT Weeks in Treatment: 1 Vital Signs Time Taken: 13:01 Temperature (F): 97.9 Height (in): 64 Pulse (bpm): 95 Weight (lbs): 136.1 Respiratory Rate (breaths/min): 16 Body Mass Index (BMI): 23.4 Blood Pressure (mmHg): 126/91 Reference Range: 80 - 120 mg / dl Electronic Signature(s) Signed: 02/21/2019 4:39:27 PM By: Army Melia Entered By: Army Melia on 02/21/2019 13:01:58

## 2019-02-23 NOTE — Progress Notes (Signed)
[name] ([id number]) Visit Report for 02/21/2019 Chief Complaint Document Details Patient Name: [name]. Date of Service: 02/21/2019 12:45 PM Medical Record Number: [id number] Patient Account Number: [id number] Date of Birth/Sex: 31-Jan-1935 (84 y.o. F) Treating RN: Montey Hora Primary Care Provider: Harrel Lemon Other Clinician: Referring Provider: Harrel Lemon Treating Provider/Extender: Melburn Hake, HOYT Weeks in Treatment: 1 Information Obtained from: Patient Chief Complaint Right shin ulcer Electronic Signature(s) Signed: 02/22/2019 1:14:32 PM By: Worthy Keeler PA-C Entered By: Worthy Keeler on 02/21/2019 16:96:78 Lopiccolo, Shavonte Jenetta Downer ([id number]) -------------------------------------------------------------------------------- Debridement Details Patient Name: [name] Date of Service: 02/21/2019 12:45 PM Medical Record Number: [id number] Patient Account Number: [id number] Date of Birth/Sex: 12/03/1935 (84 y.o. F) Treating RN: Cornell Barman Primary Care Provider: Harrel Lemon Other Clinician: Referring Provider: Harrel Lemon Treating Provider/Extender: Melburn Hake, HOYT Weeks in Treatment: 1 Debridement Performed for Wound #3 Right,Lateral Lower Leg Assessment: Performed By: Physician STONE III, HOYT E., PA-C Debridement Type: Debridement Severity of Tissue Pre Fat layer exposed Debridement: Level of Consciousness (Pre- Awake and Alert procedure): Pre-procedure Verification/Time Yes - 13:19 Out Taken: Start Time: 13:19 Pain Control: Lidocaine Total Area Debrided (L x W): 0.7 (cm) x 0.5 (cm) = 0.35 (cm) Tissue and other material Non-Viable, Eschar, Other: dressing material debrided: Level: Non-Viable Tissue Debridement Description: Selective/Open Wound Instrument: Curette Bleeding: None End Time: 13:22 Procedural Pain: 3 Post Procedural Pain: 0 Response to Treatment: Procedure was tolerated well Level of Consciousness Awake and  Alert (Post-procedure): Post Debridement Measurements of Total Wound Length: (cm) 0.7 Width: (cm) 0.5 Depth: (cm) 0.2 Volume: (cm) 0.055 Character of Wound/Ulcer Post Debridement: Stable Severity of Tissue Post Debridement: Fat layer exposed Post Procedure Diagnosis Same as Pre-procedure Electronic Signature(s) Signed: 02/21/2019 6:07:40 PM By: Gretta Cool, BSN, RN, CWS, Kim RN, BSN Signed: 02/22/2019 1:14:32 PM By: Worthy Keeler PA-C Entered By: Gretta Cool, BSN, RN, CWS, Kim on 02/21/2019 24:23:53 [name] ([id number]) -------------------------------------------------------------------------------- HPI Details Patient Name: [name]. Date of Service: 02/21/2019 12:45 PM Medical Record Number: [id number] Patient Account Number: [id number] Date of Birth/Sex: November 10, 1935 (84 y.o. F) Treating RN: Montey Hora Primary Care Provider: Harrel Lemon Other Clinician: Referring Provider: Harrel Lemon Treating Provider/Extender: Melburn Hake, HOYT Weeks in Treatment: 1 History of Present Illness HPI Description: 84 year old patient who is known to have diabetes mellitus and tobacco abuse has had a injury to the right posterior calf about a month ago. He has been treated with local care and 2 courses of antibiotics which include Keflex and doxycycline which she has completed. Smokes about a pack and a half of cigarettes a day and has a past medical history of COPD, diabetes mellitus, hyperlipidemia,psoriasis and hypothyroidism. Readmission: 04/04/18 patient presents today for a initial evaluation today concerning a right forearm skin tear which has been present for about two weeks. Currently need this point has been applied to the wound bed followed by a protective dressing that has been secured by wrapping instead of adhesive's. Currently the patient's wound does seem to be doing better patient and her husband states that the excess skin from the skin tear was actually trimmed away prior  to starting the wound care they have been performing. With that being said she seems to be doing well there's no evidence of infection which is good news and overall the wound seems to be showing signs of good granulation and overall improvement which is great news. Fortunately she has no with valid bladder dysfunction unexpected fevers chills or weight loss. This  seems to be rather straightforward in my opinion in regard to treatment. 04/12/18 on evaluation today patient appears to be doing very well in regard to her right forearm skin tear. She's been tolerating the dressing changes without complication. In general this appears to show signs of great improvement since last time I saw her. Overall I'm very pleased with how things are going patient still somewhat scared about the fact that the skin that is healing over appears very fragile I explained that it does take time for this to really tough enough even once it closes over. Fortunately there is no evidence of infection. 04/19/18 on evaluation today patient appears to be doing very well in regard to her right forearm ulcer. This actually appears to be completely healed at this point which is good news. There does not appear to be any evidence of infection currently. She still has a little bit of discomfort due to some underlying irritation but overall she is doing excellent. Readmission: 02/14/19 on evaluation today patient actually appears to be doing very well all things considering in regard to her right shin ulcer which occurred as a result of a picture frame dropping onto the leg last Thursday. This is not been quite a week as of today. Because we couldn't get him today this happened she actually did go to urgent care was she with this guy Keflex for week and also a pressure dressing was applied. this has caused some issues with fluid buildup below and above the dressing unfortunately. Fortunately there does not appear to be signs of  infection at this time which is excellent news No fevers, chills, nausea, or vomiting noted at this time. 02/21/19 on evaluation today patient actually appears to be doing much better at this point in regard to her right lower Trinity ulcer. She's been tolerating the dressing changes without complication. Fortunately there's no evidence of infection at this time. No fevers, chills, nausea, or vomiting noted at this time. Electronic Signature(s) Signed: 02/22/2019 1:14:32 PM By: Worthy Keeler PA-C Entered By: Worthy Keeler on 02/21/2019 67:34:19 Iqbal, Maurine [name] ([id number]) -------------------------------------------------------------------------------- Physical Exam Details Patient Name: Hubbert, Carolie O. Date of Service: 02/21/2019 12:45 PM Medical Record Number: [id number] Patient Account Number: [id number] Date of Birth/Sex: 03-Oct-1935 (84 y.o. F) Treating RN: Montey Hora Primary Care Provider: Harrel Lemon Other Clinician: Referring Provider: Harrel Lemon Treating Provider/Extender: Melburn Hake, HOYT Weeks in Treatment: 1 Constitutional Well-nourished and well-hydrated in no acute distress. Respiratory normal breathing without difficulty. Psychiatric this patient is able to make decisions and demonstrates good insight into disease process. Alert and Oriented x 3. pleasant and cooperative. Notes Patient's wound bed currently shows evidence of good granulation at this time. Fortunately there is no evidence of infection. I did have to debride away some of the necrotic eschar from the edge of the wound along with dressing material she tolerated this with some discomfort. None of the surface of the wound had to be sharply debride at this point. Electronic Signature(s) Signed: 02/22/2019 1:14:32 PM By: Worthy Keeler PA-C Entered By: Worthy Keeler on 02/21/2019 68:34:19 Humphres, Maurine [name]  ([id number]) -------------------------------------------------------------------------------- Physician Orders Details Patient Name: [name] Date of Service: 02/21/2019 12:45 PM Medical Record Number: [id number] Patient Account Number: [id number] Date of Birth/Sex: 04-Jul-1935 (84 y.o. F) Treating RN: Cornell Barman Primary Care Provider: Harrel Lemon Other Clinician: Referring Provider: Harrel Lemon Treating Provider/Extender: Melburn Hake, HOYT Weeks in Treatment: 1 Verbal / Phone Orders: No Diagnosis Coding ICD-10 Coding  Code Description I89.0 Lymphedema, not elsewhere classified S81.801A Unspecified open wound, right lower leg, initial encounter E11.622 Type 2 diabetes mellitus with other skin ulcer Wound Cleansing Wound #3 Right,Lateral Lower Leg o Clean wound with Normal Saline. o May shower with protection. - PLEASE DO NOT GET YOUR WRAP WET Anesthetic (add to Medication List) Wound #3 Right,Lateral Lower Leg o Topical Lidocaine 4% cream applied to wound bed prior to debridement (In Clinic Only). Primary Wound Dressing Wound #3 Right,Lateral Lower Leg o Silver Collagen Secondary Dressing Wound #3 Right,Lateral Lower Leg o ABD pad o Conform/Kerlix Dressing Change Frequency Wound #3 Right,Lateral Lower Leg o Change dressing every week Follow-up Appointments Wound #3 Right,Lateral Lower Leg o Return Appointment in 1 week. o Nurse Visit as needed - PLEASE NOTIFY us IF YOUR WRAP SLIDES DOWN OR GETS WET AND WE WILL WORK YOU IN TO REWRAP YOUR LEG Edema Control Wound #3 Right,Lateral Lower Leg o Other: - Tubigrip Electronic Signature(s) DACIE, MANDEL ([id number]) Signed: 02/21/2019 6:07:40 PM By: Gretta Cool, BSN, RN, CWS, Kim RN, BSN Signed: 02/22/2019 1:14:32 PM By: Worthy Keeler PA-C Entered By: Gretta Cool, BSN, RN, CWS, Kim on 02/21/2019 51:76:16 Campione, Maurine [name]  ([id number]) -------------------------------------------------------------------------------- Problem List Details Patient Name: MACLOVIA, UHER. Date of Service: 02/21/2019 12:45 PM Medical Record Number: [id number] Patient Account Number: [id number] Date of Birth/Sex: 04-Dec-1935 (84 y.o. F) Treating RN: Montey Hora Primary Care Provider: Harrel Lemon Other Clinician: Referring Provider: Harrel Lemon Treating Provider/Extender: Melburn Hake, HOYT Weeks in Treatment: 1 Active Problems ICD-10 Evaluated Encounter Code Description Active Date Today Diagnosis I89.0 Lymphedema, not elsewhere classified 02/14/2019 No Yes S81.801A Unspecified open wound, right lower leg, initial encounter 02/14/2019 No Yes E11.622 Type 2 diabetes mellitus with other skin ulcer 02/14/2019 No Yes Inactive Problems Resolved Problems Electronic Signature(s) Signed: 02/22/2019 1:14:32 PM By: Worthy Keeler PA-C Entered By: Worthy Keeler on 02/21/2019 35:00:93 [place], Annalissa Jenetta Downer ([id number]) -------------------------------------------------------------------------------- Progress Note Details Patient Name: Icenhour, Quaneisha O. Date of Service: 02/21/2019 12:45 PM Medical Record Number: [id number] Patient Account Number: [id number] Date of Birth/Sex: June 05, 1935 (84 y.o. F) Treating RN: Montey Hora Primary Care Provider: Harrel Lemon Other Clinician: Referring Provider: Harrel Lemon Treating Provider/Extender: Melburn Hake, HOYT Weeks in Treatment: 1 Subjective Chief Complaint Information obtained from Patient Right shin ulcer History of Present Illness (HPI) 84 year old patient who is known to have diabetes mellitus and tobacco abuse has had a injury to the right posterior calf about a month ago. He has been treated with local care and 2 courses of antibiotics which include Keflex and doxycycline which she has completed. Smokes about a pack and a half of cigarettes a day and has a past medical history of COPD,  diabetes mellitus, hyperlipidemia,psoriasis and hypothyroidism. Readmission: 04/04/18 patient presents today for a initial evaluation today concerning a right forearm skin tear which has been present for about two weeks. Currently need this point has been applied to the wound bed followed by a protective dressing that has been secured by wrapping instead of adhesive's. Currently the patient's wound does seem to be doing better patient and her husband states that the excess skin from the skin tear was actually trimmed away prior to starting the wound care they have been performing. With that being said she seems to be doing well there's no evidence of infection which is good news and overall the wound seems to be showing signs of good granulation and overall improvement which is great news. Fortunately she has no with valid bladder  dysfunction unexpected fevers chills or weight loss. This seems to be rather straightforward in my opinion in regard to treatment. 04/12/18 on evaluation today patient appears to be doing very well in regard to her right forearm skin tear. She's been tolerating the dressing changes without complication. In general this appears to show signs of great improvement since last time I saw her. Overall I'm very pleased with how things are going patient still somewhat scared about the fact that the skin that is healing over appears very fragile I explained that it does take time for this to really tough enough even once it closes over. Fortunately there is no evidence of infection. 04/19/18 on evaluation today patient appears to be doing very well in regard to her right forearm ulcer. This actually appears to be completely healed at this point which is good news. There does not appear to be any evidence of infection currently. She still has a little bit of discomfort due to some underlying irritation but overall she is doing excellent. Readmission: 02/14/19 on evaluation today  patient actually appears to be doing very well all things considering in regard to her right shin ulcer which occurred as a result of a picture frame dropping onto the leg last Thursday. This is not been quite a week as of today. Because we couldn't get him today this happened she actually did go to urgent care was she with this guy Keflex for week and also a pressure dressing was applied. this has caused some issues with fluid buildup below and above the dressing unfortunately. Fortunately there does not appear to be signs of infection at this time which is excellent news No fevers, chills, nausea, or vomiting noted at this time. 02/21/19 on evaluation today patient actually appears to be doing much better at this point in regard to her right lower Trinity ulcer. She's been tolerating the dressing changes without complication. Fortunately there's no evidence of infection at this time. No fevers, chills, nausea, or vomiting noted at this time. CORDIE, BEAZLEY ([id number]) Patient History Information obtained from Patient. Family History Heart Disease - Mother, No family history of Cancer, Diabetes, Hereditary Spherocytosis, Hypertension, Kidney Disease, Lung Disease, Seizures, Stroke, Thyroid Problems, Tuberculosis. Social History Current every day smoker - 1 1.5 a day, Marital Status - Married, Alcohol Use - Never, Drug Use - No History, Caffeine Use - Daily. Medical History Eyes Patient has history of Cataracts - removed bilaterally Denies history of Glaucoma, Optic Neuritis Ear/Nose/Mouth/Throat Denies history of Chronic sinus problems/congestion, Middle ear problems Hematologic/Lymphatic Patient has history of Anemia Denies history of Hemophilia, Human Immunodeficiency Virus, Lymphedema, Sickle Cell Disease Respiratory Patient has history of Chronic Obstructive Pulmonary Disease (COPD) Denies history of Aspiration, Asthma, Pneumothorax, Sleep Apnea,  Tuberculosis Cardiovascular Denies history of Angina, Arrhythmia, Congestive Heart Failure, Coronary Artery Disease, Deep Vein Thrombosis, Hypertension, Hypotension, Myocardial Infarction, Peripheral Arterial Disease, Peripheral Venous Disease, Phlebitis, Vasculitis Gastrointestinal Denies history of Cirrhosis , Colitis, Crohn s, Hepatitis A, Hepatitis B, Hepatitis C Endocrine Patient has history of Type II Diabetes Denies history of Type I Diabetes Immunological Denies history of Lupus Erythematosus, Raynaud s, Scleroderma Integumentary (Skin) Denies history of History of Burn, History of pressure wounds Musculoskeletal Denies history of Gout, Rheumatoid Arthritis, Osteoarthritis, Osteomyelitis Neurologic Denies history of Dementia, Neuropathy, Quadriplegia, Paraplegia, Seizure Disorder Oncologic Patient has history of Received Radiation - 2015 Denies history of Received Chemotherapy Medical And Surgical History Notes Cardiovascular hyperlipidemia Review of Systems (ROS) Constitutional Symptoms (General Health) Denies complaints or  symptoms of Fever, Chills. Respiratory The patient has no complaints or symptoms. Cardiovascular The patient has no complaints or symptoms. Psychiatric The patient has no complaints or symptoms. EMI, LYMON ([id number]) Objective Constitutional Well-nourished and well-hydrated in no acute distress. Vitals Time Taken: 1:01 PM, Height: 64 in, Weight: 136.1 lbs, BMI: 23.4, Temperature: 97.9 F, Pulse: 95 bpm, Respiratory Rate: 16 breaths/min, Blood Pressure: 126/91 mmHg. Respiratory normal breathing without difficulty. Psychiatric this patient is able to make decisions and demonstrates good insight into disease process. Alert and Oriented x 3. pleasant and cooperative. General Notes: Patient's wound bed currently shows evidence of good granulation at this time. Fortunately there is no evidence of infection. I did have to debride away some of  the necrotic eschar from the edge of the wound along with dressing material she tolerated this with some discomfort. None of the surface of the wound had to be sharply debride at this point. Integumentary (Hair, Skin) Wound #3 status is Open. Original cause of wound was Trauma. The wound is located on the Right,Lateral Lower Leg. The wound measures 0.7cm length x 0.5cm width x 0.1cm depth; 0.275cm^2 area and 0.027cm^3 volume. There is Fat Layer (Subcutaneous Tissue) Exposed exposed. There is no tunneling or undermining noted. There is a medium amount of serosanguineous drainage noted. The wound margin is flat and intact. There is medium (34-66%) red granulation within the wound bed. There is a medium (34-66%) amount of necrotic tissue within the wound bed including Eschar and Adherent Slough. The periwound skin appearance did not exhibit: Callus, Crepitus, Excoriation, Induration, Rash, Scarring, Dry/Scaly, Maceration, Atrophie Blanche, Cyanosis, Ecchymosis, Hemosiderin Staining, Mottled, Pallor, Rubor, Erythema. Periwound temperature was noted as No Abnormality. The periwound has tenderness on palpation. Assessment Active Problems ICD-10 Lymphedema, not elsewhere classified Unspecified open wound, right lower leg, initial encounter Type 2 diabetes mellitus with other skin ulcer Procedures Wound #3 [place], Taquilla O. ([id number]) Pre-procedure diagnosis of Wound #3 is a Diabetic Wound/Ulcer of the Lower Extremity located on the Right,Lateral Lower Leg .Severity of Tissue Pre Debridement is: Fat layer exposed. There was a Selective/Open Wound Non-Viable Tissue Debridement with a total area of 0.35 sq cm performed by STONE III, HOYT E., PA-C. With the following instrument(s): Curette to remove Non-Viable tissue/material. Material removed includes Eschar and Other: dressing material after achieving pain control using Lidocaine. No specimens were taken. A time out was conducted at 13:19, prior to  the start of the procedure. There was no bleeding. The procedure was tolerated well with a pain level of 3 throughout and a pain level of 0 following the procedure. Post Debridement Measurements: 0.7cm length x 0.5cm width x 0.2cm depth; 0.055cm^3 volume. Character of Wound/Ulcer Post Debridement is stable. Severity of Tissue Post Debridement is: Fat layer exposed. Post procedure Diagnosis Wound #3: Same as Pre-Procedure Plan Wound Cleansing: Wound #3 Right,Lateral Lower Leg: Clean wound with Normal Saline. May shower with protection. - PLEASE DO NOT GET YOUR WRAP WET Anesthetic (add to Medication List): Wound #3 Right,Lateral Lower Leg: Topical Lidocaine 4% cream applied to wound bed prior to debridement (In Clinic Only). Primary Wound Dressing: Wound #3 Right,Lateral Lower Leg: Silver Collagen Secondary Dressing: Wound #3 Right,Lateral Lower Leg: ABD pad Conform/Kerlix Dressing Change Frequency: Wound #3 Right,Lateral Lower Leg: Change dressing every week Follow-up Appointments: Wound #3 Right,Lateral Lower Leg: Return Appointment in 1 week. Nurse Visit as needed - PLEASE NOTIFY us IF YOUR WRAP SLIDES DOWN OR GETS WET AND WE WILL WORK YOU IN TO REWRAP YOUR  LEG Edema Control: Wound #3 Right,Lateral Lower Leg: Other: - Tubigrip My suggestion currently is gonna be that we go ahead and continue with the above wound care measures for the next week and the patient is in agreement the plan. We will subsequently see were things stand at follow-up. Please see above for specific wound care orders. We will see patient for re-evaluation in 1 week(s) here in the clinic. If anything worsens or changes patient will contact our office for additional recommendations. Electronic Signature(s) Signed: 02/22/2019 1:14:32 PM By: Worthy Keeler PA-C Entered By: Worthy Keeler on 02/21/2019 00:93:81 [place], Qiara Jenetta Downer ([id number]) JAZELLE, ACHEY  ([id number]) -------------------------------------------------------------------------------- ROS/PFSH Details Patient Name: EMMA-LEE, ODDO. Date of Service: 02/21/2019 12:45 PM Medical Record Number: [id number] Patient Account Number: [id number] Date of Birth/Sex: 09/24/1935 (84 y.o. F) Treating RN: Montey Hora Primary Care Provider: Harrel Lemon Other Clinician: Referring Provider: Harrel Lemon Treating Provider/Extender: Melburn Hake, HOYT Weeks in Treatment: 1 Information Obtained From Patient Wound History Do you currently have one or more open woundso Yes How many open wounds do you currently haveo 1 Approximately how long have you had your woundso 1 How have you been treating your wound(s) until nowo presssure dressing Has your wound(s) ever healed and then re-openedo No Have you had any lab work done in the past montho No Have you tested positive for an antibiotic resistant organism (MRSA, VRE)o No Have you tested positive for osteomyelitis (bone infection)o No Have you had any tests for circulation on your legso No Constitutional Symptoms (General Health) Complaints and Symptoms: Negative for: Fever; Chills Eyes Medical History: Positive for: Cataracts - removed bilaterally Negative for: Glaucoma; Optic Neuritis Ear/Nose/Mouth/Throat Medical History: Negative for: Chronic sinus problems/congestion; Middle ear problems Hematologic/Lymphatic Medical History: Positive for: Anemia Negative for: Hemophilia; Human Immunodeficiency Virus; Lymphedema; Sickle Cell Disease Respiratory Complaints and Symptoms: No Complaints or Symptoms Medical History: Positive for: Chronic Obstructive Pulmonary Disease (COPD) Negative for: Aspiration; Asthma; Pneumothorax; Sleep Apnea; Tuberculosis Cardiovascular Complaints and Symptoms: No Complaints or Symptoms Castagnola, Madilynne O. ([id number]) Medical History: Negative for: Angina; Arrhythmia; Congestive Heart Failure; Coronary Artery  Disease; Deep Vein Thrombosis; Hypertension; Hypotension; Myocardial Infarction; Peripheral Arterial Disease; Peripheral Venous Disease; Phlebitis; Vasculitis Past Medical History Notes: hyperlipidemia Gastrointestinal Medical History: Negative for: Cirrhosis ; Colitis; Crohnos; Hepatitis A; Hepatitis B; Hepatitis C Endocrine Medical History: Positive for: Type II Diabetes Negative for: Type I Diabetes Time with diabetes: 15 years Treated with: Oral agents Blood sugar tested every day: No Immunological Medical History: Negative for: Lupus Erythematosus; Raynaudos; Scleroderma Integumentary (Skin) Medical History: Negative for: History of Burn; History of pressure wounds Musculoskeletal Medical History: Negative for: Gout; Rheumatoid Arthritis; Osteoarthritis; Osteomyelitis Neurologic Medical History: Negative for: Dementia; Neuropathy; Quadriplegia; Paraplegia; Seizure Disorder Oncologic Medical History: Positive for: Received Radiation - 2015 Negative for: Received Chemotherapy Psychiatric Complaints and Symptoms: No Complaints or Symptoms HBO Extended History Items Eyes: Cataracts Immunizations Pneumococcal Vaccine: DANETT, PALAZZO ([id number]) Received Pneumococcal Vaccination: Yes Implantable Devices None Family and Social History Cancer: No; Diabetes: No; Heart Disease: Yes - Mother; Hereditary Spherocytosis: No; Hypertension: No; Kidney Disease: No; Lung Disease: No; Seizures: No; Stroke: No; Thyroid Problems: No; Tuberculosis: No; Current every day smoker - 1 1.5 a day; Marital Status - Married; Alcohol Use: Never; Drug Use: No History; Caffeine Use: Daily; Financial Concerns: No; Food, Clothing or Shelter Needs: No; Support System Lacking: No; Transportation Concerns: No; Advanced Directives: No; Patient does not want information on Advanced Directives; Do not resuscitate: No; Living  Will: Yes (Not Provided); Medical Power of Attorney: No Physician  Affirmation I have reviewed and agree with the above information. Electronic Signature(s) Signed: 02/22/2019 1:14:32 PM By: Worthy Keeler PA-C Signed: 02/22/2019 4:23:17 PM By: Montey Hora Entered By: Worthy Keeler on 02/21/2019 25:95:63 [place], [place]. ([id number]) -------------------------------------------------------------------------------- SuperBill Details Patient Name: Solorzano, Maurine [name] Date of Service: 02/21/2019 Medical Record Number: [id number] Patient Account Number: [id number] Date of Birth/Sex: 31-May-1935 (84 y.o. F) Treating RN: Montey Hora Primary Care Provider: Harrel Lemon Other Clinician: Referring Provider: Harrel Lemon Treating Provider/Extender: Melburn Hake, HOYT Weeks in Treatment: 1 Diagnosis Coding ICD-10 Codes Code Description I89.0 Lymphedema, not elsewhere classified S81.801A Unspecified open wound, right lower leg, initial encounter E11.622 Type 2 diabetes mellitus with other skin ulcer Facility Procedures CPT4 Code: 63016010 Description: [phone] - DEBRIDE WOUND 1ST 20 SQ CM OR < ICD-10 Diagnosis Description S81.801A Unspecified open wound, right lower leg, initial encounter Modifier: Quantity: 1 Physician Procedures CPT4 Code: 5732202 Description: 54270 - WC PHYS DEBR WO ANESTH 20 SQ CM ICD-10 Diagnosis Description S81.801A Unspecified open wound, right lower leg, initial encounter Modifier: Quantity: 1 Electronic Signature(s) Signed: 02/22/2019 1:14:32 PM By: Worthy Keeler PA-C Entered By: Worthy Keeler on 02/21/2019 13:51:22

## 2019-02-28 ENCOUNTER — Other Ambulatory Visit: Payer: Self-pay

## 2019-02-28 ENCOUNTER — Encounter: Payer: Medicare Other | Admitting: Physician Assistant

## 2019-02-28 DIAGNOSIS — E11622 Type 2 diabetes mellitus with other skin ulcer: Secondary | ICD-10-CM | POA: Diagnosis not present

## 2019-03-01 NOTE — Progress Notes (Addendum)
AURORE, REDINGER ([id number]) Visit Report for 02/28/2019 Arrival Information Details Patient Name: LLOYD, CULLINAN. Date of Service: 02/28/2019 1:45 PM Medical Record Number: [id number] Patient Account Number: [id number] Date of Birth/Sex: 1935/09/30 (84 y.o. F) Treating RN: Army Melia Primary Care Estanislado Surgeon: Harrel Lemon Other Clinician: Referring Chantalle Defilippo: Harrel Lemon Treating Nakia Koble/Extender: Melburn Hake, HOYT Weeks in Treatment: 2 Visit Information History Since Last Visit Added or deleted any medications: No Patient Arrived: Ambulatory Any new allergies or adverse reactions: No Arrival Time: 13:52 Had a fall or experienced change in No Accompanied By: self activities of daily living that may affect Transfer Assistance: None risk of falls: Patient Requires Transmission-Based No Signs or symptoms of abuse/neglect since last visito No Precautions: Hospitalized since last visit: No Patient Has Alerts: Yes Implantable device outside of the clinic excluding No Patient Alerts: Patient on Blood cellular tissue based products placed in the center Thinner since last visit: Aspirin 81 mg Has Dressing in Place as Prescribed: Yes DM II ABI: (L)1.13 (R) Pain Present Now: No 1.19 Electronic Signature(s) Signed: 02/28/2019 3:57:00 PM By: Army Melia Entered By: Army Melia on 02/28/2019 32:44:01 Mcclenney, Shynice Jenetta Downer ([id number]) -------------------------------------------------------------------------------- Encounter Discharge Information Details Patient Name: [name] Date of Service: 02/28/2019 1:45 PM Medical Record Number: [id number] Patient Account Number: [id number] Date of Birth/Sex: 04-15-1935 (84 y.o. F) Treating RN: Montey Hora Primary Care Kaliyan Osbourn: Harrel Lemon Other Clinician: Referring Natania Finigan: Harrel Lemon Treating Terre Zabriskie/Extender: Melburn Hake, HOYT Weeks in Treatment: 2 Encounter Discharge Information Items Post Procedure Vitals Discharge  Condition: Stable Temperature (F): 98.1 Ambulatory Status: Ambulatory Pulse (bpm): 90 Discharge Destination: Home Respiratory Rate (breaths/min): 16 Transportation: Private Auto Blood Pressure (mmHg): 144/59 Accompanied By: self Schedule Follow-up Appointment: Yes Clinical Summary of Care: Electronic Signature(s) Signed: 02/28/2019 2:28:57 PM By: Harold Barban Entered By: Harold Barban on 02/28/2019 56:38:75 Shartzer, Macala Jenetta Downer ([id number]) -------------------------------------------------------------------------------- Lower Extremity Assessment Details Patient Name: Carn, [name]. Date of Service: 02/28/2019 1:45 PM Medical Record Number: [id number] Patient Account Number: [id number] Date of Birth/Sex: October 23, 1935 (84 y.o. F) Treating RN: Army Melia Primary Care Ryosuke Ericksen: Harrel Lemon Other Clinician: Referring Anarely Nicholls: Harrel Lemon Treating Tarique Loveall/Extender: Melburn Hake, HOYT Weeks in Treatment: 2 Edema Assessment Assessed: [Left: No] [Right: No] Edema: [Left: N] [Right: o] Electronic Signature(s) Signed: 02/28/2019 3:57:00 PM By: Army Melia Entered By: Army Melia on 02/28/2019 16:01:09 [place], [place] ([id number]) -------------------------------------------------------------------------------- Multi Wound Chart Details Patient Name: Shimp, [name]. Date of Service: 02/28/2019 1:45 PM Medical Record Number: [id number] Patient Account Number: [id number] Date of Birth/Sex: 14-Jun-1935 (84 y.o. F) Treating RN: Montey Hora Primary Care Jimmi Sidener: Harrel Lemon Other Clinician: Referring Deziyah Arvin: Harrel Lemon Treating Madysin Crisp/Extender: Melburn Hake, HOYT Weeks in Treatment: 2 Vital Signs Height(in): 64 Pulse(bpm): 90 Weight(lbs): 136.1 Blood Pressure(mmHg): 144/59 Body Mass Index(BMI): 23 Temperature(F): 98.1 Respiratory Rate 16 (breaths/min): Photos: [3:No Photos] [N/A:N/A] Wound Location: [3:Right Lower Leg - Lateral] [N/A:N/A] Wounding Event:  [3:Trauma] [N/A:N/A] Primary Etiology: [3:Diabetic Wound/Ulcer of the Lower Extremity] [N/A:N/A] Secondary Etiology: [3:Skin Tear] [N/A:N/A] Comorbid History: [3:Cataracts, Anemia, Chronic Obstructive Pulmonary Disease (COPD), Type II Diabetes, Received Radiation] [N/A:N/A] Date Acquired: [3:02/09/2019] [N/A:N/A] Weeks of Treatment: [3:2] [N/A:N/A] Wound Status: [3:Open] [N/A:N/A] Measurements L x W x D [3:0.5x0.8x0.1] [N/A:N/A] (cm) Area (cm) : [3:0.314] [N/A:N/A] Volume (cm) : [3:0.031] [N/A:N/A] % Reduction in Area: [3:75.80%] [N/A:N/A] % Reduction in Volume: [3:76.20%] [N/A:N/A] Classification: [3:Grade 2] [N/A:N/A] Exudate Amount: [3:Medium] [N/A:N/A] Exudate Type: [3:Serosanguineous] [N/A:N/A] Exudate Color: [3:red, brown] [N/A:N/A] Wound Margin: [3:Flat and Intact] [N/A:N/A] Granulation Amount: [3:Medium (34-66%)] [N/A:N/A] Granulation  Quality: [3:Red] [N/A:N/A] Necrotic Amount: [3:Medium (34-66%)] [N/A:N/A] Necrotic Tissue: [3:Eschar, Adherent Slough] [N/A:N/A] Exposed Structures: [3:Fat Layer (Subcutaneous Tissue) Exposed: Yes Fascia: No Tendon: No Muscle: No Joint: No Bone: No] [N/A:N/A] Epithelialization: [3:None] [N/A:N/A] Wound Preparation: [N/A:N/A] Ulcer Cleansing: Rinsed/Irrigated with Saline Topical Anesthetic Applied: None, Other: lidocaine 4% Treatment Notes Electronic Signature(s) Signed: 02/28/2019 4:44:55 PM By: Montey Hora Entered By: Montey Hora on 02/28/2019 32:20:25 [place], [name] ([id number]) -------------------------------------------------------------------------------- Multi-Disciplinary Care Plan Details Patient Name: [name] Date of Service: 02/28/2019 1:45 PM Medical Record Number: [id number] Patient Account Number: [id number] Date of Birth/Sex: [birth year] (84 y.o. F) Treating RN: Montey Hora Primary Care Darlis Wragg: Harrel Lemon Other Clinician: Referring Almin Livingstone: Harrel Lemon Treating Jaylynn Siefert/Extender: Melburn Hake,  HOYT Weeks in Treatment: 2 Active Inactive Electronic Signature(s) Signed: 06/01/2019 8:50:36 AM By: Gretta Cool, BSN, RN, CWS, Kim RN, BSN Signed: 08/11/2019 1:04:33 PM By: Montey Hora Previous Signature: 02/28/2019 4:44:55 PM Version By: Montey Hora Entered By: Gretta Cool BSN, RN, CWS, Kim on 06/01/2019 60:73:71 Giannini, [name] ([id number]) -------------------------------------------------------------------------------- Pain Assessment Details Patient Name: MELLISA, ARSHAD. Date of Service: 02/28/2019 1:45 PM Medical Record Number: [id number] Patient Account Number: [id number] Date of Birth/Sex: 09-21-1935 (84 y.o. F) Treating RN: Army Melia Primary Care Haruko Mersch: Harrel Lemon Other Clinician: Referring Yicel Shannon: Harrel Lemon Treating Alioune Hodgkin/Extender: Melburn Hake, HOYT Weeks in Treatment: 2 Active Problems Location of Pain Severity and Description of Pain Patient Has Paino No Site Locations Pain Management and Medication Current Pain Management: Electronic Signature(s) Signed: 02/28/2019 3:57:00 PM By: Army Melia Entered By: Army Melia on 02/28/2019 38:18:29 [place]ab, [name] ([id number]) -------------------------------------------------------------------------------- Patient/Caregiver Education Details Patient Name: [name] Date of Service: 02/28/2019 1:45 PM Medical Record Number: [id number] Patient Account Number: [id number] Date of Birth/Gender: June 15, 1935 (84 y.o. F) Treating RN: Montey Hora Primary Care Physician: Harrel Lemon Other Clinician: Referring Physician: Harrel Lemon Treating Physician/Extender: Sharalyn Ink in Treatment: 2 Education Assessment Education Provided To: Patient Education Topics Provided Wound/Skin Impairment: Handouts: Other: wound care as ordered Methods: Demonstration, Explain/Verbal Responses: State content correctly Electronic Signature(s) Signed: 02/28/2019 4:48:10 PM By: Harold Barban Entered By: Harold Barban on 02/28/2019 02:58:52 [place], Marriana Jenetta Downer ([id number]) -------------------------------------------------------------------------------- Wound Assessment Details Patient Name: Kloos, [name]. Date of Service: 02/28/2019 1:45 PM Medical Record Number: [id number] Patient Account Number: [id number] Date of Birth/Sex: 1935/04/08 (84 y.o. F) Treating RN: Army Melia Primary Care Devora Tortorella: Harrel Lemon Other Clinician: Referring Tykera Skates: Harrel Lemon Treating Malynn Lucy/Extender: Melburn Hake, HOYT Weeks in Treatment: 2 Wound Status Wound Number: 3 Primary Diabetic Wound/Ulcer of the Lower Extremity Etiology: Wound Location: Right Lower Leg - Lateral Secondary Skin Tear Wounding Event: Trauma Etiology: Date Acquired: 02/09/2019 Wound Open Weeks Of Treatment: 2 Status: Clustered Wound: No Comorbid Cataracts, Anemia, Chronic Obstructive History: Pulmonary Disease (COPD), Type II Diabetes, Received Radiation Wound Measurements Length: (cm) 0.5 Width: (cm) 0.8 Depth: (cm) 0.1 Area: (cm) 0.314 Volume: (cm) 0.031 % Reduction in Area: 75.8% % Reduction in Volume: 76.2% Epithelialization: None Tunneling: No Undermining: No Wound Description Classification: Grade 2 Wound Margin: Flat and Intact Exudate Amount: Medium Exudate Type: Serosanguineous Exudate Color: red, brown Foul Odor After Cleansing: No Slough/Fibrino Yes Wound Bed Granulation Amount: Medium (34-66%) Exposed Structure Granulation Quality: Red Fascia Exposed: No Necrotic Amount: Medium (34-66%) Fat Layer (Subcutaneous Tissue) Exposed: Yes Necrotic Quality: Eschar, Adherent Slough Tendon Exposed: No Muscle Exposed: No Joint Exposed: No Bone Exposed: No Periwound Skin Texture Texture Color No Abnormalities Noted: No No Abnormalities Noted: No Callus: No Atrophie Blanche: No Crepitus: No  Cyanosis: No Excoriation: No Ecchymosis: No Induration: No Erythema: No Rash: No Hemosiderin Staining:  No Scarring: No Mottled: No Pallor: No Moisture Rubor: No No Abnormalities Noted: No Franqui, Shaquasha O. ([id number]) Dry / Scaly: No Temperature / Pain Maceration: No Temperature: No Abnormality Tenderness on Palpation: Yes Wound Preparation Ulcer Cleansing: Rinsed/Irrigated with Saline Topical Anesthetic Applied: None, Other: lidocaine 4%, Electronic Signature(s) Signed: 02/28/2019 3:57:00 PM By: Army Melia Entered By: Army Melia on 02/28/2019 43:27:61 Le[place], Saleena Jenetta Downer ([id number]) -------------------------------------------------------------------------------- Vitals Details Patient Name: Barraclough, [name]. Date of Service: 02/28/2019 1:45 PM Medical Record Number: [id number] Patient Account Number: [id number] Date of Birth/Sex: [birth year] (84 y.o. F) Treating RN: Army Melia Primary Care Erdine Hulen: Harrel Lemon Other Clinician: Referring Imaad Reuss: Harrel Lemon Treating Kerwin Augustus/Extender: Melburn Hake, HOYT Weeks in Treatment: 2 Vital Signs Time Taken: 13:53 Temperature (F): 98.1 Height (in): 64 Pulse (bpm): 90 Weight (lbs): 136.1 Respiratory Rate (breaths/min): 16 Body Mass Index (BMI): 23.4 Blood Pressure (mmHg): 144/59 Reference Range: 80 - 120 mg / dl Electronic Signature(s) Signed: 02/28/2019 3:57:00 PM By: Army Melia Entered By: Army Melia on 02/28/2019 13:53:41

## 2019-03-01 NOTE — Progress Notes (Signed)
TERIN, DIEROLF ([id number]) Visit Report for 02/28/2019 Chief Complaint Document Details Patient Name: [name], [name]. Date of Service: 02/28/2019 1:45 PM Medical Record Number: [id number] Patient Account Number: [id number] Date of Birth/Sex: Mar 31, 1935 (84 y.o. F) Treating RN: Montey Hora Primary Care Provider: Harrel Lemon Other Clinician: Referring Provider: Harrel Lemon Treating Provider/Extender: Melburn Hake, HOYT Weeks in Treatment: 2 Information Obtained from: Patient Chief Complaint Right shin ulcer Electronic Signature(s) Signed: 02/28/2019 5:25:20 PM By: Worthy Keeler PA-C Entered By: Worthy Keeler on 02/28/2019 82:42:35 Batz, [name] ([id number]) -------------------------------------------------------------------------------- Debridement Details Patient Name: [name] Date of Service: 02/28/2019 1:45 PM Medical Record Number: [id number] Patient Account Number: [id number] Date of Birth/Sex: October 28, 1935 (84 y.o. F) Treating RN: Montey Hora Primary Care Provider: Harrel Lemon Other Clinician: Referring Provider: Harrel Lemon Treating Provider/Extender: Melburn Hake, HOYT Weeks in Treatment: 2 Debridement Performed for Wound #3 Right,Lateral Lower Leg Assessment: Performed By: Physician STONE III, HOYT E., PA-C Debridement Type: Debridement Severity of Tissue Pre Fat layer exposed Debridement: Level of Consciousness (Pre- Awake and Alert procedure): Pre-procedure Verification/Time Yes - 14:14 Out Taken: Start Time: 14:14 Pain Control: Lidocaine 4% Topical Solution Total Area Debrided (L x W): 0.5 (cm) x 0.8 (cm) = 0.4 (cm) Tissue and other material Non-Viable, Skin: Dermis , Skin: Epidermis, Other: adhered dressing debrided: Level: Skin/Epidermis Debridement Description: Selective/Open Wound Instrument: Curette Bleeding: None Procedural Pain: 0 Post Procedural Pain: 0 Response to Treatment: Procedure was tolerated well Level of  Consciousness Awake and Alert (Post-procedure): Post Debridement Measurements of Total Wound Length: (cm) 0.5 Width: (cm) 0.8 Depth: (cm) 0.1 Volume: (cm) 0.031 Character of Wound/Ulcer Post Debridement: Improved Severity of Tissue Post Debridement: Fat layer exposed Post Procedure Diagnosis Same as Pre-procedure Electronic Signature(s) Signed: 02/28/2019 4:44:55 PM By: Montey Hora Signed: 02/28/2019 5:25:20 PM By: Worthy Keeler PA-C Entered By: Montey Hora on 02/28/2019 09:32:67 Grand, [place]. ([id number]) -------------------------------------------------------------------------------- HPI Details Patient Name: [name], [name]. Date of Service: 02/28/2019 1:45 PM Medical Record Number: [id number] Patient Account Number: [id number] Date of Birth/Sex: 10/22/1935 (84 y.o. F) Treating RN: Montey Hora Primary Care Provider: Harrel Lemon Other Clinician: Referring Provider: Harrel Lemon Treating Provider/Extender: Melburn Hake, HOYT Weeks in Treatment: 2 History of Present Illness HPI Description: 84 year old patient who is known to have diabetes mellitus and tobacco abuse has had a injury to the right posterior calf about a month ago. He has been treated with local care and 2 courses of antibiotics which include Keflex and doxycycline which she has completed. Smokes about a pack and a half of cigarettes a day and has a past medical history of COPD, diabetes mellitus, hyperlipidemia,psoriasis and hypothyroidism. Readmission: 04/04/18 patient presents today for a initial evaluation today concerning a right forearm skin tear which has been present for about two weeks. Currently need this point has been applied to the wound bed followed by a protective dressing that has been secured by wrapping instead of adhesive's. Currently the patient's wound does seem to be doing better patient and her husband states that the excess skin from the skin tear was actually trimmed away prior  to starting the wound care they have been performing. With that being said she seems to be doing well there's no evidence of infection which is good news and overall the wound seems to be showing signs of good granulation and overall improvement which is great news. Fortunately she has no with valid bladder dysfunction unexpected fevers chills or weight loss. This seems to be rather straightforward  in my opinion in regard to treatment. 04/12/18 on evaluation today patient appears to be doing very well in regard to her right forearm skin tear. She's been tolerating the dressing changes without complication. In general this appears to show signs of great improvement since last time I saw her. Overall I'm very pleased with how things are going patient still somewhat scared about the fact that the skin that is healing over appears very fragile I explained that it does take time for this to really tough enough even once it closes over. Fortunately there is no evidence of infection. 04/19/18 on evaluation today patient appears to be doing very well in regard to her right forearm ulcer. This actually appears to be completely healed at this point which is good news. There does not appear to be any evidence of infection currently. She still has a little bit of discomfort due to some underlying irritation but overall she is doing excellent. Readmission: 02/14/19 on evaluation today patient actually appears to be doing very well all things considering in regard to her right shin ulcer which occurred as a result of a picture frame dropping onto the leg last Thursday. This is not been quite a week as of today. Because we couldn't get him today this happened she actually did go to urgent care was she with this guy Keflex for week and also a pressure dressing was applied. this has caused some issues with fluid buildup below and above the dressing unfortunately. Fortunately there does not appear to be signs of  infection at this time which is excellent news No fevers, chills, nausea, or vomiting noted at this time. 02/21/19 on evaluation today patient actually appears to be doing much better at this point in regard to her right lower Trinity ulcer. She's been tolerating the dressing changes without complication. Fortunately there's no evidence of infection at this time. No fevers, chills, nausea, or vomiting noted at this time. 02/28/19 on evaluation today patient actually appears to be doing very well in regard to her lower extremity ulcer. She has been tolerating the dressing changes without complication. Fortunately this seems to be doing significantly better which is great news. Overall I'm very pleased with the progress. I do believe that the patient is doing excellent overall from the standpoint of her wound although there was some dressing stuck to the surface of the wound that did require sharp debridement today. EVELYNA, FOLKER ([id number]) Electronic Signature(s) Signed: 02/28/2019 5:25:20 PM By: Worthy Keeler PA-C Entered By: Worthy Keeler on 02/28/2019 25:85:27 Dorff, [name] ([id number]) -------------------------------------------------------------------------------- Physical Exam Details Patient Name: [name], [name] Date of Service: 02/28/2019 1:45 PM Medical Record Number: [id number] Patient Account Number: [id number] Date of Birth/Sex: 05-Sep-1935 (84 y.o. F) Treating RN: Montey Hora Primary Care Provider: Harrel Lemon Other Clinician: Referring Provider: Harrel Lemon Treating Provider/Extender: Melburn Hake, HOYT Weeks in Treatment: 2 Constitutional Well-nourished and well-hydrated in no acute distress. Respiratory normal breathing without difficulty. Psychiatric this patient is able to make decisions and demonstrates good insight into disease process. Alert and Oriented x 3. pleasant and cooperative. Notes I was able to debride away the dried dressing material as well  as the eschar from the surface of the wound and the patient actually appears to have a lot of good healing underneath this. Her swelling is doing very well today I do believe that Tubigrip was beneficial in that regard. Nonetheless I'm gonna suggest that we continue with the above wound care measures for the  next week and the patient is in agreement with that plan. Electronic Signature(s) Signed: 02/28/2019 5:25:20 PM By: Worthy Keeler PA-C Entered By: Worthy Keeler on 02/28/2019 16:10:96 Kimpel, [name] ([id number]) -------------------------------------------------------------------------------- Physician Orders Details Patient Name: [name] Date of Service: 02/28/2019 1:45 PM Medical Record Number: [id number] Patient Account Number: [id number] Date of Birth/Sex: 03-19-1935 (84 y.o. F) Treating RN: Montey Hora Primary Care Provider: Harrel Lemon Other Clinician: Referring Provider: Harrel Lemon Treating Provider/Extender: Melburn Hake, HOYT Weeks in Treatment: 2 Verbal / Phone Orders: No Diagnosis Coding ICD-10 Coding Code Description I89.0 Lymphedema, not elsewhere classified S81.801A Unspecified open wound, right lower leg, initial encounter E11.622 Type 2 diabetes mellitus with other skin ulcer Wound Cleansing Wound #3 Right,Lateral Lower Leg o Clean wound with Normal Saline. o May Shower, gently pat wound dry prior to applying new dressing. Anesthetic (add to Medication List) Wound #3 Right,Lateral Lower Leg o Topical Lidocaine 4% cream applied to wound bed prior to debridement (In Clinic Only). Primary Wound Dressing Wound #3 Right,Lateral Lower Leg o Xeroform Secondary Dressing Wound #3 Right,Lateral Lower Leg o Conform/Kerlix o Non-adherent pad Dressing Change Frequency Wound #3 Right,Lateral Lower Leg o Change dressing every other day. Follow-up Appointments Wound #3 Right,Lateral Lower Leg o Return Appointment in 1 week. Edema  Control Wound #3 Right,Lateral Lower Leg o Other: - Tubigrip F Electronic Signature(s) Signed: 02/28/2019 4:44:55 PM By: Montey Hora Signed: 02/28/2019 5:25:20 PM By: Bertha Stakes, Annmargaret O. ([id number]) Entered By: Montey Hora on 02/28/2019 46:96:29 [name], [name] ([id number]) -------------------------------------------------------------------------------- Problem List Details Patient Name: [name], [name] Date of Service: 02/28/2019 1:45 PM Medical Record Number: [id number] Patient Account Number: [id number] Date of Birth/Sex: Dec 03, 1935 (84 y.o. F) Treating RN: Montey Hora Primary Care Provider: Harrel Lemon Other Clinician: Referring Provider: Harrel Lemon Treating Provider/Extender: Melburn Hake, HOYT Weeks in Treatment: 2 Active Problems ICD-10 Evaluated Encounter Code Description Active Date Today Diagnosis I89.0 Lymphedema, not elsewhere classified 02/14/2019 No Yes S81.801A Unspecified open wound, right lower leg, initial encounter 02/14/2019 No Yes E11.622 Type 2 diabetes mellitus with other skin ulcer 02/14/2019 No Yes Inactive Problems Resolved Problems Electronic Signature(s) Signed: 02/28/2019 5:25:20 PM By: Worthy Keeler PA-C Entered By: Worthy Keeler on 02/28/2019 64:40:34 Kinn, Yulissa Jenetta [name] ([id number]) -------------------------------------------------------------------------------- Progress Note Details Patient Name: [name], [name]. Date of Service: 02/28/2019 1:45 PM Medical Record Number: [id number] Patient Account Number: [id number] Date of Birth/Sex: 1935-05-14 (84 y.o. F) Treating RN: Montey Hora Primary Care Provider: Harrel Lemon Other Clinician: Referring Provider: Harrel Lemon Treating Provider/Extender: Melburn Hake, HOYT Weeks in Treatment: 2 Subjective Chief Complaint Information obtained from Patient Right shin ulcer History of Present Illness (HPI) 84 year old patient who is known to have diabetes mellitus and tobacco  abuse has had a injury to the right posterior calf about a month ago. He has been treated with local care and 2 courses of antibiotics which include Keflex and doxycycline which she has completed. Smokes about a pack and a half of cigarettes a day and has a past medical history of COPD, diabetes mellitus, hyperlipidemia,psoriasis and hypothyroidism. Readmission: 04/04/18 patient presents today for a initial evaluation today concerning a right forearm skin tear which has been present for about two weeks. Currently need this point has been applied to the wound bed followed by a protective dressing that has been secured by wrapping instead of adhesive's. Currently the patient's wound does seem to be doing better patient and her husband states that the excess skin from  the skin tear was actually trimmed away prior to starting the wound care they have been performing. With that being said she seems to be doing well there's no evidence of infection which is good news and overall the wound seems to be showing signs of good granulation and overall improvement which is great news. Fortunately she has no with valid bladder dysfunction unexpected fevers chills or weight loss. This seems to be rather straightforward in my opinion in regard to treatment. 04/12/18 on evaluation today patient appears to be doing very well in regard to her right forearm skin tear. She's been tolerating the dressing changes without complication. In general this appears to show signs of great improvement since last time I saw her. Overall I'm very pleased with how things are going patient still somewhat scared about the fact that the skin that is healing over appears very fragile I explained that it does take time for this to really tough enough even once it closes over. Fortunately there is no evidence of infection. 04/19/18 on evaluation today patient appears to be doing very well in regard to her right forearm ulcer. This actually  appears to be completely healed at this point which is good news. There does not appear to be any evidence of infection currently. She still has a little bit of discomfort due to some underlying irritation but overall she is doing excellent. Readmission: 02/14/19 on evaluation today patient actually appears to be doing very well all things considering in regard to her right shin ulcer which occurred as a result of a picture frame dropping onto the leg last Thursday. This is not been quite a week as of today. Because we couldn't get him today this happened she actually did go to urgent care was she with this guy Keflex for week and also a pressure dressing was applied. this has caused some issues with fluid buildup below and above the dressing unfortunately. Fortunately there does not appear to be signs of infection at this time which is excellent news No fevers, chills, nausea, or vomiting noted at this time. 02/21/19 on evaluation today patient actually appears to be doing much better at this point in regard to her right lower Trinity ulcer. She's been tolerating the dressing changes without complication. Fortunately there's no evidence of infection at this time. No fevers, chills, nausea, or vomiting noted at this time. 02/28/19 on evaluation today patient actually appears to be doing very well in regard to her lower extremity ulcer. She has Avedisian, Perlita O. ([id number]) been tolerating the dressing changes without complication. Fortunately this seems to be doing significantly better which is great news. Overall I'm very pleased with the progress. I do believe that the patient is doing excellent overall from the standpoint of her wound although there was some dressing stuck to the surface of the wound that did require sharp debridement today. Patient History Information obtained from Patient. Family History Heart Disease - Mother, No family history of Cancer, Diabetes, Hereditary Spherocytosis,  Hypertension, Kidney Disease, Lung Disease, Seizures, Stroke, Thyroid Problems, Tuberculosis. Social History Current every day smoker - 1 1.5 a day, Marital Status - Married, Alcohol Use - Never, Drug Use - No History, Caffeine Use - Daily. Medical History Eyes Patient has history of Cataracts - removed bilaterally Denies history of Glaucoma, Optic Neuritis Ear/Nose/Mouth/Throat Denies history of Chronic sinus problems/congestion, Middle ear problems Hematologic/Lymphatic Patient has history of Anemia Denies history of Hemophilia, Human Immunodeficiency Virus, Lymphedema, Sickle Cell Disease Respiratory Patient has history  of Chronic Obstructive Pulmonary Disease (COPD) Denies history of Aspiration, Asthma, Pneumothorax, Sleep Apnea, Tuberculosis Cardiovascular Denies history of Angina, Arrhythmia, Congestive Heart Failure, Coronary Artery Disease, Deep Vein Thrombosis, Hypertension, Hypotension, Myocardial Infarction, Peripheral Arterial Disease, Peripheral Venous Disease, Phlebitis, Vasculitis Gastrointestinal Denies history of Cirrhosis , Colitis, Crohn s, Hepatitis A, Hepatitis B, Hepatitis C Endocrine Patient has history of Type II Diabetes Denies history of Type I Diabetes Immunological Denies history of Lupus Erythematosus, Raynaud s, Scleroderma Integumentary (Skin) Denies history of History of Burn, History of pressure wounds Musculoskeletal Denies history of Gout, Rheumatoid Arthritis, Osteoarthritis, Osteomyelitis Neurologic Denies history of Dementia, Neuropathy, Quadriplegia, Paraplegia, Seizure Disorder Oncologic Patient has history of Received Radiation - 2015 Denies history of Received Chemotherapy Medical And Surgical History Notes Cardiovascular hyperlipidemia Review of Systems (ROS) Constitutional Symptoms (General Health) Denies complaints or symptoms of Fever, Chills. Respiratory The patient has no complaints or symptoms. JANEYA, DEYO  ([id number]) Cardiovascular Complains or has symptoms of LE edema. Psychiatric The patient has no complaints or symptoms. Objective Constitutional Well-nourished and well-hydrated in no acute distress. Vitals Time Taken: 1:53 PM, Height: 64 in, Weight: 136.1 lbs, BMI: 23.4, Temperature: 98.1 F, Pulse: 90 bpm, Respiratory Rate: 16 breaths/min, Blood Pressure: 144/59 mmHg. Respiratory normal breathing without difficulty. Psychiatric this patient is able to make decisions and demonstrates good insight into disease process. Alert and Oriented x 3. pleasant and cooperative. General Notes: I was able to debride away the dried dressing material as well as the eschar from the surface of the wound and the patient actually appears to have a lot of good healing underneath this. Her swelling is doing very well today I do believe that Tubigrip was beneficial in that regard. Nonetheless I'm gonna suggest that we continue with the above wound care measures for the next week and the patient is in agreement with that plan. Integumentary (Hair, Skin) Wound #3 status is Open. Original cause of wound was Trauma. The wound is located on the Right,Lateral Lower Leg. The wound measures 0.5cm length x 0.8cm width x 0.1cm depth; 0.314cm^2 area and 0.031cm^3 volume. There is Fat Layer (Subcutaneous Tissue) Exposed exposed. There is no tunneling or undermining noted. There is a medium amount of serosanguineous drainage noted. The wound margin is flat and intact. There is medium (34-66%) red granulation within the wound bed. There is a medium (34-66%) amount of necrotic tissue within the wound bed including Eschar and Adherent Slough. The periwound skin appearance did not exhibit: Callus, Crepitus, Excoriation, Induration, Rash, Scarring, Dry/Scaly, Maceration, Atrophie Blanche, Cyanosis, Ecchymosis, Hemosiderin Staining, Mottled, Pallor, Rubor, Erythema. Periwound temperature was noted as No Abnormality. The  periwound has tenderness on palpation. Assessment Active Problems ICD-10 Lymphedema, not elsewhere classified Unspecified open wound, right lower leg, initial encounter Type 2 diabetes mellitus with other skin ulcer [name], [name] ([id number]) Procedures Wound #3 Pre-procedure diagnosis of Wound #3 is a Diabetic Wound/Ulcer of the Lower Extremity located on the Right,Lateral Lower Leg .Severity of Tissue Pre Debridement is: Fat layer exposed. There was a Selective/Open Wound Skin/Epidermis Debridement with a total area of 0.4 sq cm performed by STONE III, HOYT E., PA-C. With the following instrument(s): Curette to remove Non-Viable tissue/material. Material removed includes Skin: Dermis, Skin: Epidermis, and Other: adhered dressing after achieving pain control using Lidocaine 4% Topical Solution. No specimens were taken. A time out was conducted at 14:14, prior to the start of the procedure. There was no bleeding. The procedure was tolerated well with a pain level of 0  throughout and a pain level of 0 following the procedure. Post Debridement Measurements: 0.5cm length x 0.8cm width x 0.1cm depth; 0.031cm^3 volume. Character of Wound/Ulcer Post Debridement is improved. Severity of Tissue Post Debridement is: Fat layer exposed. Post procedure Diagnosis Wound #3: Same as Pre-Procedure Plan Wound Cleansing: Wound #3 Right,Lateral Lower Leg: Clean wound with Normal Saline. May Shower, gently pat wound dry prior to applying new dressing. Anesthetic (add to Medication List): Wound #3 Right,Lateral Lower Leg: Topical Lidocaine 4% cream applied to wound bed prior to debridement (In Clinic Only). Primary Wound Dressing: Wound #3 Right,Lateral Lower Leg: Xeroform Secondary Dressing: Wound #3 Right,Lateral Lower Leg: Conform/Kerlix Non-adherent pad Dressing Change Frequency: Wound #3 Right,Lateral Lower Leg: Change dressing every other day. Follow-up Appointments: Wound #3 Right,Lateral  Lower Leg: Return Appointment in 1 week. Edema Control: Wound #3 Right,Lateral Lower Leg: Other: - Tubigrip F We will continue with the above wound care measures for the next week and the patient is in agreement the plan. If anything changes worsens meantime shall contact the office let me know. Please see above for specific wound care orders. We will see patient for re-evaluation in 1 week(s) here in the clinic. If anything worsens or changes patient will contact our office for additional recommendations. [name], [name] ([id number]) Electronic Signature(s) Signed: 02/28/2019 5:25:20 PM By: Worthy Keeler PA-C Entered By: Worthy Keeler on 02/28/2019 70:26:37 [name], [name] ([id number]) -------------------------------------------------------------------------------- ROS/PFSH Details Patient Name: [name] Date of Service: 02/28/2019 1:45 PM Medical Record Number: [id number] Patient Account Number: [id number] Date of Birth/Sex: [birth year] (84 y.o. F) Treating RN: Montey Hora Primary Care Provider: Harrel Lemon Other Clinician: Referring Provider: Harrel Lemon Treating Provider/Extender: Melburn Hake, HOYT Weeks in Treatment: 2 Information Obtained From Patient Wound History Do you currently have one or more open woundso Yes How many open wounds do you currently haveo 1 Approximately how long have you had your woundso 1 How have you been treating your wound(s) until nowo presssure dressing Has your wound(s) ever healed and then re-openedo No Have you had any lab work done in the past montho No Have you tested positive for an antibiotic resistant organism (MRSA, VRE)o No Have you tested positive for osteomyelitis (bone infection)o No Have you had any tests for circulation on your legso No Constitutional Symptoms (General Health) Complaints and Symptoms: Negative for: Fever; Chills Cardiovascular Complaints and Symptoms: Positive for: LE edema Medical  History: Negative for: Angina; Arrhythmia; Congestive Heart Failure; Coronary Artery Disease; Deep Vein Thrombosis; Hypertension; Hypotension; Myocardial Infarction; Peripheral Arterial Disease; Peripheral Venous Disease; Phlebitis; Vasculitis Past Medical History Notes: hyperlipidemia Eyes Medical History: Positive for: Cataracts - removed bilaterally Negative for: Glaucoma; Optic Neuritis Ear/Nose/Mouth/Throat Medical History: Negative for: Chronic sinus problems/congestion; Middle ear problems Hematologic/Lymphatic Medical History: Positive for: Anemia Negative for: Hemophilia; Human Immunodeficiency Virus; Lymphedema; Sickle Cell Disease Respiratory Lyvers, Bryce O. ([id number]) Complaints and Symptoms: No Complaints or Symptoms Medical History: Positive for: Chronic Obstructive Pulmonary Disease (COPD) Negative for: Aspiration; Asthma; Pneumothorax; Sleep Apnea; Tuberculosis Gastrointestinal Medical History: Negative for: Cirrhosis ; Colitis; Crohnos; Hepatitis A; Hepatitis B; Hepatitis C Endocrine Medical History: Positive for: Type II Diabetes Negative for: Type I Diabetes Time with diabetes: 15 years Treated with: Oral agents Blood sugar tested every day: No Immunological Medical History: Negative for: Lupus Erythematosus; Raynaudos; Scleroderma Integumentary (Skin) Medical History: Negative for: History of Burn; History of pressure wounds Musculoskeletal Medical History: Negative for: Gout; Rheumatoid Arthritis; Osteoarthritis; Osteomyelitis Neurologic Medical History: Negative for: Dementia; Neuropathy;  Quadriplegia; Paraplegia; Seizure Disorder Oncologic Medical History: Positive for: Received Radiation - 2015 Negative for: Received Chemotherapy Psychiatric Complaints and Symptoms: No Complaints or Symptoms HBO Extended History Items Eyes: Cataracts Immunizations Pneumococcal Vaccine: MARRISSA, [name] ([id number]) Received Pneumococcal Vaccination:  Yes Implantable Devices None Family and Social History Cancer: No; Diabetes: No; Heart Disease: Yes - Mother; Hereditary Spherocytosis: No; Hypertension: No; Kidney Disease: No; Lung Disease: No; Seizures: No; Stroke: No; Thyroid Problems: No; Tuberculosis: No; Current every day smoker - 1 1.5 a day; Marital Status - Married; Alcohol Use: Never; Drug Use: No History; Caffeine Use: Daily; Financial Concerns: No; Food, Clothing or Shelter Needs: No; Support System Lacking: No; Transportation Concerns: No; Advanced Directives: No; Patient does not want information on Advanced Directives; Do not resuscitate: No; Living Will: Yes (Not Provided); Medical Power of Attorney: No Physician Affirmation I have reviewed and agree with the above information. Electronic Signature(s) Signed: 02/28/2019 4:44:55 PM By: Montey Hora Signed: 02/28/2019 5:25:20 PM By: Worthy Keeler PA-C Entered By: Worthy Keeler on 02/28/2019 92:42:68 Siddiqi, [name] ([id number]) -------------------------------------------------------------------------------- SuperBill Details Patient Name: [name] Date of Service: 02/28/2019 Medical Record Number: [id number] Patient Account Number: [id number] Date of Birth/Sex: 1935-10-15 (84 y.o. F) Treating RN: Montey Hora Primary Care Provider: Harrel Lemon Other Clinician: Referring Provider: Harrel Lemon Treating Provider/Extender: Melburn Hake, HOYT Weeks in Treatment: 2 Diagnosis Coding ICD-10 Codes Code Description I89.0 Lymphedema, not elsewhere classified S81.801A Unspecified open wound, right lower leg, initial encounter E11.622 Type 2 diabetes mellitus with other skin ulcer Facility Procedures CPT4 Code: 17408144 Description: [phone] - [facility]BR[facility] WOUND 1ST 20 SQ CM OR < ICD-10 Diagnosis Description S81.801A Unspecified open wound, right lower leg, initial encounter Modifier: Quantity: 1 Physician Procedures CPT4 Code: 3149702 Description: 63785 - WC PHYS  DEBR WO ANESTH 20 SQ CM ICD-10 Diagnosis Description S81.801A Unspecified open wound, right lower leg, initial encounter Modifier: Quantity: 1 Electronic Signature(s) Signed: 02/28/2019 5:25:20 PM By: Worthy Keeler PA-C Entered By: Worthy Keeler on 02/28/2019 14:27:56

## 2019-03-07 ENCOUNTER — Ambulatory Visit: Payer: Medicare Other | Admitting: Physician Assistant

## 2019-06-23 DIAGNOSIS — Z720 Tobacco use: Secondary | ICD-10-CM | POA: Insufficient documentation

## 2019-11-15 DIAGNOSIS — L309 Dermatitis, unspecified: Secondary | ICD-10-CM

## 2019-11-15 HISTORY — DX: Dermatitis, unspecified: L30.9

## 2020-02-17 DIAGNOSIS — L409 Psoriasis, unspecified: Secondary | ICD-10-CM

## 2020-03-06 ENCOUNTER — Ambulatory Visit: Payer: Medicare Other | Admitting: Dermatology

## 2020-06-24 ENCOUNTER — Ambulatory Visit: Payer: Medicare Other | Admitting: Dermatology

## 2020-06-24 ENCOUNTER — Other Ambulatory Visit: Payer: Self-pay

## 2020-06-24 DIAGNOSIS — D692 Other nonthrombocytopenic purpura: Secondary | ICD-10-CM | POA: Diagnosis not present

## 2020-06-24 DIAGNOSIS — L409 Psoriasis, unspecified: Secondary | ICD-10-CM | POA: Diagnosis not present

## 2020-06-24 DIAGNOSIS — D485 Neoplasm of uncertain behavior of skin: Secondary | ICD-10-CM

## 2020-06-24 DIAGNOSIS — D492 Neoplasm of unspecified behavior of bone, soft tissue, and skin: Secondary | ICD-10-CM

## 2020-06-24 NOTE — Progress Notes (Signed)
   Follow-Up Visit   Subjective  [name] is a 85 y.o. female who presents for the following: Psoriasis (6 months f/u psoriasis treating with [place] Nail with a good response ). Pt had a allergic reaction to Atorvastatin in the past, but this rash has resolved.  The following portions of the chart were reviewed this encounter and updated as appropriate:  Tobacco  Allergies  Meds  Problems  Med Hx  Surg Hx  Fam Hx      Review of Systems:  No other skin or systemic complaints except as noted in HPI or Assessment and Plan.  Objective  Well appearing patient in no apparent distress; mood and affect are within normal limits.  A focused examination was performed including L foot, arms, legs . Relevant physical exam findings are noted in the Assessment and Plan.  Objective  L foot: Pinkness and peeling L foot   Objective  arms, legs: Violaceous macules and patches.   Objective  Left distal pretibial: 1.5 cm red patch   Assessment & Plan    Psoriasis - severe - mostly of palms and soles - better controlled on oral Otezla Feet/soles  Psoriasis controlled taking Otezla   No depression  No headaches  No diarrhea Cont Otezla 30 mg take 2 tablet daily   Senile purpura (HCC) arms, legs  Observe   Neoplasm of skin Left distal pretibial  Skin / nail biopsy Type of biopsy: tangential   Informed consent: discussed and consent obtained   Patient was prepped and draped in usual sterile fashion: area prepped with alochol. Anesthesia: the lesion was anesthetized in a standard fashion   Anesthetic:  1% lidocaine w/ epinephrine 1-100,000 buffered w/ 8.4% NaHCO3 Instrument used: flexible razor blade   Hemostasis achieved with: pressure, aluminum chloride and electrodesiccation   Outcome: patient tolerated procedure well   Post-procedure details: wound care instructions given   Post-procedure details comment:  Ointment and small bandage  Specimen 1 - Surgical  pathology Differential Diagnosis: R/O SCC Check Margins: No 1.5 cm red patch  Return in about 6 months (around 12/25/2020). IMarye Round, CMA, am acting as scribe for Sarina Ser, MD .  Documentation: I have reviewed the above documentation for accuracy and completeness, and I agree with the above.  Sarina Ser, MD

## 2020-06-24 NOTE — Patient Instructions (Signed)

## 2020-06-25 ENCOUNTER — Encounter: Payer: Self-pay | Admitting: Dermatology

## 2020-06-26 ENCOUNTER — Telehealth: Payer: Self-pay

## 2020-06-26 NOTE — Telephone Encounter (Signed)
Patient informed of pathology results 

## 2020-06-26 NOTE — Telephone Encounter (Signed)
-----   Message from Ralene Bathe, MD sent at 06/25/2020  6:59 PM EDT ----- Skin , left distal pretibial SUPERFICIAL ACTINIC POROKERATOSIS  Benign porokeratosis = rough growth associated with sun exposure. No treatment necessary, but can discuss new topical treatment or surgical removal if desired at next visit.

## 2020-07-04 ENCOUNTER — Telehealth: Payer: Self-pay

## 2020-07-04 NOTE — Telephone Encounter (Signed)
Pt called, she had a biopsy taken from her leg a few weeks ago now area is red and itching, no infection, actually area look better today than it did yesterday, what should she be doing,   discussed with pt areas on the legs are slow to heal, she should, clean area daily with mild soap and water and apply plain Vaseline daily

## 2020-09-13 ENCOUNTER — Encounter: Payer: Medicare Other | Attending: Physician Assistant | Admitting: Physician Assistant

## 2020-09-13 ENCOUNTER — Other Ambulatory Visit: Payer: Self-pay

## 2020-09-13 DIAGNOSIS — F17210 Nicotine dependence, cigarettes, uncomplicated: Secondary | ICD-10-CM | POA: Insufficient documentation

## 2020-09-13 DIAGNOSIS — J449 Chronic obstructive pulmonary disease, unspecified: Secondary | ICD-10-CM | POA: Diagnosis not present

## 2020-09-13 DIAGNOSIS — S81801A Unspecified open wound, right lower leg, initial encounter: Secondary | ICD-10-CM | POA: Insufficient documentation

## 2020-09-13 DIAGNOSIS — I890 Lymphedema, not elsewhere classified: Secondary | ICD-10-CM | POA: Insufficient documentation

## 2020-09-13 DIAGNOSIS — X58XXXA Exposure to other specified factors, initial encounter: Secondary | ICD-10-CM | POA: Diagnosis not present

## 2020-09-13 DIAGNOSIS — E11621 Type 2 diabetes mellitus with foot ulcer: Secondary | ICD-10-CM | POA: Insufficient documentation

## 2020-09-18 ENCOUNTER — Other Ambulatory Visit: Payer: Self-pay

## 2020-09-18 ENCOUNTER — Encounter: Payer: Medicare Other | Admitting: Internal Medicine

## 2020-09-18 DIAGNOSIS — I890 Lymphedema, not elsewhere classified: Secondary | ICD-10-CM | POA: Diagnosis not present

## 2020-09-18 NOTE — Progress Notes (Signed)
Deborah, [name] ([id number]) Visit Report for 09/18/2020 HPI Details Patient Name: [name], [name]. Date of Service: 09/18/2020 1:30 PM Medical Record Number: [id number] Patient Account Number: [id number] Date of Birth/Sex: 12-03-1935 (85 y.o. F) Treating RN: Army Melia Primary Care Provider: Harrel Lemon Other Clinician: Referring Provider: Harrel Lemon Treating Provider/Extender: Tito Dine in Treatment: 0 History of Present Illness HPI Description: 85 year old patient who is known to have diabetes mellitus and tobacco abuse has had a injury to the right posterior calf about a month ago. He has been treated with local care and 2 courses of antibiotics which include Keflex and doxycycline which she has completed. Smokes about a pack and a half of cigarettes a day and has a past medical history of COPD, diabetes mellitus, hyperlipidemia,psoriasis and hypothyroidism. Readmission: 04/04/18 patient presents today for a initial evaluation today concerning a right forearm skin tear which has been present for about two weeks. Currently need this point has been applied to the wound bed followed by a protective dressing that has been secured by wrapping instead of adhesive's. Currently the patient's wound does seem to be doing better patient and her husband states that the excess skin from the skin tear was actually trimmed away prior to starting the wound care they have been performing. With that being said she seems to be doing well there's no evidence of infection which is good news and overall the wound seems to be showing signs of good granulation and overall improvement which is great news. Fortunately she has no with valid bladder dysfunction unexpected fevers chills or weight loss. This seems to be rather straightforward in my opinion in regard to treatment. 83/30/19 on evaluation today patient appears to be doing very well in regard to her right forearm skin tear. She's been  tolerating the dressing changes without complication. In general this appears to show signs of great improvement since last time I saw her. Overall I'm very pleased with how things are going patient still somewhat scared about the fact that the skin that is healing over appears very fragile I explained that it does take time for this to really tough enough even once it closes over. Fortunately there is no evidence of infection. 04/19/18 on evaluation today patient appears to be doing very well in regard to her right forearm ulcer. This actually appears to be completely healed at this point which is good news. There does not appear to be any evidence of infection currently. She still has a little bit of discomfort due to some underlying irritation but overall she is doing excellent. Readmission: 83/3/20 on evaluation today patient actually appears to be doing very well all things considering in regard to her right shin ulcer which occurred as a result of a picture frame dropping onto the leg last Thursday. This is not been quite a week as of today. Because we couldn't get him today this happened she actually did go to urgent care was she with this guy Keflex for week and also a pressure dressing was applied. this has caused some issues with fluid buildup below and above the dressing unfortunately. Fortunately there does not appear to be signs of infection at this time which is excellent news No fevers, chills, nausea, or vomiting noted at this time. 83/10/20 on evaluation today patient actually appears to be doing much better at this point in regard to her right lower Trinity ulcer. She's been tolerating the dressing changes without complication. Fortunately there's no evidence of infection  at this time. No fevers, chills, nausea, or vomiting noted at this time. 83/17/20 on evaluation today patient actually appears to be doing very well in regard to her lower extremity ulcer. She has been tolerating  the dressing changes without complication. Fortunately this seems to be doing significantly better which is great news. Overall I'm very pleased with the progress. I do believe that the patient is doing excellent overall from the standpoint of her wound although there was some dressing stuck to the surface of the wound that did require sharp debridement today. Readmission: 83/12/2019 upon evaluation today patient appears to be doing well all things considered in regard to her right leg skin tear. She has been putting some Vaseline on this nothing else at this point. Fortunately there is no signs of active infection which is great news this happened she tells me about 4 days ago. She decided to come in and have me take a look at it to ensure nothing else was going on. She does not even remember how exactly she did this other than she had some burning/stinging on her leg and she went to rub it causing this to open. 10/6; follow-up with regards to her right lower extremity anterior skin tear. We put Steri-Strips on this last week. The skin around this especially medially is still viable which is good news. The wound is not totally opposed Electronic Signature(s) Signed: 09/18/2020 4:14:27 PM By: Linton Ham MD Entered By: Linton Ham on 09/18/2020 93:79:02 [name], [name] ([id number]) -------------------------------------------------------------------------------- Physical Exam Details Patient Name: [name], [name]. Date of Service: 09/18/2020 1:30 PM Medical Record Number: [id number] Patient Account Number: [id number] Date of Birth/Sex: 1935-02-17 (85 y.o. F) Treating RN: Army Melia Primary Care Provider: Harrel Lemon Other Clinician: Referring Provider: Harrel Lemon Treating Provider/Extender: Tito Dine in Treatment: 0 Constitutional Patient is hypertensive.. Pulse regular and within target range for patient.Marland Kitchen Respirations regular, non-labored and within target  range.. Temperature is normal and within the target range for the patient.Marland Kitchen appears in no distress. Cardiovascular Pedal pulses are palpable. Edema is well controlled. Notes Wound exam; skin tear on the anterior anterior part of her right upper leg. The separated skin that was put into place last time carefully is still viable. We reapplied the Steri-Strips as I still think there is risk of further separation here. There is no evidence of infection Electronic Signature(s) Signed: 09/18/2020 4:14:27 PM By: Linton Ham MD Entered By: Linton Ham on 09/18/2020 96:22:29 [name], [name] ([id number]) -------------------------------------------------------------------------------- Physician Orders Details Patient Name: [name] Date of Service: 09/18/2020 1:30 PM Medical Record Number: [id number] Patient Account Number: [id number] Date of Birth/Sex: 06/03/1935 (85 y.o. F) Treating RN: Army Melia Primary Care Provider: Harrel Lemon Other Clinician: Referring Provider: Harrel Lemon Treating Provider/Extender: Tito Dine in Treatment: 0 Verbal / Phone Orders: No Diagnosis Coding Primary Wound Dressing Wound #4 Right,Distal,Anterior Lower Leg o Xeroform o Other: - steri strips Secondary Dressing Wound #4 Right,Distal,Anterior Lower Leg o ABD and Kerlix/Conform o Other - secure with stretchnet Dressing Change Frequency Wound #4 Right,Distal,Anterior Lower Leg o Change dressing every week Follow-up Appointments Wound #4 Right,Distal,Anterior Lower Leg o Return Appointment in 1 week. - nurse visit next week o Nurse Visit as needed Electronic Signature(s) Signed: 09/18/2020 2:10:04 PM By: Army Melia Signed: 09/18/2020 4:14:27 PM By: Linton Ham MD Entered By: Army Melia on 09/18/2020 18:56:31 [place], [name] ([id number]) -------------------------------------------------------------------------------- Problem List Details Patient Name:  BRIELYN, BOSAK. Date of Service: 09/18/2020 1:30  PM Medical Record Number: [id number] Patient Account Number: [id number] Date of Birth/Sex: 07-Jul-1935 (85 y.o. F) Treating RN: Army Melia Primary Care Provider: Harrel Lemon Other Clinician: Referring Provider: Harrel Lemon Treating Provider/Extender: Tito Dine in Treatment: 0 Active Problems ICD-10 Encounter Code Description Active Date MDM Diagnosis I89.0 Lymphedema, not elsewhere classified 09/13/2020 No Yes S81.801A Unspecified open wound, right lower leg, initial encounter 09/13/2020 No Yes Inactive Problems Resolved Problems Electronic Signature(s) Signed: 09/18/2020 4:14:27 PM By: Linton Ham MD Entered By: Linton Ham on 09/18/2020 19:37:90 [name], [name] ([id number]) -------------------------------------------------------------------------------- Progress Note Details Patient Name: [name], [name]. Date of Service: 09/18/2020 1:30 PM Medical Record Number: [id number] Patient Account Number: [id number] Date of Birth/Sex: 01/05/1935 (85 y.o. F) Treating RN: Army Melia Primary Care Provider: Harrel Lemon Other Clinician: Referring Provider: Harrel Lemon Treating Provider/Extender: Tito Dine in Treatment: 0 Subjective History of Present Illness (HPI) 85 year old patient who is known to have diabetes mellitus and tobacco abuse has had a injury to the right posterior calf about a month ago. He has been treated with local care and 2 courses of antibiotics which include Keflex and doxycycline which she has completed. Smokes about a pack and a half of cigarettes a day and has a past medical history of COPD, diabetes mellitus, hyperlipidemia,psoriasis and hypothyroidism. Readmission: 04/04/18 patient presents today for a initial evaluation today concerning a right forearm skin tear which has been present for about two weeks. Currently need this point has been applied to the wound bed  followed by a protective dressing that has been secured by wrapping instead of adhesive's. Currently the patient's wound does seem to be doing better patient and her husband states that the excess skin from the skin tear was actually trimmed away prior to starting the wound care they have been performing. With that being said she seems to be doing well there's no evidence of infection which is good news and overall the wound seems to be showing signs of good granulation and overall improvement which is great news. Fortunately she has no with valid bladder dysfunction unexpected fevers chills or weight loss. This seems to be rather straightforward in my opinion in regard to treatment. 83/30/19 on evaluation today patient appears to be doing very well in regard to her right forearm skin tear. She's been tolerating the dressing changes without complication. In general this appears to show signs of great improvement since last time I saw her. Overall I'm very pleased with how things are going patient still somewhat scared about the fact that the skin that is healing over appears very fragile I explained that it does take time for this to really tough enough even once it closes over. Fortunately there is no evidence of infection. 04/19/18 on evaluation today patient appears to be doing very well in regard to her right forearm ulcer. This actually appears to be completely healed at this point which is good news. There does not appear to be any evidence of infection currently. She still has a little bit of discomfort due to some underlying irritation but overall she is doing excellent. Readmission: 83/3/20 on evaluation today patient actually appears to be doing very well all things considering in regard to her right shin ulcer which occurred as a result of a picture frame dropping onto the leg last Thursday. This is not been quite a week as of today. Because we couldn't get him today this happened she actually  did go to urgent care was she with  this guy Keflex for week and also a pressure dressing was applied. this has caused some issues with fluid buildup below and above the dressing unfortunately. Fortunately there does not appear to be signs of infection at this time which is excellent news No fevers, chills, nausea, or vomiting noted at this time. 83/10/20 on evaluation today patient actually appears to be doing much better at this point in regard to her right lower Trinity ulcer. She's been tolerating the dressing changes without complication. Fortunately there's no evidence of infection at this time. No fevers, chills, nausea, or vomiting noted at this time. 83/17/20 on evaluation today patient actually appears to be doing very well in regard to her lower extremity ulcer. She has been tolerating the dressing changes without complication. Fortunately this seems to be doing significantly better which is great news. Overall I'm very pleased with the progress. I do believe that the patient is doing excellent overall from the standpoint of her wound although there was some dressing stuck to the surface of the wound that did require sharp debridement today. Readmission: 83/12/2019 upon evaluation today patient appears to be doing well all things considered in regard to her right leg skin tear. She has been putting some Vaseline on this nothing else at this point. Fortunately there is no signs of active infection which is great news this happened she tells me about 4 days ago. She decided to come in and have me take a look at it to ensure nothing else was going on. She does not even remember how exactly she did this other than she had some burning/stinging on her leg and she went to rub it causing this to open. 10/6; follow-up with regards to her right lower extremity anterior skin tear. We put Steri-Strips on this last week. The skin around this especially medially is still viable which is good news. The  wound is not totally opposed Objective Constitutional Patient is hypertensive.. Pulse regular and within target range for patient.Marland Kitchen Respirations regular, non-labored and within target range.. Temperature is normal and within the target range for the patient.Marland Kitchen appears in no distress. Deborah, [name] ([id number]) Vitals Time Taken: 1:28 PM, Height: 64 in, Weight: 135 lbs, BMI: 23.2, Temperature: 98.0 F, Pulse: 112 bpm, Respiratory Rate: 16 breaths/min, Blood Pressure: 164/77 mmHg. Cardiovascular Pedal pulses are palpable. Edema is well controlled. General Notes: Wound exam; skin tear on the anterior anterior part of her right upper leg. The separated skin that was put into place last time carefully is still viable. We reapplied the Steri-Strips as I still think there is risk of further separation here. There is no evidence of infection Integumentary (Hair, Skin) Wound #4 status is Open. Original cause of wound was Gradually Appeared. The wound is located on the Right,Distal,Anterior Lower Leg. The wound measures 2.7cm length x 0.2cm width x 0.1cm depth; 0.424cm^2 area and 0.042cm^3 volume. There is Fat Layer (Subcutaneous Tissue) exposed. There is a small amount of serous drainage noted. There is medium (34-66%) granulation within the wound bed. There is a small (1-33%) amount of necrotic tissue within the wound bed including Adherent Slough. Assessment Active Problems ICD-10 Lymphedema, not elsewhere classified Unspecified open wound, right lower leg, initial encounter Plan Primary Wound Dressing: Wound #4 Right,Distal,Anterior Lower Leg: Xeroform Other: - steri strips Secondary Dressing: Wound #4 Right,Distal,Anterior Lower Leg: ABD and Kerlix/Conform Other - secure with stretchnet Dressing Change Frequency: Wound #4 Right,Distal,Anterior Lower Leg: Change dressing every week Follow-up Appointments: Wound #4 Right,Distal,Anterior Lower Leg: Return  Appointment in 1 week. - nurse  visit next week Nurse Visit as needed #1 we Steri-Stripped the area reapplied Xeroform ABD kerlix conform. She does not appear to have anybody to help change this and she left this on all week however truthfully everything looks to be viable here. 2. We will review next week Electronic Signature(s) Signed: 09/18/2020 4:14:27 PM By: Linton Ham MD Entered By: Linton Ham on 09/18/2020 23:30:07 [name], [name] ([id number]) -------------------------------------------------------------------------------- SuperBill Details Patient Name: [name] Date of Service: 09/18/2020 Medical Record Number: [id number] Patient Account Number: [id number] Date of Birth/Sex: 1935-04-11 (85 y.o. F) Treating RN: Army Melia Primary Care Provider: Harrel Lemon Other Clinician: Referring Provider: Harrel Lemon Treating Provider/Extender: Tito Dine in Treatment: 0 Diagnosis Coding ICD-10 Codes Code Description I89.0 Lymphedema, not elsewhere classified S81.801A Unspecified open wound, right lower leg, initial encounter Facility Procedures CPT4 Code: 73428768 Description: [place] [facility] VISIT-LEV 3 EST PT Modifier: Quantity: 1 Physician Procedures CPT4 Code: 1157262 Description: 03559 - WC PHYS LEVEL 3 - EST PT Modifier: Quantity: 1 CPT4 Code: Description: ICD-10 Diagnosis Description I89.0 Lymphedema, not elsewhere classified S81.801A Unspecified open wound, right lower leg, initial encounter Modifier: Quantity: Electronic Signature(s) Signed: 09/18/2020 4:14:27 PM By: Linton Ham MD Entered By: Linton Ham on 09/18/2020 14:33:23

## 2020-09-25 ENCOUNTER — Other Ambulatory Visit: Payer: Self-pay

## 2020-09-25 ENCOUNTER — Encounter: Payer: Medicare Other | Admitting: Internal Medicine

## 2020-09-25 DIAGNOSIS — I890 Lymphedema, not elsewhere classified: Secondary | ICD-10-CM | POA: Diagnosis not present

## 2020-09-30 ENCOUNTER — Other Ambulatory Visit: Payer: Self-pay

## 2020-09-30 ENCOUNTER — Encounter: Payer: Medicare Other | Admitting: Physician Assistant

## 2020-09-30 DIAGNOSIS — I890 Lymphedema, not elsewhere classified: Secondary | ICD-10-CM | POA: Diagnosis not present

## 2020-09-30 NOTE — Progress Notes (Signed)
Deborah, [name] ([id number]) Visit Report for 09/25/2020 Arrival Information Details Patient Name: [name], [name]. Date of Service: 09/25/2020 12:45 PM Medical Record Number: [id number] Patient Account Number: [id number] Date of Birth/Sex: [birth year] (85 y.o. F) Treating RN: Army Melia Primary Care Lavarr President: Harrel Lemon Other Clinician: Referring Auden Wettstein: Harrel Lemon Treating Stancil Deisher/Extender: Tito Dine in Treatment: 1 Visit Information History Since Last Visit Added or deleted any medications: No Patient Arrived: Ambulatory Any new allergies or adverse reactions: No Arrival Time: 12:44 Had a fall or experienced change in No Accompanied By: self activities of daily living that may affect Transfer Assistance: None risk of falls: Patient Identification Verified: Yes Signs or symptoms of abuse/neglect since last visito No Hospitalized since last visit: No Has Dressing in Place as Prescribed: Yes Pain Present Now: No Electronic Signature(s) Signed: 09/25/2020 2:40:35 PM By: Army Melia Entered By: Army Melia on 09/25/2020 78:29:56 Raffo, Hoda Jenetta Downer ([id number]) -------------------------------------------------------------------------------- Clinic Level of Care Assessment Details Patient Name: [name]. Date of Service: 09/25/2020 12:45 PM Medical Record Number: [id number] Patient Account Number: [id number] Date of Birth/Sex: 1935-09-14 (85 y.o. F) Treating RN: Cornell Barman Primary Care Jaszmine Navejas: Harrel Lemon Other Clinician: Referring Kiefer Opheim: Harrel Lemon Treating Durant Scibilia/Extender: Tito Dine in Treatment: 1 Clinic Level of Care Assessment Items TOOL 4 Quantity Score []  - Use when only an EandM is performed on FOLLOW-UP visit 0 ASSESSMENTS - Nursing Assessment / Reassessment X - Reassessment of Co-morbidities (includes updates in patient status) 1 10 X- 1 5 Reassessment of Adherence to Treatment Plan ASSESSMENTS - Wound and  Skin Assessment / Reassessment X - Simple Wound Assessment / Reassessment - one wound 1 5 []  - 0 Complex Wound Assessment / Reassessment - multiple wounds []  - 0 Dermatologic / Skin Assessment (not related to wound area) ASSESSMENTS - Focused Assessment []  - Circumferential Edema Measurements - multi extremities 0 []  - 0 Nutritional Assessment / Counseling / Intervention []  - 0 Lower Extremity Assessment (monofilament, tuning fork, pulses) []  - 0 Peripheral Arterial Disease Assessment (using hand held doppler) ASSESSMENTS - Ostomy and/or Continence Assessment and Care []  - Incontinence Assessment and Management 0 []  - 0 Ostomy Care Assessment and Management (repouching, etc.) PROCESS - Coordination of Care X - Simple Patient / Family Education for ongoing care 1 15 []  - 0 Complex (extensive) Patient / Family Education for ongoing care []  - 0 Staff obtains Programmer, systems, Records, Test Results / Process Orders []  - 0 Staff telephones HHA, Nursing Homes / Clarify orders / etc []  - 0 Routine Transfer to another Facility (non-emergent condition) []  - 0 Routine Hospital Admission (non-emergent condition) []  - 0 New Admissions / Biomedical engineer / Ordering NPWT, Apligraf, etc. []  - 0 Emergency Hospital Admission (emergent condition) X- 1 10 Simple Discharge Coordination []  - 0 Complex (extensive) Discharge Coordination PROCESS - Special Needs []  - Pediatric / Minor Patient Management 0 []  - 0 Isolation Patient Management []  - 0 Hearing / Language / Visual special needs []  - 0 Assessment of Community assistance (transportation, D/C planning, etc.) []  - 0 Additional assistance / Altered mentation []  - 0 Support Surface(s) Assessment (bed, cushion, seat, etc.) INTERVENTIONS - Wound Cleansing / Measurement Harkin, Janyra O. ([id number]) X- 1 5 Simple Wound Cleansing - one wound []  - 0 Complex Wound Cleansing - multiple wounds X- 1 5 Wound Imaging (photographs - any  number of wounds) []  - 0 Wound Tracing (instead of photographs) X- 1 5 Simple Wound Measurement - one wound []  -  0 Complex Wound Measurement - multiple wounds INTERVENTIONS - Wound Dressings []  - Small Wound Dressing one or multiple wounds 0 X- 1 15 Medium Wound Dressing one or multiple wounds []  - 0 Large Wound Dressing one or multiple wounds []  - 0 Application of Medications - topical []  - 0 Application of Medications - injection INTERVENTIONS - Miscellaneous []  - External ear exam 0 []  - 0 Specimen Collection (cultures, biopsies, blood, body fluids, etc.) []  - 0 Specimen(s) / Culture(s) sent or taken to Lab for analysis []  - 0 Patient Transfer (multiple staff / Civil Service fast streamer / Similar devices) []  - 0 Simple Staple / Suture removal (25 or less) []  - 0 Complex Staple / Suture removal (26 or more) []  - 0 Hypo / Hyperglycemic Management (close monitor of Blood Glucose) []  - 0 Ankle / Brachial Index (ABI) - do not check if billed separately X- 1 5 Vital Signs Has the patient been seen at the hospital within the last three years: Yes Total Score: 80 Level Of Care: New/Established - Level 3 Electronic Signature(s) Signed: 09/30/2020 10:00:34 AM By: Gretta Cool, BSN, RN, CWS, Kim RN, BSN Entered By: Gretta Cool, BSN, RN, CWS, Kim on 09/25/2020 08:65:78 Neumeyer, Maurine [name] ([id number]) -------------------------------------------------------------------------------- Encounter Discharge Information Details Patient Name: [name]. Date of Service: 09/25/2020 12:45 PM Medical Record Number: [id number] Patient Account Number: [id number] Date of Birth/Sex: 04-08-1935 (85 y.o. F) Treating RN: Cornell Barman Primary Care Jeanpierre Thebeau: Harrel Lemon Other Clinician: Referring Zakhai Meisinger: Harrel Lemon Treating Feven Alderfer/Extender: Tito Dine in Treatment: 1 Encounter Discharge Information Items Discharge Condition: Stable Ambulatory Status: Ambulatory Discharge Destination:  Home Transportation: Private Auto Accompanied By: self Schedule Follow-up Appointment: Yes Clinical Summary of Care: Electronic Signature(s) Signed: 09/30/2020 10:00:34 AM By: Gretta Cool, BSN, RN, CWS, Kim RN, BSN Entered By: Gretta Cool, BSN, RN, CWS, Kim on 09/25/2020 27:25:36 [name] ([id number]) -------------------------------------------------------------------------------- Lower Extremity Assessment Details Patient Name: MAGDALENA, SKILTON. Date of Service: 09/25/2020 12:45 PM Medical Record Number: [id number] Patient Account Number: [id number] Date of Birth/Sex: December 04, 1935 (85 y.o. F) Treating RN: Army Melia Primary Care Samah Lapiana: Harrel Lemon Other Clinician: Referring Seini Lannom: Harrel Lemon Treating Asya Derryberry/Extender: Ricard Dillon Weeks in Treatment: 1 Edema Assessment Assessed: [Left: No] [Right: No] Edema: [Left: N] [Right: o] Vascular Assessment Pulses: Dorsalis Pedis Palpable: [Right:Yes] Electronic Signature(s) Signed: 09/25/2020 2:40:35 PM By: Army Melia Entered By: Army Melia on 09/25/2020 43:32:95 Jasmer, Hyacinth Jenetta Downer ([id number]) -------------------------------------------------------------------------------- Multi Wound Chart Details Patient Name: Brutus, Vernica O. Date of Service: 09/25/2020 12:45 PM Medical Record Number: [id number] Patient Account Number: [id number] Date of Birth/Sex: 1935-09-11 (85 y.o. F) Treating RN: Cornell Barman Primary Care Lavonne Cass: Harrel Lemon Other Clinician: Referring Jalena Vanderlinden: Harrel Lemon Treating Tequia Wolman/Extender: Tito Dine in Treatment: 1 Vital Signs Height(in): 15 Pulse(bpm): 50 Weight(lbs): 135 Blood Pressure(mmHg): 159/73 Body Mass Index(BMI): 23 Temperature(F): 97.9 Respiratory Rate(breaths/min): 16 Photos: [N/A:N/A] Wound Location: Right, Distal, Anterior Lower Leg N/A N/A Wounding Event: Gradually Appeared N/A N/A Primary Etiology: Skin Tear N/A N/A Comorbid History: Cataracts, Anemia,  Chronic N/A N/A Obstructive Pulmonary Disease (COPD), Type II Diabetes, Received Radiation Date Acquired: 09/09/2020 N/A N/A Weeks of Treatment: 1 N/A N/A Wound Status: Open N/A N/A Measurements L x W x D (cm) 1.8x1.1x0.1 N/A N/A Area (cm) : 1.555 N/A N/A Volume (cm) : 0.156 N/A N/A % Reduction in Area: -52.30% N/A N/A % Reduction in Volume: -52.90% N/A N/A Classification: Full Thickness Without Exposed N/A N/A Support Structures Exudate Amount: Small N/A N/A Exudate Type: Serous N/A N/A  Exudate Color: amber N/A N/A Granulation Amount: Medium (34-66%) N/A N/A Necrotic Amount: Small (1-33%) N/A N/A Exposed Structures: Fat Layer (Subcutaneous Tissue): N/A N/A Yes Fascia: No Tendon: No Muscle: No Joint: No Bone: No Epithelialization: Small (1-33%) N/A N/A Treatment Notes Wound #4 (Right, Distal, Anterior Lower Leg) Notes xeroform, abd, conform, stretchnet, Electronic Signature(s) LUTIE, PICKLER ([id number]) Signed: 09/25/2020 4:31:41 PM By: Linton Ham MD Entered By: Linton Ham on 09/25/2020 20:25:42 Mcquilkin, Maurine [name] ([id number]) -------------------------------------------------------------------------------- Multi-Disciplinary Care Plan Details Patient Name: [name]. Date of Service: 09/25/2020 12:45 PM Medical Record Number: [id number] Patient Account Number: [id number] Date of Birth/Sex: December 05, 1935 (85 y.o. F) Treating RN: Cornell Barman Primary Care Janda Cargo: Harrel Lemon Other Clinician: Referring Carliss Porcaro: Harrel Lemon Treating Saarah Dewing/Extender: Tito Dine in Treatment: 1 Active Inactive Wound/Skin Impairment Nursing Diagnoses: Impaired tissue integrity Knowledge deficit related to ulceration/compromised skin integrity Goals: Patient/caregiver will verbalize understanding of skin care regimen Date Initiated: 09/13/2020 Target Resolution Date: 09/26/2020 Goal Status: Active Interventions: Assess patient/caregiver ability to  obtain necessary supplies Assess patient/caregiver ability to perform ulcer/skin care regimen upon admission and as needed Assess ulceration(s) every visit Provide education on ulcer and skin care Treatment Activities: Skin care regimen initiated : 09/13/2020 Topical wound management initiated : 09/13/2020 Notes: Electronic Signature(s) Signed: 09/30/2020 10:00:34 AM By: Gretta Cool, BSN, RN, CWS, Kim RN, BSN Entered By: Gretta Cool, BSN, RN, CWS, Kim on 09/25/2020 73:71:06 Gilman, Maurine [name] ([id number]) -------------------------------------------------------------------------------- Pain Assessment Details Patient Name: [name]. Date of Service: 09/25/2020 12:45 PM Medical Record Number: [id number] Patient Account Number: [id number] Date of Birth/Sex: [birth year] (85 y.o. F) Treating RN: Army Melia Primary Care Gustavo Dispenza: Harrel Lemon Other Clinician: Referring Chief Walkup: Harrel Lemon Treating Sanel Stemmer/Extender: Tito Dine in Treatment: 1 Active Problems Location of Pain Severity and Description of Pain Patient Has Paino No Site Locations Pain Management and Medication Current Pain Management: Electronic Signature(s) Signed: 09/25/2020 2:40:35 PM By: Army Melia Entered By: Army Melia on 09/25/2020 18:29:93 [place], Maurine [name] ([id number]) -------------------------------------------------------------------------------- Patient/Caregiver Education Details Patient Name: [name] Date of Service: 09/25/2020 12:45 PM Medical Record Number: [id number] Patient Account Number: [id number] Date of Birth/Gender: 1935/09/15 (85 y.o. F) Treating RN: Cornell Barman Primary Care Physician: Harrel Lemon Other Clinician: Referring Physician: Harrel Lemon Treating Physician/Extender: Tito Dine in Treatment: 1 Education Assessment Education Provided To: Patient Education Topics Provided Wound/Skin Impairment: Handouts: Caring for Your Ulcer, Other: wound care  as prescribed Methods: Demonstration, Explain/Verbal Responses: State content correctly Electronic Signature(s) Signed: 09/30/2020 10:00:34 AM By: Gretta Cool, BSN, RN, CWS, Kim RN, BSN Entered By: Gretta Cool, BSN, RN, CWS, Kim on 09/25/2020 58:52:77 Ogando, Maurine [name] ([id number]) -------------------------------------------------------------------------------- Wound Assessment Details Patient Name: SUETTA, HOFFMEISTER. Date of Service: 09/25/2020 12:45 PM Medical Record Number: [id number] Patient Account Number: [id number] Date of Birth/Sex: [birth year] (85 y.o. F) Treating RN: Army Melia Primary Care Mihailo Sage: Harrel Lemon Other Clinician: Referring Birch Farino: Harrel Lemon Treating Eulia Hatcher/Extender: Tito Dine in Treatment: 1 Wound Status Wound Number: 4 Primary Skin Tear Etiology: Wound Location: Right, Distal, Anterior Lower Leg Wound Open Wounding Event: Gradually Appeared Status: Date Acquired: 09/09/2020 Notes: Patient states she got up in the night about 4 days ago and Weeks Of Treatment: 1 felt her lower right leg was wet. States it was weeping clear Clustered Wound: No fluid. States her skin opened up after that. Area looks like a skin tear. Patient denies hitting her leg on anything. Comorbid Cataracts, Anemia, Chronic Obstructive Pulmonary Disease History: (COPD), Type II Diabetes,  Received Radiation Photos Wound Measurements Length: (cm) 1.8 Width: (cm) 1.1 Depth: (cm) 0.1 Area: (cm) 1.555 Volume: (cm) 0.156 % Reduction in Area: -52.3% % Reduction in Volume: -52.9% Epithelialization: Small (1-33%) Wound Description Classification: Full Thickness Without Exposed Support Structures Exudate Amount: Small Exudate Type: Serous Exudate Color: amber Foul Odor After Cleansing: No Slough/Fibrino No Wound Bed Granulation Amount: Medium (34-66%) Exposed Structure Necrotic Amount: Small (1-33%) Fascia Exposed: No Necrotic Quality: Adherent Slough Fat Layer  (Subcutaneous Tissue) Exposed: Yes Tendon Exposed: No Muscle Exposed: No Joint Exposed: No Bone Exposed: No Electronic Signature(s) Signed: 09/25/2020 2:40:35 PM By: Army Melia Entered By: Army Melia on 09/25/2020 19:37:90 [place], Phillippa Jenetta Downer ([id number]) -------------------------------------------------------------------------------- Vitals Details Patient Name: Hagadorn, Maurine [name]. Date of Service: 09/25/2020 12:45 PM Medical Record Number: [id number] Patient Account Number: [id number] Date of Birth/Sex: 05/27/1935 (85 y.o. F) Treating RN: Army Melia Primary Care Tachina Spoonemore: Harrel Lemon Other Clinician: Referring Hae Ahlers: Harrel Lemon Treating Alando Colleran/Extender: Tito Dine in Treatment: 1 Vital Signs Time Taken: 12:45 Temperature (F): 97.9 Height (in): 64 Pulse (bpm): 92 Weight (lbs): 135 Respiratory Rate (breaths/min): 16 Body Mass Index (BMI): 23.2 Blood Pressure (mmHg): 159/73 Reference Range: 80 - 120 mg / dl Electronic Signature(s) Signed: 09/25/2020 2:40:35 PM By: Army Melia Entered By: Army Melia on 09/25/2020 12:45:25

## 2020-09-30 NOTE — Progress Notes (Addendum)
[name], [name] ([id number]) Visit Report for 09/30/2020 Chief Complaint Document Details Patient Name: [name]. Date of Service: 09/30/2020 8:45 AM Medical Record Number: [id number] Patient Account Number: [id number] Date of Birth/Sex: [birth year] (85 y.o. F) Treating RN: [name] Primary Care Provider: Harrel Lemon Other Clinician: Referring Provider: Harrel Lemon Treating Provider/Extender: [name], [name] in Treatment: 2 Information Obtained from: Patient Chief Complaint Right LE Skin Tear Electronic Signature(s) Signed: 09/30/2020 8:59:57 AM By: Deborah Keeler PA-C Entered By: [name] on 09/30/2020 56:43:32 Nelms, [name] ([id number]) -------------------------------------------------------------------------------- HPI Details Patient Name: [name] Date of Service: 09/30/2020 8:45 AM Medical Record Number: [id number] Patient Account Number: [id number] Date of Birth/Sex: [birth year] (85 y.o. F) Treating RN: [name] Primary Care Provider: Harrel Lemon Other Clinician: Referring Provider: Harrel Lemon Treating Provider/Extender: [name], [name] [name] in Treatment: 2 History of Present Illness HPI Description: 85 year old patient who is known to have diabetes mellitus and tobacco abuse has had a injury to the right posterior calf about a month ago. He has been treated with local care and 2 courses of antibiotics which include Keflex and doxycycline which she has completed. Smokes about a pack and a half of cigarettes a day and has a past medical history of COPD, diabetes mellitus, hyperlipidemia,psoriasis and hypothyroidism. Readmission: 04/04/18 patient presents today for a initial evaluation today concerning a right forearm skin tear which has been present for about two [name]. Currently need this point has been applied to the wound bed followed by a protective dressing that has been secured by wrapping instead of adhesive's. Currently the  patient's wound does seem to be doing better patient and her husband states that the excess skin from the skin tear was actually trimmed away prior to starting the wound care they have been performing. With that being said she seems to be doing well there's no evidence of infection which is good news and overall the wound seems to be showing signs of good granulation and overall improvement which is great news. Fortunately she has no with valid bladder dysfunction unexpected fevers chills or weight loss. This seems to be rather straightforward in my opinion in regard to treatment. 04/12/18 on evaluation today patient appears to be doing very well in regard to her right forearm skin tear. She's been tolerating the dressing changes without complication. In general this appears to show signs of great improvement since last time I saw her. Overall I'm very pleased with how things are going patient still somewhat scared about the fact that the skin that is healing over appears very fragile I explained that it does take time for this to really tough enough even once it closes over. Fortunately there is no evidence of infection. 04/19/18 on evaluation today patient appears to be doing very well in regard to her right forearm ulcer. This actually appears to be completely healed at this point which is good news. There does not appear to be any evidence of infection currently. She still has a little bit of discomfort due to some underlying irritation but overall she is doing excellent. Readmission: 02/14/19 on evaluation today patient actually appears to be doing very well all things considering in regard to her right shin ulcer which occurred as a result of a picture frame dropping onto the leg last Thursday. This is not been quite a week as of today. Because we couldn't get him today this happened she actually did go to urgent care was she with this guy  Keflex for week and also a pressure dressing was applied.  this has caused some issues with fluid buildup below and above the dressing unfortunately. Fortunately there does not appear to be signs of infection at this time which is excellent news No fevers, chills, nausea, or vomiting noted at this time. 02/21/19 on evaluation today patient actually appears to be doing much better at this point in regard to her right lower Trinity ulcer. She's been tolerating the dressing changes without complication. Fortunately there's no evidence of infection at this time. No fevers, chills, nausea, or vomiting noted at this time. 02/28/19 on evaluation today patient actually appears to be doing very well in regard to her lower extremity ulcer. She has been tolerating the dressing changes without complication. Fortunately this seems to be doing significantly better which is great news. Overall I'm very pleased with the progress. I do believe that the patient is doing excellent overall from the standpoint of her wound although there was some dressing stuck to the surface of the wound that did require sharp debridement today. Readmission: 09/13/2020 upon evaluation today patient appears to be doing well all things considered in regard to her right leg skin tear. She has been putting some Vaseline on this nothing else at this point. Fortunately there is no signs of active infection which is great news this happened she tells me about 4 days ago. She decided to come in and have me take a look at it to ensure nothing else was going on. She does not even remember how exactly she did this other than she had some burning/stinging on her leg and she went to rub it causing this to open. 10/6; follow-up with regards to her right lower extremity anterior skin tear. We put Steri-Strips on this last week. The skin around this especially medially is still viable which is good news. The wound is not totally opposed 10/13; 2-week follow-up. Patient has a right leg skin tear. I gather the  skin did not totally oppose however she has a very healthy small wound on the right anterior lower leg. We have been using Xeroform and conform with net 09/30/2020 upon evaluation today patient appears to actually be doing quite well in regard to her leg ulcer which in fact appears to be very close to complete resolution and healing. I am extremely pleased with where things stand and overall I think she will likely be healed in the next 1-2 [name]. Electronic Signature(s) Signed: 09/30/2020 9:53:19 AM By: Deborah Keeler PA-C Entered By: [name] on 09/30/2020 03:47:42 [name], [name] ([id number]) -------------------------------------------------------------------------------- Physical Exam Details Patient Name: Spires, Sady O. Date of Service: 09/30/2020 8:45 AM Medical Record Number: [id number] Patient Account Number: [id number] Date of Birth/Sex: 06/22/1935 (85 y.o. F) Treating RN: [name] Primary Care Provider: Harrel Lemon Other Clinician: Referring Provider: Harrel Lemon Treating Provider/Extender: [name], Adamarie Izzo [name] in Treatment: 2 Constitutional Well-nourished and well-hydrated in no acute distress. Respiratory normal breathing without difficulty. Psychiatric this patient is able to make decisions and demonstrates good insight into disease process. Alert and Oriented x 3. pleasant and cooperative. Notes Patient's wound bed actually showed signs of good granulation and epithelization there just very small areas open at this point overall I think she may quite possibly be completely healed at the next visit when I see her. Electronic Signature(s) Signed: 09/30/2020 9:53:34 AM By: Deborah Keeler PA-C Entered By: [name] on 09/30/2020 41:66:06 Navedo, [name] ([id number]) -------------------------------------------------------------------------------- Physician Orders Details  Patient Name: MARIJA, CALAMARI. Date of Service: 09/30/2020 8:45 AM Medical  Record Number: [id number] Patient Account Number: [id number] Date of Birth/Sex: 01/13/1935 (85 y.o. F) Treating RN: [name] Primary Care Provider: Harrel Lemon Other Clinician: Referring Provider: Harrel Lemon Treating Provider/Extender: [name], Chanan Detwiler [name] in Treatment: 2 Verbal / Phone Orders: No Diagnosis Coding ICD-10 Coding Code Description I89.0 Lymphedema, not elsewhere classified S81.801A Unspecified open wound, right lower leg, initial encounter Primary Wound Dressing Wound #4 Right,Distal,Anterior Lower Leg o Xeroform Secondary Dressing Wound #4 Right,Distal,Anterior Lower Leg o ABD and Kerlix/Conform o Other - secure with stretchnet Dressing Change Frequency Wound #4 Right,Distal,Anterior Lower Leg o Change dressing every week Follow-up Appointments Wound #4 Right,Distal,Anterior Lower Leg o Return Appointment in 1 week. o Nurse Visit as needed Electronic Signature(s) Signed: 09/30/2020 10:00:34 AM By: Gretta Cool, BSN, RN, CWS, Kim RN, BSN Signed: 10/02/2020 4:38:25 PM By: Deborah Keeler PA-C Entered By: Gretta Cool BSN, RN, CWS, Kim on 09/30/2020 84:16:60 Cheswick, [name] ([id number]) -------------------------------------------------------------------------------- Problem List Details Patient Name: JENE, HUQ. Date of Service: 09/30/2020 8:45 AM Medical Record Number: [id number] Patient Account Number: [id number] Date of Birth/Sex: Apr 20, 1935 (85 y.o. F) Treating RN: [name] Primary Care Provider: Harrel Lemon Other Clinician: Referring Provider: Harrel Lemon Treating Provider/Extender: [name], Osie Amparo [name] in Treatment: 2 Active Problems ICD-10 Encounter Code Description Active Date MDM Diagnosis I89.0 Lymphedema, not elsewhere classified 09/13/2020 No Yes S81.801A Unspecified open wound, right lower leg, initial encounter 09/13/2020 No Yes Inactive Problems Resolved Problems Electronic Signature(s) Signed: 09/30/2020 8:59:51 AM By:  Deborah Keeler PA-C Entered By: [name] on 09/30/2020 02:54:27 Miramontes, Jalie Jenetta [name] ([id number]) -------------------------------------------------------------------------------- Progress Note Details Patient Name: Walbert, Onya O. Date of Service: 09/30/2020 8:45 AM Medical Record Number: [id number] Patient Account Number: [id number] Date of Birth/Sex: August 09, 1935 (85 y.o. F) Treating RN: [name] Primary Care Provider: Harrel Lemon Other Clinician: Referring Provider: Harrel Lemon Treating Provider/Extender: [name], Ramona Ruark [name] in Treatment: 2 Subjective Chief Complaint Information obtained from Patient Right LE Skin Tear History of Present Illness (HPI) 85 year old patient who is known to have diabetes mellitus and tobacco abuse has had a injury to the right posterior calf about a month ago. He has been treated with local care and 2 courses of antibiotics which include Keflex and doxycycline which she has completed. Smokes about a pack and a half of cigarettes a day and has a past medical history of COPD, diabetes mellitus, hyperlipidemia,psoriasis and hypothyroidism. Readmission: 04/04/18 patient presents today for a initial evaluation today concerning a right forearm skin tear which has been present for about two [name]. Currently need this point has been applied to the wound bed followed by a protective dressing that has been secured by wrapping instead of adhesive's. Currently the patient's wound does seem to be doing better patient and her husband states that the excess skin from the skin tear was actually trimmed away prior to starting the wound care they have been performing. With that being said she seems to be doing well there's no evidence of infection which is good news and overall the wound seems to be showing signs of good granulation and overall improvement which is great news. Fortunately she has no with valid bladder dysfunction unexpected fevers chills or weight  loss. This seems to be rather straightforward in my opinion in regard to treatment. 04/12/18 on evaluation today patient appears to be doing very well in regard to her right forearm skin tear. She's been tolerating the dressing changes  without complication. In general this appears to show signs of great improvement since last time I saw her. Overall I'm very pleased with how things are going patient still somewhat scared about the fact that the skin that is healing over appears very fragile I explained that it does take time for this to really tough enough even once it closes over. Fortunately there is no evidence of infection. 04/19/18 on evaluation today patient appears to be doing very well in regard to her right forearm ulcer. This actually appears to be completely healed at this point which is good news. There does not appear to be any evidence of infection currently. She still has a little bit of discomfort due to some underlying irritation but overall she is doing excellent. Readmission: 02/14/19 on evaluation today patient actually appears to be doing very well all things considering in regard to her right shin ulcer which occurred as a result of a picture frame dropping onto the leg last Thursday. This is not been quite a week as of today. Because we couldn't get him today this happened she actually did go to urgent care was she with this guy Keflex for week and also a pressure dressing was applied. this has caused some issues with fluid buildup below and above the dressing unfortunately. Fortunately there does not appear to be signs of infection at this time which is excellent news No fevers, chills, nausea, or vomiting noted at this time. 02/21/19 on evaluation today patient actually appears to be doing much better at this point in regard to her right lower Trinity ulcer. She's been tolerating the dressing changes without complication. Fortunately there's no evidence of infection at this time.  No fevers, chills, nausea, or vomiting noted at this time. 02/28/19 on evaluation today patient actually appears to be doing very well in regard to her lower extremity ulcer. She has been tolerating the dressing changes without complication. Fortunately this seems to be doing significantly better which is great news. Overall I'm very pleased with the progress. I do believe that the patient is doing excellent overall from the standpoint of her wound although there was some dressing stuck to the surface of the wound that did require sharp debridement today. Readmission: 09/13/2020 upon evaluation today patient appears to be doing well all things considered in regard to her right leg skin tear. She has been putting some Vaseline on this nothing else at this point. Fortunately there is no signs of active infection which is great news this happened she tells me about 4 days ago. She decided to come in and have me take a look at it to ensure nothing else was going on. She does not even remember how exactly she did this other than she had some burning/stinging on her leg and she went to rub it causing this to open. 10/6; follow-up with regards to her right lower extremity anterior skin tear. We put Steri-Strips on this last week. The skin around this especially medially is still viable which is good news. The wound is not totally opposed 10/13; 2-week follow-up. Patient has a right leg skin tear. I gather the skin did not totally oppose however she has a very healthy small wound on the right anterior lower leg. We have been using Xeroform and conform with net 09/30/2020 upon evaluation today patient appears to actually be doing quite well in regard to her leg ulcer which in fact appears to be very close to complete resolution and healing. I am extremely  pleased with where things stand and overall I think she will likely be healed in the next 1-2 [name]. [name], [name]  ([id number]) Objective Constitutional Well-nourished and well-hydrated in no acute distress. Vitals Time Taken: 8:46 AM, Height: 64 in, Weight: 135 lbs, BMI: 23.2, Temperature: 97.7 F, Pulse: 87 bpm, Respiratory Rate: 16 breaths/min, Blood Pressure: 156/77 mmHg. Respiratory normal breathing without difficulty. Psychiatric this patient is able to make decisions and demonstrates good insight into disease process. Alert and Oriented x 3. pleasant and cooperative. General Notes: Patient's wound bed actually showed signs of good granulation and epithelization there just very small areas open at this point overall I think she may quite possibly be completely healed at the next visit when I see her. Integumentary (Hair, Skin) Wound #4 status is Open. Original cause of wound was Gradually Appeared. The wound is located on the Right,Distal,Anterior Lower Leg. The wound measures 1.5cm length x 1cm width x 0.1cm depth; 1.178cm^2 area and 0.118cm^3 volume. There is Fat Layer (Subcutaneous Tissue) exposed. There is no tunneling or undermining noted. There is a small amount of serous drainage noted. There is large (67-100%) pink granulation within the wound bed. There is a small (1-33%) amount of necrotic tissue within the wound bed including Adherent Slough. Assessment Active Problems ICD-10 Lymphedema, not elsewhere classified Unspecified open wound, right lower leg, initial encounter Plan Primary Wound Dressing: Wound #4 Right,Distal,Anterior Lower Leg: Xeroform Secondary Dressing: Wound #4 Right,Distal,Anterior Lower Leg: ABD and Kerlix/Conform Other - secure with stretchnet Dressing Change Frequency: Wound #4 Right,Distal,Anterior Lower Leg: Change dressing every week Follow-up Appointments: Wound #4 Right,Distal,Anterior Lower Leg: Return Appointment in 1 week. Nurse Visit as needed 1. I would recommend currently that we continue with the wound care measures as before and the patient is  in agreement with that plan. This is good to be utilizing the Xeroform gauze dressing. 2. I am also can recommend at this time that the patient continue to secure this with an ABD pad and roll gauze along with stretch net she is just keeping this in place. 3. I am also can recommend that if anything slides down or changes she will contact the office and let me know. With that being said obviously I think that if this stays in place as it did before she would likely be healed by the time anything more to slide or come off. We will see patient back for reevaluation in 1 week here in the clinic. If anything worsens or changes patient will contact our office for additional recommendations. [name], [name] ([id number]) Electronic Signature(s) Signed: 09/30/2020 9:54:24 AM By: Deborah Keeler PA-C Entered By: [name] on 09/30/2020 93:23:55 Pondexter, Alaura Jenetta [name] ([id number]) -------------------------------------------------------------------------------- SuperBill Details Patient Name: [name] Date of Service: 09/30/2020 Medical Record Number: [id number] Patient Account Number: [id number] Date of Birth/Sex: 05/04/1935 (85 y.o. F) Treating RN: [name] Primary Care Provider: Harrel Lemon Other Clinician: Referring Provider: Harrel Lemon Treating Provider/Extender: [name], Hildred Pharo [name] in Treatment: 2 Diagnosis Coding ICD-10 Codes Code Description I89.0 Lymphedema, not elsewhere classified S81.801A Unspecified open wound, right lower leg, initial encounter Facility Procedures CPT4 Code: [id number] Description: 99213 - WOUND CARE VISIT-LEV 3 EST PT Modifier: Quantity: 1 Physician Procedures CPT4 Code: [id number] Description: 26948 - WC PHYS LEVEL 3 - EST PT Modifier: Quantity: 1 CPT4 Code: Description: ICD-10 Diagnosis Description I89.0 Lymphedema, not elsewhere classified S81.801A Unspecified open wound, right lower leg, initial encounter Modifier: Quantity: Electronic  Signature(s) Signed: 09/30/2020 9:54:39 AM By: Joaquim Lai  III, Zael Shuman PA-C Entered By: [name] on 09/30/2020 09:54:38

## 2020-09-30 NOTE — Progress Notes (Signed)
[name], Deborah ([id number]) Visit Report for 09/25/2020 HPI Details Patient Name: [name], [name]. Date of Service: 09/25/2020 12:45 PM Medical Record Number: [id number] Patient Account Number: [id number] Date of Birth/Sex: 03/05/1935 (85 y.o. F) Treating RN: [name] Primary Care Provider: Harrel [name] Other Clinician: Referring Provider: Harrel [name] Treating Provider/Extender: [name] in Treatment: 1 History of Present Illness HPI Description: 85 year old patient who is known to have diabetes mellitus and tobacco abuse has had a injury to the right posterior calf about a month ago. He has been treated with local care and 2 courses of antibiotics which include Keflex and doxycycline which she has completed. Smokes about a pack and a half of cigarettes a day and has a past medical history of COPD, diabetes mellitus, hyperlipidemia,psoriasis and hypothyroidism. Readmission: 04/04/18 patient presents today for a initial evaluation today concerning a right forearm skin tear which has been present for about two weeks. Currently need this point has been applied to the wound bed followed by a protective dressing that has been secured by wrapping instead of adhesive's. Currently the patient's wound does seem to be doing better patient and her husband states that the excess skin from the skin tear was actually trimmed away prior to starting the wound care they have been performing. With that being said she seems to be doing well there's no evidence of infection which is good news and overall the wound seems to be showing signs of good granulation and overall improvement which is great news. Fortunately she has no with valid bladder dysfunction unexpected fevers chills or weight loss. This seems to be rather straightforward in my opinion in regard to treatment. 04/12/18 on evaluation today patient appears to be doing very well in regard to her right forearm skin tear. She's been  tolerating the dressing changes without complication. In general this appears to show signs of great improvement since last time I saw her. Overall I'm very pleased with how things are going patient still somewhat scared about the fact that the skin that is healing over appears very fragile I explained that it does take time for this to really tough enough even once it closes over. Fortunately there is no evidence of infection. 04/19/18 on evaluation today patient appears to be doing very well in regard to her right forearm ulcer. This actually appears to be completely healed at this point which is good news. There does not appear to be any evidence of infection currently. She still has a little bit of discomfort due to some underlying irritation but overall she is doing excellent. Readmission: 02/14/19 on evaluation today patient actually appears to be doing very well all things considering in regard to her right shin ulcer which occurred as a result of a picture frame dropping onto the leg last Thursday. This is not been quite a week as of today. Because we couldn't get him today this happened she actually did go to urgent care was she with this guy Keflex for week and also a pressure dressing was applied. this has caused some issues with fluid buildup below and above the dressing unfortunately. Fortunately there does not appear to be signs of infection at this time which is excellent news No fevers, chills, nausea, or vomiting noted at this time. 02/21/19 on evaluation today patient actually appears to be doing much better at this point in regard to her right lower Trinity ulcer. She's been tolerating the dressing changes without complication. Fortunately there's no evidence of infection  at this time. No fevers, chills, nausea, or vomiting noted at this time. 02/28/19 on evaluation today patient actually appears to be doing very well in regard to her lower extremity ulcer. She has been tolerating  the dressing changes without complication. Fortunately this seems to be doing significantly better which is great news. Overall I'm very pleased with the progress. I do believe that the patient is doing excellent overall from the standpoint of her wound although there was some dressing stuck to the surface of the wound that did require sharp debridement today. Readmission: 09/13/2020 upon evaluation today patient appears to be doing well all things considered in regard to her right leg skin tear. She has been putting some Vaseline on this nothing else at this point. Fortunately there is no signs of active infection which is great news this happened she tells me about 4 days ago. She decided to come in and have me take a look at it to ensure nothing else was going on. She does not even remember how exactly she did this other than she had some burning/stinging on her leg and she went to rub it causing this to open. 10/6; follow-up with regards to her right lower extremity anterior skin tear. We put Steri-Strips on this last week. The skin around this especially medially is still viable which is good news. The wound is not totally opposed 10/13; 2-week follow-up. Patient has a right leg skin tear. I gather the skin did not totally oppose however she has a very healthy small wound on the right anterior lower leg. We have been using Xeroform and conform with net Electronic Signature(s) Signed: 09/25/2020 4:31:41 PM By: Deborah Ham MD Entered By: [name] on 09/25/2020 78:46:96 [name], [name] ([id number]) -------------------------------------------------------------------------------- Physical Exam Details Patient Name: [name], [name]. Date of Service: 09/25/2020 12:45 PM Medical Record Number: [id number] Patient Account Number: [id number] Date of Birth/Sex: 1935/06/08 (85 y.o. F) Treating RN: [name] Primary Care Provider: Harrel [name] Other Clinician: Referring Provider: Harrel [name] Treating Provider/Extender: [name] in Treatment: 1 Constitutional Patient is hypertensive.. Pulse regular and within target range for patient.Marland Kitchen Respirations regular, non-labored and within target range.. Temperature is normal and within the target range for the patient.Marland Kitchen appears in no distress. Cardiovascular Pedal pulses are palpable. Good edema control. Notes Wound exam; skin tear on the anterior part of her right upper leg. This has a clean healthy surface. No debridement was required. Around the edges of the wound what looks to be healthy epithelialization. There is no evidence of infection. We have good edema control. Pedal pulses are palpable Electronic Signature(s) Signed: 09/25/2020 4:31:41 PM By: Deborah Ham MD Entered By: [name] on 09/25/2020 36:64:40 [name] ([id number]) -------------------------------------------------------------------------------- Physician Orders Details Patient Name: [name] Date of Service: 09/25/2020 12:45 PM Medical Record Number: [id number] Patient Account Number: [id number] Date of Birth/Sex: 23-Feb-1935 (85 y.o. F) Treating RN: [name] Primary Care Provider: Harrel [name] Other Clinician: Referring Provider: Harrel [name] Treating Provider/Extender: [name] in Treatment: 1 Verbal / Phone Orders: No Diagnosis Coding Primary Wound Dressing Wound #4 Right,Distal,Anterior Lower Leg o Xeroform Secondary Dressing Wound #4 Right,Distal,Anterior Lower Leg o ABD and Kerlix/Conform o Other - secure with stretchnet Dressing Change Frequency Wound #4 Right,Distal,Anterior Lower Leg o Change dressing every week Follow-up Appointments Wound #4 Right,Distal,Anterior Lower Leg o Return Appointment in 1 week. - nurse visit next week o Nurse Visit as needed Electronic Signature(s) Signed: 09/25/2020 4:31:41 PM By:  Deborah Ham MD Signed: 09/30/2020 10:00:34 AM By:  Gretta Cool, BSN, RN, CWS, Kim RN, BSN Entered By: Gretta Cool, BSN, RN, CWS, Kim on 09/25/2020 49:70:26 [name], [name] ([id number]) -------------------------------------------------------------------------------- Problem List Details Patient Name: CHERITY, BLICKENSTAFF. Date of Service: 09/25/2020 12:45 PM Medical Record Number: [id number] Patient Account Number: [id number] Date of Birth/Sex: [birth year] (85 y.o. F) Treating RN: [name] Primary Care Provider: Harrel [name] Other Clinician: Referring Provider: Harrel [name] Treating Provider/Extender: [name] in Treatment: 1 Active Problems ICD-10 Encounter Code Description Active Date MDM Diagnosis I89.0 Lymphedema, not elsewhere classified 09/13/2020 No Yes S81.801A Unspecified open wound, right lower leg, initial encounter 09/13/2020 No Yes Inactive Problems Resolved Problems Electronic Signature(s) Signed: 09/25/2020 4:31:41 PM By: Deborah Ham MD Entered By: [name] on 09/25/2020 76:72:09 Carbo, Challis Jenetta Downer ([id number]) -------------------------------------------------------------------------------- Progress Note Details Patient Name: [name], [name]. Date of Service: 09/25/2020 12:45 PM Medical Record Number: [id number] Patient Account Number: [id number] Date of Birth/Sex: 1935-10-27 (85 y.o. F) Treating RN: [name] Primary Care Provider: Harrel [name] Other Clinician: Referring Provider: Harrel [name] Treating Provider/Extender: [name] in Treatment: 1 Subjective History of Present Illness (HPI) 85 year old patient who is known to have diabetes mellitus and tobacco abuse has had a injury to the right posterior calf about a month ago. He has been treated with local care and 2 courses of antibiotics which include Keflex and doxycycline which she has completed. Smokes about a pack and a half of cigarettes a day and has a past medical history of COPD, diabetes mellitus, hyperlipidemia,psoriasis  and hypothyroidism. Readmission: 04/04/18 patient presents today for a initial evaluation today concerning a right forearm skin tear which has been present for about two weeks. Currently need this point has been applied to the wound bed followed by a protective dressing that has been secured by wrapping instead of adhesive's. Currently the patient's wound does seem to be doing better patient and her husband states that the excess skin from the skin tear was actually trimmed away prior to starting the wound care they have been performing. With that being said she seems to be doing well there's no evidence of infection which is good news and overall the wound seems to be showing signs of good granulation and overall improvement which is great news. Fortunately she has no with valid bladder dysfunction unexpected fevers chills or weight loss. This seems to be rather straightforward in my opinion in regard to treatment. 04/12/18 on evaluation today patient appears to be doing very well in regard to her right forearm skin tear. She's been tolerating the dressing changes without complication. In general this appears to show signs of great improvement since last time I saw her. Overall I'm very pleased with how things are going patient still somewhat scared about the fact that the skin that is healing over appears very fragile I explained that it does take time for this to really tough enough even once it closes over. Fortunately there is no evidence of infection. 04/19/18 on evaluation today patient appears to be doing very well in regard to her right forearm ulcer. This actually appears to be completely healed at this point which is good news. There does not appear to be any evidence of infection currently. She still has a little bit of discomfort due to some underlying irritation but overall she is doing excellent. Readmission: 02/14/19 on evaluation today patient actually appears to be doing very well all  things considering in regard to her right shin  ulcer which occurred as a result of a picture frame dropping onto the leg last Thursday. This is not been quite a week as of today. Because we couldn't get him today this happened she actually did go to urgent care was she with this guy Keflex for week and also a pressure dressing was applied. this has caused some issues with fluid buildup below and above the dressing unfortunately. Fortunately there does not appear to be signs of infection at this time which is excellent news No fevers, chills, nausea, or vomiting noted at this time. 02/21/19 on evaluation today patient actually appears to be doing much better at this point in regard to her right lower Trinity ulcer. She's been tolerating the dressing changes without complication. Fortunately there's no evidence of infection at this time. No fevers, chills, nausea, or vomiting noted at this time. 02/28/19 on evaluation today patient actually appears to be doing very well in regard to her lower extremity ulcer. She has been tolerating the dressing changes without complication. Fortunately this seems to be doing significantly better which is great news. Overall I'm very pleased with the progress. I do believe that the patient is doing excellent overall from the standpoint of her wound although there was some dressing stuck to the surface of the wound that did require sharp debridement today. Readmission: 09/13/2020 upon evaluation today patient appears to be doing well all things considered in regard to her right leg skin tear. She has been putting some Vaseline on this nothing else at this point. Fortunately there is no signs of active infection which is great news this happened she tells me about 4 days ago. She decided to come in and have me take a look at it to ensure nothing else was going on. She does not even remember how exactly she did this other than she had some burning/stinging on her leg and she  went to rub it causing this to open. 10/6; follow-up with regards to her right lower extremity anterior skin tear. We put Steri-Strips on this last week. The skin around this especially medially is still viable which is good news. The wound is not totally opposed 10/13; 2-week follow-up. Patient has a right leg skin tear. I gather the skin did not totally oppose however she has a very healthy small wound on the right anterior lower leg. We have been using Xeroform and conform with net Objective Constitutional [name], [name] ([id number]) Patient is hypertensive.. Pulse regular and within target range for patient.Marland Kitchen Respirations regular, non-labored and within target range.. Temperature is normal and within the target range for the patient.Marland Kitchen appears in no distress. Vitals Time Taken: 12:45 PM, Height: 64 in, Weight: 135 lbs, BMI: 23.2, Temperature: 97.9 F, Pulse: 92 bpm, Respiratory Rate: 16 breaths/min, Blood Pressure: 159/73 mmHg. Cardiovascular Pedal pulses are palpable. Good edema control. General Notes: Wound exam; skin tear on the anterior part of her right upper leg. This has a clean healthy surface. No debridement was required. Around the edges of the wound what looks to be healthy epithelialization. There is no evidence of infection. We have good edema control. Pedal pulses are palpable Integumentary (Hair, Skin) Wound #4 status is Open. Original cause of wound was Gradually Appeared. The wound is located on the Right,Distal,Anterior Lower Leg. The wound measures 1.8cm length x 1.1cm width x 0.1cm depth; 1.555cm^2 area and 0.156cm^3 volume. There is Fat Layer (Subcutaneous Tissue) exposed. There is a small amount of serous drainage noted. There is medium (34-66%) granulation  within the wound bed. There is a small (1-33%) amount of necrotic tissue within the wound bed including Adherent Slough. Assessment Active Problems ICD-10 Lymphedema, not elsewhere classified Unspecified open  wound, right lower leg, initial encounter Plan Primary Wound Dressing: Wound #4 Right,Distal,Anterior Lower Leg: Xeroform Secondary Dressing: Wound #4 Right,Distal,Anterior Lower Leg: ABD and Kerlix/Conform Other - secure with stretchnet Dressing Change Frequency: Wound #4 Right,Distal,Anterior Lower Leg: Change dressing every week Follow-up Appointments: Wound #4 Right,Distal,Anterior Lower Leg: Return Appointment in 1 week. - nurse visit next week Nurse Visit as needed #1 I actually think the wound looks quite good. I used Xeroform ABDs Kerlix and conform which we continue from last time. There was no point in Steri-Strip in this today 2. The patient was disappointed that the wound was more widely open however I emphasized that the surface of this is healthy with a nice rim of epithelialization. The outcome could have been a lot worse. 3. The patient does not have anybody at home to help her with this dressing. The alternative here would be [employer]) Signed: 09/25/2020 4:31:41 PM By: Deborah Ham MD Entered By: [name] on 09/25/2020 51:70:01 [name], [name] ([id number]) -------------------------------------------------------------------------------- SuperBill Details Patient Name: [name] Date of Service: 09/25/2020 Medical Record Number: [id number] Patient Account Number: [id number] Date of Birth/Sex: [birth year] (85 y.o. F) Treating RN: [name] Primary Care Provider: Harrel [name] Other Clinician: Referring Provider: Harrel [name] Treating Provider/Extender: [name] in Treatment: 1 Diagnosis Coding ICD-10 Codes Code Description I89.0 Lymphedema, not elsewhere classified S81.801A Unspecified open wound, right lower leg, initial encounter Facility Procedures CPT4 Code: 99357017 Description: 79390 - WOUND CARE VISIT-LEV 3 EST PT Modifier: Quantity: 1 Physician Procedures CPT4 Code: 3009233 Description:  00762 - WC PHYS LEVEL 3 - EST PT Modifier: Quantity: 1 CPT4 Code: Description: ICD-10 Diagnosis Description I89.0 Lymphedema, not elsewhere classified S81.801A Unspecified open wound, right lower leg, initial encounter Modifier: Quantity: Electronic Signature(s) Signed: 09/25/2020 4:31:41 PM By: Deborah Ham MD Entered By: [name] on 09/25/2020 13:30:54

## 2020-10-02 NOTE — Progress Notes (Signed)
[name] ([id number]) Visit Report for 09/30/2020 Arrival Information Details Patient Name: [name], [name]. Date of Service: 09/30/2020 8:45 AM Medical Record Number: [id number] Patient Account Number: [id number] Date of Birth/Sex: 06-13-1935 (85 y.o. F) Treating RN: Carlene Coria Primary Care Lindsy Cerullo: Harrel Lemon Other Clinician: Referring Cobi Delph: Harrel Lemon Treating Trice Aspinall/Extender: Melburn Hake, HOYT Weeks in Treatment: 2 Visit Information History Since Last Visit All ordered tests and consults were completed: No Patient Arrived: Ambulatory Added or deleted any medications: No Arrival Time: 08:46 Any new allergies or adverse reactions: No Accompanied By: self Had a fall or experienced change in No Transfer Assistance: None activities of daily living that may affect Patient Identification Verified: Yes risk of falls: Secondary Verification Process Completed: Yes Signs or symptoms of abuse/neglect since last visito No Patient Requires Transmission-Based Precautions: No Hospitalized since last visit: No Patient Has Alerts: No Implantable device outside of the clinic excluding No cellular tissue based products placed in the center since last visit: Has Dressing in Place as Prescribed: Yes Pain Present Now: No Electronic Signature(s) Signed: 10/02/2020 4:54:21 PM By: Carlene Coria RN Entered By: Carlene Coria on 09/30/2020 69:62:95 [name], [name] ([id number]) -------------------------------------------------------------------------------- Clinic Level of Care Assessment Details Patient Name: [name], [name]. Date of Service: 09/30/2020 8:45 AM Medical Record Number: [id number] Patient Account Number: [id number] Date of Birth/Sex: 02-Nov-1935 (85 y.o. F) Treating RN: Cornell Barman Primary Care Irais Mottram: Harrel Lemon Other Clinician: Referring Rocket Gunderson: Harrel Lemon Treating Syon Tews/Extender: Melburn Hake, HOYT Weeks in Treatment: 2 Clinic Level of Care  Assessment Items TOOL 4 Quantity Score []  - Use when only an EandM is performed on FOLLOW-UP visit 0 ASSESSMENTS - Nursing Assessment / Reassessment X - Reassessment of Co-morbidities (includes updates in patient status) 1 10 X- 1 5 Reassessment of Adherence to Treatment Plan ASSESSMENTS - Wound and Skin Assessment / Reassessment X - Simple Wound Assessment / Reassessment - one wound 1 5 []  - 0 Complex Wound Assessment / Reassessment - multiple wounds []  - 0 Dermatologic / Skin Assessment (not related to wound area) ASSESSMENTS - Focused Assessment []  - Circumferential Edema Measurements - multi extremities 0 []  - 0 Nutritional Assessment / Counseling / Intervention []  - 0 Lower Extremity Assessment (monofilament, tuning fork, pulses) []  - 0 Peripheral Arterial Disease Assessment (using hand held doppler) ASSESSMENTS - Ostomy and/or Continence Assessment and Care []  - Incontinence Assessment and Management 0 []  - 0 Ostomy Care Assessment and Management (repouching, etc.) PROCESS - Coordination of Care X - Simple Patient / Family Education for ongoing care 1 15 []  - 0 Complex (extensive) Patient / Family Education for ongoing care []  - 0 Staff obtains Programmer, systems, Records, Test Results / Process Orders []  - 0 Staff telephones HHA, Nursing Homes / Clarify orders / etc []  - 0 Routine Transfer to another Facility (non-emergent condition) []  - 0 Routine Hospital Admission (non-emergent condition) []  - 0 New Admissions / Biomedical engineer / Ordering NPWT, Apligraf, etc. []  - 0 Emergency Hospital Admission (emergent condition) X- 1 10 Simple Discharge Coordination []  - 0 Complex (extensive) Discharge Coordination PROCESS - Special Needs []  - Pediatric / Minor Patient Management 0 []  - 0 Isolation Patient Management []  - 0 Hearing / Language / Visual special needs []  - 0 Assessment of Community assistance (transportation, D/C planning, etc.) []  - 0 Additional  assistance / Altered mentation []  - 0 Support Surface(s) Assessment (bed, cushion, seat, etc.) INTERVENTIONS - Wound Cleansing / Measurement [name], [name] ([id number]) X- 1 5 Simple Wound Cleansing -  one wound []  - 0 Complex Wound Cleansing - multiple wounds X- 1 5 Wound Imaging (photographs - any number of wounds) []  - 0 Wound Tracing (instead of photographs) X- 1 5 Simple Wound Measurement - one wound []  - 0 Complex Wound Measurement - multiple wounds INTERVENTIONS - Wound Dressings []  - Small Wound Dressing one or multiple wounds 0 X- 1 15 Medium Wound Dressing one or multiple wounds []  - 0 Large Wound Dressing one or multiple wounds []  - 0 Application of Medications - topical []  - 0 Application of Medications - injection INTERVENTIONS - Miscellaneous []  - External ear exam 0 []  - 0 Specimen Collection (cultures, biopsies, blood, body fluids, etc.) []  - 0 Specimen(s) / Culture(s) sent or taken to Lab for analysis []  - 0 Patient Transfer (multiple staff / Civil Service fast streamer / Similar devices) []  - 0 Simple Staple / Suture removal (25 or less) []  - 0 Complex Staple / Suture removal (26 or more) []  - 0 Hypo / Hyperglycemic Management (close monitor of Blood Glucose) []  - 0 Ankle / Brachial Index (ABI) - do not check if billed separately X- 1 5 Vital Signs Has the patient been seen at the hospital within the last three years: Yes Total Score: 80 Level Of Care: New/Established - Level 3 Electronic Signature(s) Signed: 09/30/2020 10:00:34 AM By: Gretta Cool, BSN, RN, CWS, Kim RN, BSN Entered By: Gretta Cool, BSN, RN, CWS, Kim on 09/30/2020 61:60:73 [name] ([id number]) -------------------------------------------------------------------------------- Encounter Discharge Information Details Patient Name: [name]. Date of Service: 09/30/2020 8:45 AM Medical Record Number: [id number] Patient Account Number: [id number] Date of Birth/Sex: February 09, 1935 (85 y.o. F) Treating  RN: Cornell Barman Primary Care Darryle Dennie: Harrel Lemon Other Clinician: Referring Marylouise Mallet: Harrel Lemon Treating Anniece Bleiler/Extender: Melburn Hake, HOYT Weeks in Treatment: 2 Encounter Discharge Information Items Discharge Condition: Stable Ambulatory Status: Ambulatory Discharge Destination: Home Transportation: Private Auto Accompanied By: self Schedule Follow-up Appointment: Yes Clinical Summary of Care: Electronic Signature(s) Signed: 09/30/2020 10:00:34 AM By: Gretta Cool, BSN, RN, CWS, Kim RN, BSN Entered By: Gretta Cool, BSN, RN, CWS, Kim on 09/30/2020 09:38:18 Salomon, [name] ([id number]) -------------------------------------------------------------------------------- Lower Extremity Assessment Details Patient Name: LINDYN, VOSSLER. Date of Service: 09/30/2020 8:45 AM Medical Record Number: [id number] Patient Account Number: [id number] Date of Birth/Sex: [birth year] (85 y.o. F) Treating RN: Carlene Coria Primary Care May Manrique: Harrel Lemon Other Clinician: Referring Jorje Vanatta: Harrel Lemon Treating Evee Liska/Extender: STONE III, HOYT Weeks in Treatment: 2 Edema Assessment Assessed: [Left: No] [Right: No] [Left: Edema] [Right: :] Calf Left: Right: Point of Measurement: From Medial Instep 29 cm Ankle Left: Right: Point of Measurement: From Medial Instep 19 cm Electronic Signature(s) Signed: 10/02/2020 4:54:21 PM By: Carlene Coria RN Entered By: Carlene Coria on 09/30/2020 51:02:58 [name], [name] ([id number]) -------------------------------------------------------------------------------- Multi Wound Chart Details Patient Name: Duross, [name]. Date of Service: 09/30/2020 8:45 AM Medical Record Number: [id number] Patient Account Number: [id number] Date of Birth/Sex: 08/03/1935 (85 y.o. F) Treating RN: Cornell Barman Primary Care Wilbur Oakland: Harrel Lemon Other Clinician: Referring Denielle Bayard: Harrel Lemon Treating Manette Doto/Extender: Melburn Hake, HOYT Weeks in Treatment: 2 Vital  Signs Height(in): 25 Pulse(bpm): 53 Weight(lbs): 135 Blood Pressure(mmHg): 156/77 Body Mass Index(BMI): 23 Temperature(F): 97.7 Respiratory Rate(breaths/min): 16 Photos: [N/A:N/A] Wound Location: Right, Distal, Anterior Lower Leg N/A N/A Wounding Event: Gradually Appeared N/A N/A Primary Etiology: Skin Tear N/A N/A Comorbid History: Cataracts, Anemia, Chronic N/A N/A Obstructive Pulmonary Disease (COPD), Type II Diabetes, Received Radiation Date Acquired: 09/09/2020 N/A N/A Weeks of Treatment: 2 N/A N/A Wound Status: Open N/A  N/A Measurements L x W x D (cm) 1.5x1x0.1 N/A N/A Area (cm) : 1.178 N/A N/A Volume (cm) : 0.118 N/A N/A % Reduction in Area: -15.40% N/A N/A % Reduction in Volume: -15.70% N/A N/A Classification: Full Thickness Without Exposed N/A N/A Support Structures Exudate Amount: Small N/A N/A Exudate Type: Serous N/A N/A Exudate Color: amber N/A N/A Granulation Amount: Large (67-100%) N/A N/A Granulation Quality: Pink N/A N/A Necrotic Amount: Small (1-33%) N/A N/A Exposed Structures: Fat Layer (Subcutaneous Tissue): N/A N/A Yes Fascia: No Tendon: No Muscle: No Joint: No Bone: No Epithelialization: Small (1-33%) N/A N/A Treatment Notes Electronic Signature(s) Signed: 09/30/2020 10:00:34 AM By: Gretta Cool, BSN, RN, CWS, Kim RN, BSN Entered By: Gretta Cool, BSN, RN, CWS, Kim on 09/30/2020 46:27:03 [name], [name] ([id number]) -------------------------------------------------------------------------------- Multi-Disciplinary Care Plan Details Patient Name: [name], [name]. Date of Service: 09/30/2020 8:45 AM Medical Record Number: [id number] Patient Account Number: [id number] Date of Birth/Sex: Mar 27, 1935 (85 y.o. F) Treating RN: Cornell Barman Primary Care Kairie Vangieson: Harrel Lemon Other Clinician: Referring Jayne Peckenpaugh: Harrel Lemon Treating Uno Esau/Extender: Melburn Hake, HOYT Weeks in Treatment: 2 Active Inactive Wound/Skin Impairment Nursing Diagnoses: Impaired  tissue integrity Knowledge deficit related to ulceration/compromised skin integrity Goals: Patient/caregiver will verbalize understanding of skin care regimen Date Initiated: 09/13/2020 Target Resolution Date: 09/26/2020 Goal Status: Active Interventions: Assess patient/caregiver ability to obtain necessary supplies Assess patient/caregiver ability to perform ulcer/skin care regimen upon admission and as needed Assess ulceration(s) every visit Provide education on ulcer and skin care Treatment Activities: Skin care regimen initiated : 09/13/2020 Topical wound management initiated : 09/13/2020 Notes: Electronic Signature(s) Signed: 09/30/2020 10:00:34 AM By: Gretta Cool, BSN, RN, CWS, Kim RN, BSN Entered By: Gretta Cool, BSN, RN, CWS, Kim on 09/30/2020 89:38:10 [name], [name] ([id number]) -------------------------------------------------------------------------------- Pain Assessment Details Patient Name: [name]. Date of Service: 09/30/2020 8:45 AM Medical Record Number: [id number] Patient Account Number: [id number] Date of Birth/Sex: [birth year] (85 y.o. F) Treating RN: Carlene Coria Primary Care Deoni Cosey: Harrel Lemon Other Clinician: Referring Puja Caffey: Harrel Lemon Treating Algenis Ballin/Extender: Melburn Hake, HOYT Weeks in Treatment: 2 Active Problems Location of Pain Severity and Description of Pain Patient Has Paino No Site Locations Pain Management and Medication Current Pain Management: Electronic Signature(s) Signed: 10/02/2020 4:54:21 PM By: Carlene Coria RN Entered By: Carlene Coria on 09/30/2020 36:14:43 [name], [name] ([id number]) -------------------------------------------------------------------------------- Patient/Caregiver Education Details Patient Name: [name] Date of Service: 09/30/2020 8:45 AM Medical Record Number: [id number] Patient Account Number: [id number] Date of Birth/Gender: January 09, 1935 (85 y.o. F) Treating RN: Cornell Barman Primary Care Physician:  Harrel Lemon Other Clinician: Referring Physician: Harrel Lemon Treating Physician/Extender: Melburn Hake, HOYT Weeks in Treatment: 2 Education Assessment Education Provided To: Patient Education Topics Provided Wound/Skin Impairment: Handouts: Caring for Your Ulcer Methods: Demonstration, Explain/Verbal Responses: State content correctly Electronic Signature(s) Signed: 09/30/2020 10:00:34 AM By: Gretta Cool, BSN, RN, CWS, Kim RN, BSN Entered By: Gretta Cool, BSN, RN, CWS, Kim on 09/30/2020 12:45:80 [name] ([id number]) -------------------------------------------------------------------------------- Wound Assessment Details Patient Name: WAYNESHA, RAMMEL. Date of Service: 09/30/2020 8:45 AM Medical Record Number: [id number] Patient Account Number: [id number] Date of Birth/Sex: 1935-11-29 (85 y.o. F) Treating RN: Carlene Coria Primary Care Jaizon Deroos: Harrel Lemon Other Clinician: Referring Aujanae Mccullum: Harrel Lemon Treating Cleo Villamizar/Extender: Melburn Hake, HOYT Weeks in Treatment: 2 Wound Status Wound Number: 4 Primary Skin Tear Etiology: Wound Location: Right, Distal, Anterior Lower Leg Wound Open Wounding Event: Gradually Appeared Status: Date Acquired: 09/09/2020 Notes: Patient states she got up in the night about 4 days ago and Weeks Of Treatment: 2  felt her lower right leg was wet. States it was weeping clear Clustered Wound: No fluid. States her skin opened up after that. Area looks like a skin tear. Patient denies hitting her leg on anything. Comorbid Cataracts, Anemia, Chronic Obstructive Pulmonary Disease History: (COPD), Type II Diabetes, Received Radiation Photos Wound Measurements Length: (cm) 1.5 Width: (cm) 1 Depth: (cm) 0.1 Area: (cm) 1.178 Volume: (cm) 0.118 % Reduction in Area: -15.4% % Reduction in Volume: -15.7% Epithelialization: Small (1-33%) Tunneling: No Undermining: No Wound Description Classification: Full Thickness Without Exposed Support  Structures Exudate Amount: Small Exudate Type: Serous Exudate Color: amber Foul Odor After Cleansing: No Slough/Fibrino Yes Wound Bed Granulation Amount: Large (67-100%) Exposed Structure Granulation Quality: Pink Fascia Exposed: No Necrotic Amount: Small (1-33%) Fat Layer (Subcutaneous Tissue) Exposed: Yes Necrotic Quality: Adherent Slough Tendon Exposed: No Muscle Exposed: No Joint Exposed: No Bone Exposed: No Treatment Notes Wound #4 (Right, Distal, Anterior Lower Leg) Notes xeroform, abd, conform, stretchnet, Geno, Essa O. ([id number]) Electronic Signature(s) Signed: 10/02/2020 4:54:21 PM By: Carlene Coria RN Entered By: Carlene Coria on 09/30/2020 29:79:89 Odenthal, Delayza Jenetta [name] ([id number]) -------------------------------------------------------------------------------- Vitals Details Patient Name: [name]. Date of Service: 09/30/2020 8:45 AM Medical Record Number: [id number] Patient Account Number: [id number] Date of Birth/Sex: Aug 09, 1935 (85 y.o. F) Treating RN: Carlene Coria Primary Care Marla Pouliot: Harrel Lemon Other Clinician: Referring Roniel Halloran: Harrel Lemon Treating Hartford Maulden/Extender: Melburn Hake, HOYT Weeks in Treatment: 2 Vital Signs Time Taken: 08:46 Temperature (F): 97.7 Height (in): 64 Pulse (bpm): 87 Weight (lbs): 135 Respiratory Rate (breaths/min): 16 Body Mass Index (BMI): 23.2 Blood Pressure (mmHg): 156/77 Reference Range: 80 - 120 mg / dl Electronic Signature(s) Signed: 10/02/2020 4:54:21 PM By: Carlene Coria RN Entered By: Carlene Coria on 09/30/2020 08:48:21

## 2020-10-10 ENCOUNTER — Encounter: Payer: Medicare Other | Admitting: Physician Assistant

## 2020-10-10 ENCOUNTER — Other Ambulatory Visit: Payer: Self-pay

## 2020-10-10 DIAGNOSIS — I890 Lymphedema, not elsewhere classified: Secondary | ICD-10-CM | POA: Diagnosis not present

## 2020-10-10 NOTE — Progress Notes (Addendum)
PAIRLEE, SAWTELL ([id number]) Visit Report for 10/10/2020 Chief Complaint Document Details Patient Name: [name], [name]. Date of Service: 10/10/2020 12:30 PM Medical Record Number: [id number] Patient Account Number: [id number] Date of Birth/Sex: 09-04-1935 (85 y.o. F) Treating RN: Cornell Barman Primary Care Provider: Harrel Lemon Other Clinician: Referring Provider: Harrel Lemon Treating Provider/Extender: Skipper Cliche in Treatment: 3 Information Obtained from: Patient Chief Complaint Right LE Skin Tear Electronic Signature(s) Signed: 10/10/2020 12:47:09 PM By: Worthy Keeler PA-C Entered By: Worthy Keeler on 10/10/2020 70:17:79 [place], [name] ([id number]) -------------------------------------------------------------------------------- HPI Details Patient Name: [name] Date of Service: 10/10/2020 12:30 PM Medical Record Number: [id number] Patient Account Number: [id number] Date of Birth/Sex: [birth year] (85 y.o. F) Treating RN: Cornell Barman Primary Care Provider: Harrel Lemon Other Clinician: Referring Provider: Harrel Lemon Treating Provider/Extender: Skipper Cliche in Treatment: 3 History of Present Illness HPI Description: 85 year old patient who is known to have diabetes mellitus and tobacco abuse has had a injury to the right posterior calf about a month ago. He has been treated with local care and 2 courses of antibiotics which include Keflex and doxycycline which she has completed. Smokes about a pack and a half of cigarettes a day and has a past medical history of COPD, diabetes mellitus, hyperlipidemia,psoriasis and hypothyroidism. Readmission: 04/04/18 patient presents today for a initial evaluation today concerning a right forearm skin tear which has been present for about two weeks. Currently need this point has been applied to the wound bed followed by a protective dressing that has been secured by wrapping instead of adhesive's. Currently the  patient's wound does seem to be doing better patient and her husband states that the excess skin from the skin tear was actually trimmed away prior to starting the wound care they have been performing. With that being said she seems to be doing well there's no evidence of infection which is good news and overall the wound seems to be showing signs of good granulation and overall improvement which is great news. Fortunately she has no with valid bladder dysfunction unexpected fevers chills or weight loss. This seems to be rather straightforward in my opinion in regard to treatment. 04/12/18 on evaluation today patient appears to be doing very well in regard to her right forearm skin tear. She's been tolerating the dressing changes without complication. In general this appears to show signs of great improvement since last time I saw her. Overall I'm very pleased with how things are going patient still somewhat scared about the fact that the skin that is healing over appears very fragile I explained that it does take time for this to really tough enough even once it closes over. Fortunately there is no evidence of infection. 04/19/18 on evaluation today patient appears to be doing very well in regard to her right forearm ulcer. This actually appears to be completely healed at this point which is good news. There does not appear to be any evidence of infection currently. She still has a little bit of discomfort due to some underlying irritation but overall she is doing excellent. Readmission: 02/14/19 on evaluation today patient actually appears to be doing very well all things considering in regard to her right shin ulcer which occurred as a result of a picture frame dropping onto the leg last Thursday. This is not been quite a week as of today. Because we couldn't get him today this happened she actually did go to urgent care was she with this guy Keflex for  week and also a pressure dressing was applied.  this has caused some issues with fluid buildup below and above the dressing unfortunately. Fortunately there does not appear to be signs of infection at this time which is excellent news No fevers, chills, nausea, or vomiting noted at this time. 02/21/19 on evaluation today patient actually appears to be doing much better at this point in regard to her right lower Trinity ulcer. She's been tolerating the dressing changes without complication. Fortunately there's no evidence of infection at this time. No fevers, chills, nausea, or vomiting noted at this time. 02/28/19 on evaluation today patient actually appears to be doing very well in regard to her lower extremity ulcer. She has been tolerating the dressing changes without complication. Fortunately this seems to be doing significantly better which is great news. Overall I'm very pleased with the progress. I do believe that the patient is doing excellent overall from the standpoint of her wound although there was some dressing stuck to the surface of the wound that did require sharp debridement today. Readmission: 09/13/2020 upon evaluation today patient appears to be doing well all things considered in regard to her right leg skin tear. She has been putting some Vaseline on this nothing else at this point. Fortunately there is no signs of active infection which is great news this happened she tells me about 4 days ago. She decided to come in and have me take a look at it to ensure nothing else was going on. She does not even remember how exactly she did this other than she had some burning/stinging on her leg and she went to rub it causing this to open. 10/6; follow-up with regards to her right lower extremity anterior skin tear. We put Steri-Strips on this last week. The skin around this especially medially is still viable which is good news. The wound is not totally opposed 10/13; 2-week follow-up. Patient has a right leg skin tear. I gather the  skin did not totally oppose however she has a very healthy small wound on the right anterior lower leg. We have been using Xeroform and conform with net 09/30/2020 upon evaluation today patient appears to actually be doing quite well in regard to her leg ulcer which in fact appears to be very close to complete resolution and healing. I am extremely pleased with where things stand and overall I think she will likely be healed in the next 1-2 weeks. 10/10/2020 on evaluation today patient appears to be doing excellent in fact her wound appears to be completely healed which is great news. There is no signs of active infection at this time. Electronic Signature(s) Signed: 10/10/2020 12:56:28 PM By: Worthy Keeler PA-C Entered By: Worthy Keeler on 10/10/2020 17:49:44 [place], Allyne Jenetta Downer ([id number], VERSTEEG ([id number]) -------------------------------------------------------------------------------- Physical Exam Details Patient Name: [name], [name] Date of Service: 10/10/2020 12:30 PM Medical Record Number: [id number] Patient Account Number: [id number] Date of Birth/Sex: Mar 20, 1935 (85 y.o. F) Treating RN: Cornell Barman Primary Care Provider: Harrel Lemon Other Clinician: Referring Provider: Harrel Lemon Treating Provider/Extender: Jeri Cos Weeks in Treatment: 3 Constitutional Well-nourished and well-hydrated in no acute distress. Respiratory normal breathing without difficulty. Psychiatric this patient is able to make decisions and demonstrates good insight into disease process. Alert and Oriented x 3. pleasant and cooperative. Notes His wound bed showed signs of good epithelization at this point there does not appear to be evidence of active infection in fact she has completely closed this and overall I  am extremely pleased in that regard. Electronic Signature(s) Signed: 10/10/2020 12:56:49 PM By: Worthy Keeler PA-C Entered By: Worthy Keeler on 10/10/2020  62:03:55 [place], [name] ([id number]) -------------------------------------------------------------------------------- Physician Orders Details Patient Name: [name] Date of Service: 10/10/2020 12:30 PM Medical Record Number: [id number] Patient Account Number: [id number] Date of Birth/Sex: 1935-04-08 (85 y.o. F) Treating RN: Dolan Amen Primary Care Provider: Harrel Lemon Other Clinician: Referring Provider: Harrel Lemon Treating Provider/Extender: Skipper Cliche in Treatment: 3 Verbal / Phone Orders: No Diagnosis Coding ICD-10 Coding Code Description I89.0 Lymphedema, not elsewhere classified S81.801A Unspecified open wound, right lower leg, initial encounter Primary Wound Dressing o Other: - ABD pad secured with stretch net Discharge From [facility] o Discharge from [place] - treatment complete Electronic Signature(s) Signed: 10/10/2020 5:02:21 PM By: Worthy Keeler PA-C Signed: 10/15/2020 8:59:48 AM By: Georges Mouse, Minus Breeding Entered By: Georges Mouse, Minus Breeding on 10/10/2020 22:48:25 [place], [name] ([id number]) -------------------------------------------------------------------------------- Problem List Details Patient Name: [name], [name]. Date of Service: 10/10/2020 12:30 PM Medical Record Number: [id number] Patient Account Number: [id number] Date of Birth/Sex: [birth year] (85 y.o. F) Treating RN: Cornell Barman Primary Care Provider: Harrel Lemon Other Clinician: Referring Provider: Harrel Lemon Treating Provider/Extender: Skipper Cliche in Treatment: 3 Active Problems ICD-10 Encounter Code Description Active Date MDM Diagnosis I89.0 Lymphedema, not elsewhere classified 09/13/2020 No Yes S81.801A Unspecified open wound, right lower leg, initial encounter 09/13/2020 No Yes Inactive Problems Resolved Problems Electronic Signature(s) Signed: 10/10/2020 12:46:56 PM By: Worthy Keeler PA-C Entered By: Worthy Keeler on 10/10/2020  88:28:00 [name], [name] ([id number]) -------------------------------------------------------------------------------- Progress Note Details Patient Name: [name], [name] Date of Service: 10/10/2020 12:30 PM Medical Record Number: [id number] Patient Account Number: [id number] Date of Birth/Sex: October 01, 1935 (85 y.o. F) Treating RN: Cornell Barman Primary Care Provider: Harrel Lemon Other Clinician: Referring Provider: Harrel Lemon Treating Provider/Extender: Skipper Cliche in Treatment: 3 Subjective Chief Complaint Information obtained from Patient Right LE Skin Tear History of Present Illness (HPI) 85 year old patient who is known to have diabetes mellitus and tobacco abuse has had a injury to the right posterior calf about a month ago. He has been treated with local care and 2 courses of antibiotics which include Keflex and doxycycline which she has completed. Smokes about a pack and a half of cigarettes a day and has a past medical history of COPD, diabetes mellitus, hyperlipidemia,psoriasis and hypothyroidism. Readmission: 04/04/18 patient presents today for a initial evaluation today concerning a right forearm skin tear which has been present for about two weeks. Currently need this point has been applied to the wound bed followed by a protective dressing that has been secured by wrapping instead of adhesive's. Currently the patient's wound does seem to be doing better patient and her husband states that the excess skin from the skin tear was actually trimmed away prior to starting the wound care they have been performing. With that being said she seems to be doing well there's no evidence of infection which is good news and overall the wound seems to be showing signs of good granulation and overall improvement which is great news. Fortunately she has no with valid bladder dysfunction unexpected fevers chills or weight loss. This seems to be rather straightforward in my opinion in  regard to treatment. 04/12/18 on evaluation today patient appears to be doing very well in regard to her right forearm skin tear. She's been tolerating the dressing changes without complication. In general this appears to show signs  of great improvement since last time I saw her. Overall I'm very pleased with how things are going patient still somewhat scared about the fact that the skin that is healing over appears very fragile I explained that it does take time for this to really tough enough even once it closes over. Fortunately there is no evidence of infection. 04/19/18 on evaluation today patient appears to be doing very well in regard to her right forearm ulcer. This actually appears to be completely healed at this point which is good news. There does not appear to be any evidence of infection currently. She still has a little bit of discomfort due to some underlying irritation but overall she is doing excellent. Readmission: 02/14/19 on evaluation today patient actually appears to be doing very well all things considering in regard to her right shin ulcer which occurred as a result of a picture frame dropping onto the leg last Thursday. This is not been quite a week as of today. Because we couldn't get him today this happened she actually did go to urgent care was she with this guy Keflex for week and also a pressure dressing was applied. this has caused some issues with fluid buildup below and above the dressing unfortunately. Fortunately there does not appear to be signs of infection at this time which is excellent news No fevers, chills, nausea, or vomiting noted at this time. 02/21/19 on evaluation today patient actually appears to be doing much better at this point in regard to her right lower Trinity ulcer. She's been tolerating the dressing changes without complication. Fortunately there's no evidence of infection at this time. No fevers, chills, nausea, or vomiting noted at this  time. 02/28/19 on evaluation today patient actually appears to be doing very well in regard to her lower extremity ulcer. She has been tolerating the dressing changes without complication. Fortunately this seems to be doing significantly better which is great news. Overall I'm very pleased with the progress. I do believe that the patient is doing excellent overall from the standpoint of her wound although there was some dressing stuck to the surface of the wound that did require sharp debridement today. Readmission: 09/13/2020 upon evaluation today patient appears to be doing well all things considered in regard to her right leg skin tear. She has been putting some Vaseline on this nothing else at this point. Fortunately there is no signs of active infection which is great news this happened she tells me about 4 days ago. She decided to come in and have me take a look at it to ensure nothing else was going on. She does not even remember how exactly she did this other than she had some burning/stinging on her leg and she went to rub it causing this to open. 10/6; follow-up with regards to her right lower extremity anterior skin tear. We put Steri-Strips on this last week. The skin around this especially medially is still viable which is good news. The wound is not totally opposed 10/13; 2-week follow-up. Patient has a right leg skin tear. I gather the skin did not totally oppose however she has a very healthy small wound on the right anterior lower leg. We have been using Xeroform and conform with net 09/30/2020 upon evaluation today patient appears to actually be doing quite well in regard to her leg ulcer which in fact appears to be very close to complete resolution and healing. I am extremely pleased with where things stand and overall I think  she will likely be healed in the next 1-2 weeks. 10/10/2020 on evaluation today patient appears to be doing excellent in fact her wound appears to be  completely healed which is great news. There is no signs of active infection at this time. [name], [name] ([id number]) Objective Constitutional Well-nourished and well-hydrated in no acute distress. Vitals Time Taken: 12:42 PM, Height: 64 in, Weight: 135 lbs, BMI: 23.2, Temperature: 98.3 F, Pulse: 91 bpm, Respiratory Rate: 20 breaths/min, Blood Pressure: 147/75 mmHg. Respiratory normal breathing without difficulty. Psychiatric this patient is able to make decisions and demonstrates good insight into disease process. Alert and Oriented x 3. pleasant and cooperative. General Notes: His wound bed showed signs of good epithelization at this point there does not appear to be evidence of active infection in fact she has completely closed this and overall I am extremely pleased in that regard. Integumentary (Hair, Skin) Wound #4 status is Healed - Epithelialized. Original cause of wound was Gradually Appeared. The wound is located on the Right,Distal,Anterior Lower Leg. The wound measures 0cm length x 0cm width x 0cm depth; 0cm^2 area and 0cm^3 volume. There is no tunneling or undermining noted. There is a none present amount of drainage noted. There is no granulation within the wound bed. There is no necrotic tissue within the wound bed. Assessment Active Problems ICD-10 Lymphedema, not elsewhere classified Unspecified open wound, right lower leg, initial encounter Plan Primary Wound Dressing: Other: - ABD pad secured with stretch net Discharge From [facility] Services: Discharge from [place] - treatment complete 1. I would recommend at this time that we go ahead and continue with the protective dressing just using an ABD pad and stretch net to hold in place so that we can hopefully keep the area protected being that this is new skin we do not want it to be forced open again. 2. I would recommend that the patient consider some of the protective foam padding that I had given her from  [facility] last time she looked at those but did not get any right now. I think that could be beneficial since she has this happen frequently on her arms and legs. She is going to consider this. We will see her back as needed. Electronic Signature(s) Signed: 10/10/2020 12:57:30 PM By: Worthy Keeler PA-C Entered By: Worthy Keeler on 10/10/2020 22:29:79 [place], [name] ([id number]) -------------------------------------------------------------------------------- SuperBill Details Patient Name: [name] Date of Service: 10/10/2020 Medical Record Number: [id number] Patient Account Number: [id number] Date of Birth/Sex: 19-Apr-1935 (85 y.o. F) Treating RN: Cornell Barman Primary Care Provider: Harrel Lemon Other Clinician: Referring Provider: Harrel Lemon Treating Provider/Extender: Skipper Cliche in Treatment: 3 Diagnosis Coding ICD-10 Codes Code Description I89.0 Lymphedema, not elsewhere classified S81.801A Unspecified open wound, right lower leg, initial encounter Facility Procedures CPT4 Code: [id number] Description: [phone] - WOUND CARE VISIT-LEV 2 EST PT Modifier: Quantity: 1 Physician Procedures CPT4 Code: [id number] Description: [id number] - WC PHYS LEVEL 3 - EST PT Modifier: Quantity: 1 CPT4 Code: Description: ICD-10 Diagnosis Description I89.0 Lymphedema, not elsewhere classified S81.801A Unspecified open wound, right lower leg, initial encounter Modifier: Quantity: Electronic Signature(s) Signed: 10/10/2020 12:57:42 PM By: Worthy Keeler PA-C Entered By: Worthy Keeler on 10/10/2020 12:57:41

## 2020-10-15 NOTE — Progress Notes (Signed)
MERIDA, ALCANTAR ([id number]) Visit Report for 10/10/2020 Arrival Information Details Patient Name: [name], [name]. Date of Service: 10/10/2020 12:30 PM Medical Record Number: [id number] Patient Account Number: [id number] Date of Birth/Sex: [birth year] (85 y.o. F) Treating RN: Dolan Amen Primary Care Cynthis Purington: Harrel Lemon Other Clinician: Referring Mariana Wiederholt: Harrel Lemon Treating Danial Sisley/Extender: Skipper Cliche in Treatment: 3 Visit Information History Since Last Visit Pain Present Now: No Patient Arrived: Ambulatory Arrival Time: 12:41 Accompanied By: self Transfer Assistance: None Patient Identification Verified: Yes Secondary Verification Process Completed: Yes Patient Requires Transmission-Based Precautions: No Patient Has Alerts: No Electronic Signature(s) Signed: 10/15/2020 8:59:48 AM By: Georges Mouse, Minus Breeding Entered By: Georges Mouse, Minus Breeding on 10/10/2020 33:29:51 [name], [name] ([id number]) -------------------------------------------------------------------------------- Clinic Level of Care Assessment Details Patient Name: Mattes, [name] Date of Service: 10/10/2020 12:30 PM Medical Record Number: [id number] Patient Account Number: [id number] Date of Birth/Sex: [birth year] (85 y.o. F) Treating RN: Dolan Amen Primary Care Jin Capote: Harrel Lemon Other Clinician: Referring Kalynne Womac: Harrel Lemon Treating Demaris Leavell/Extender: Skipper Cliche in Treatment: 3 Clinic Level of Care Assessment Items TOOL 4 Quantity Score []  - Use when only an EandM is performed on FOLLOW-UP visit 0 ASSESSMENTS - Nursing Assessment / Reassessment X - Reassessment of Co-morbidities (includes updates in patient status) 1 10 X- 1 5 Reassessment of Adherence to Treatment Plan ASSESSMENTS - Wound and Skin Assessment / Reassessment X - Simple Wound Assessment / Reassessment - one wound 1 5 []  - 0 Complex Wound Assessment / Reassessment - multiple wounds []  - 0 Dermatologic  / Skin Assessment (not related to wound area) ASSESSMENTS - Focused Assessment []  - Circumferential Edema Measurements - multi extremities 0 []  - 0 Nutritional Assessment / Counseling / Intervention []  - 0 Lower Extremity Assessment (monofilament, tuning fork, pulses) []  - 0 Peripheral Arterial Disease Assessment (using hand held doppler) ASSESSMENTS - Ostomy and/or Continence Assessment and Care []  - Incontinence Assessment and Management 0 []  - 0 Ostomy Care Assessment and Management (repouching, etc.) PROCESS - Coordination of Care X - Simple Patient / Family Education for ongoing care 1 15 []  - 0 Complex (extensive) Patient / Family Education for ongoing care []  - 0 Staff obtains Programmer, systems, Records, Test Results / Process Orders []  - 0 Staff telephones HHA, Nursing Homes / Clarify orders / etc []  - 0 Routine Transfer to another Facility (non-emergent condition) []  - 0 Routine Hospital Admission (non-emergent condition) []  - 0 New Admissions / Biomedical engineer / Ordering NPWT, Apligraf, etc. []  - 0 Emergency Hospital Admission (emergent condition) X- 1 10 Simple Discharge Coordination []  - 0 Complex (extensive) Discharge Coordination PROCESS - Special Needs []  - Pediatric / Minor Patient Management 0 []  - 0 Isolation Patient Management []  - 0 Hearing / Language / Visual special needs []  - 0 Assessment of Community assistance (transportation, D/C planning, etc.) []  - 0 Additional assistance / Altered mentation []  - 0 Support Surface(s) Assessment (bed, cushion, seat, etc.) INTERVENTIONS - Wound Cleansing / Measurement [name], [name] ([id number]) X- 1 5 Simple Wound Cleansing - one wound []  - 0 Complex Wound Cleansing - multiple wounds X- 1 5 Wound Imaging (photographs - any number of wounds) []  - 0 Wound Tracing (instead of photographs) X- 1 5 Simple Wound Measurement - one wound []  - 0 Complex Wound Measurement - multiple wounds INTERVENTIONS -  Wound Dressings X - Small Wound Dressing one or multiple wounds 1 10 []  - 0 Medium Wound Dressing one or multiple wounds []  - 0 Large Wound Dressing  one or multiple wounds []  - 0 Application of Medications - topical []  - 0 Application of Medications - injection INTERVENTIONS - Miscellaneous []  - External ear exam 0 []  - 0 Specimen Collection (cultures, biopsies, blood, body fluids, etc.) []  - 0 Specimen(s) / Culture(s) sent or taken to Lab for analysis []  - 0 Patient Transfer (multiple staff / Civil Service fast streamer / Similar devices) []  - 0 Simple Staple / Suture removal (25 or less) []  - 0 Complex Staple / Suture removal (26 or more) []  - 0 Hypo / Hyperglycemic Management (close monitor of Blood Glucose) []  - 0 Ankle / Brachial Index (ABI) - do not check if billed separately X- 1 5 Vital Signs Has the patient been seen at the hospital within the last three years: Yes Total Score: 75 Level Of Care: New/Established - Level 2 Electronic Signature(s) Signed: 10/15/2020 8:59:48 AM By: Georges Mouse, Minus Breeding Entered By: Georges Mouse, Minus Breeding on 10/10/2020 16:10:96 [name], [name] ([id number]) -------------------------------------------------------------------------------- Encounter Discharge Information Details Patient Name: [name]. Date of Service: 10/10/2020 12:30 PM Medical Record Number: [id number] Patient Account Number: [id number] Date of Birth/Sex: 17-Apr-1935 (85 y.o. F) Treating RN: Dolan Amen Primary Care Akanksha Bellmore: Harrel Lemon Other Clinician: Referring Khaleef Ruby: Harrel Lemon Treating Grisela Mesch/Extender: Skipper Cliche in Treatment: 3 Encounter Discharge Information Items Discharge Condition: Stable Ambulatory Status: Ambulatory Discharge Destination: Home Transportation: Private Auto Accompanied By: self Schedule Follow-up Appointment: Yes Clinical Summary of Care: Electronic Signature(s) Signed: 10/15/2020 8:59:48 AM By: Georges Mouse,  Minus Breeding Entered By: Georges Mouse, Minus Breeding on 10/10/2020 21:30:86 [name], [name] ([id number]) -------------------------------------------------------------------------------- Lower Extremity Assessment Details Patient Name: [name], [name]. Date of Service: 10/10/2020 12:30 PM Medical Record Number: [id number] Patient Account Number: [id number] Date of Birth/Sex: 23-Jan-1935 (85 y.o. F) Treating RN: Dolan Amen Primary Care Talbert Trembath: Harrel Lemon Other Clinician: Referring Vianey Caniglia: Harrel Lemon Treating Jaryn Hocutt/Extender: Jeri Cos Weeks in Treatment: 3 Vascular Assessment Pulses: Dorsalis Pedis Palpable: [Right:Yes] Electronic Signature(s) Signed: 10/15/2020 8:59:48 AM By: Georges Mouse, Minus Breeding Entered By: Georges Mouse, Minus Breeding on 10/10/2020 01:02:72 [name], [name] ([id number]) -------------------------------------------------------------------------------- Multi Wound Chart Details Patient Name: [name], [name]. Date of Service: 10/10/2020 12:30 PM Medical Record Number: [id number] Patient Account Number: [id number] Date of Birth/Sex: Jan 28, 1935 (85 y.o. F) Treating RN: Dolan Amen Primary Care Breklyn Fabrizio: Harrel Lemon Other Clinician: Referring Adalind Weitz: Harrel Lemon Treating Deissy Guilbert/Extender: Skipper Cliche in Treatment: 3 Vital Signs Height(in): 64 Pulse(bpm): 47 Weight(lbs): 135 Blood Pressure(mmHg): 147/75 Body Mass Index(BMI): 23 Temperature(F): 98.3 Respiratory Rate(breaths/min): 20 Photos: [N/A:N/A] Wound Location: Right, Distal, Anterior Lower Leg N/A N/A Wounding Event: Gradually Appeared N/A N/A Primary Etiology: Skin Tear N/A N/A Comorbid History: Cataracts, Anemia, Chronic N/A N/A Obstructive Pulmonary Disease (COPD), Type II Diabetes, Received Radiation Date Acquired: 09/09/2020 N/A N/A Weeks of Treatment: 3 N/A N/A Wound Status: Open N/A N/A Measurements L x W x D (cm) 0.1x0.1x0.1 N/A N/A Area (cm) : 0.008 N/A N/A Volume (cm) :  0.001 N/A N/A % Reduction in Area: 99.20% N/A N/A % Reduction in Volume: 99.00% N/A N/A Classification: Full Thickness Without Exposed N/A N/A Support Structures Exudate Amount: None Present N/A N/A Granulation Amount: None Present (0%) N/A N/A Necrotic Amount: None Present (0%) N/A N/A Exposed Structures: Fascia: No N/A N/A Fat Layer (Subcutaneous Tissue): No Tendon: No Muscle: No Joint: No Bone: No Epithelialization: Large (67-100%) N/A N/A Treatment Notes Electronic Signature(s) Signed: 10/15/2020 8:59:48 AM By: Georges Mouse, Minus Breeding Entered By: Georges Mouse, Minus Breeding on 10/10/2020 75:64:33 [place], [name] ([id number]) -------------------------------------------------------------------------------- [place]  Details Patient Name: [name], [name]. Date of Service: 10/10/2020 12:30 PM Medical Record Number: [id number] Patient Account Number: [id number] Date of Birth/Sex: 1935-06-13 (85 y.o. F) Treating RN: Dolan Amen Primary Care Djimon Lundstrom: Harrel Lemon Other Clinician: Referring Ozias Dicenzo: Harrel Lemon Treating Nakiea Metzner/Extender: Jeri Cos Weeks in Treatment: 3 Active Inactive Electronic Signature(s) Signed: 10/15/2020 8:59:48 AM By: Georges Mouse, Minus Breeding Entered By: Georges Mouse, Minus Breeding on 10/10/2020 46:80:32 [name], [name] ([id number]) -------------------------------------------------------------------------------- Pain Assessment Details Patient Name: [name] Date of Service: 10/10/2020 12:30 PM Medical Record Number: [id number] Patient Account Number: [id number] Date of Birth/Sex: Sep 20, 1935 (85 y.o. F) Treating RN: Dolan Amen Primary Care Amaira Safley: Harrel Lemon Other Clinician: Referring Oris Staffieri: Harrel Lemon Treating Kirby Cortese/Extender: Skipper Cliche in Treatment: 3 Active Problems Location of Pain Severity and Description of Pain Patient Has Paino No Site Locations Rate the pain. Current Pain Level: 0 Pain  Management and Medication Current Pain Management: Electronic Signature(s) Signed: 10/15/2020 8:59:48 AM By: Georges Mouse, Kenia Entered By: Georges Mouse, Kenia on 10/10/2020 69:45:03 Verley, [name] ([id number]) -------------------------------------------------------------------------------- Wound Assessment Details Patient Name: [name], [name] Date of Service: 10/10/2020 12:30 PM Medical Record Number: [id number] Patient Account Number: [id number] Date of Birth/Sex: 1935-02-20 (85 y.o. F) Treating RN: Dolan Amen Primary Care Samyrah Bruster: Harrel Lemon Other Clinician: Referring Pranika Finks: Harrel Lemon Treating Karlen Barbar/Extender: Jeri Cos Weeks in Treatment: 3 Wound Status Wound Number: 4 Primary Skin Tear Etiology: Wound Location: Right, Distal, Anterior Lower Leg Wound Healed - Epithelialized Wounding Event: Gradually Appeared Status: Date Acquired: 09/09/2020 Notes: Patient states she got up in the night about 4 days ago and Weeks Of Treatment: 3 felt her lower right leg was wet. States it was weeping clear Clustered Wound: No fluid. States her skin opened up after that. Area looks like a skin tear. Patient denies hitting her leg on anything. Comorbid Cataracts, Anemia, Chronic Obstructive Pulmonary Disease History: (COPD), Type II Diabetes, Received Radiation Photos Wound Measurements Length: (cm) 0 Width: (cm) 0 Depth: (cm) 0 Area: (cm) 0 Volume: (cm) 0 % Reduction in Area: 100% % Reduction in Volume: 100% Epithelialization: Large (67-100%) Tunneling: No Undermining: No Wound Description Classification: Full Thickness Without Exposed Support Structures Exudate Amount: None Present Foul Odor After Cleansing: No Slough/Fibrino No Wound Bed Granulation Amount: None Present (0%) Exposed Structure Necrotic Amount: None Present (0%) Fascia Exposed: No Fat Layer (Subcutaneous Tissue) Exposed: No Tendon Exposed: No Muscle Exposed: No Joint Exposed:  No Bone Exposed: No Electronic Signature(s) Signed: 10/15/2020 8:59:48 AM By: Georges Mouse, Minus Breeding Entered By: Georges Mouse, Minus Breeding on 10/10/2020 97:94:80 Burget, [name] ([id number]) -------------------------------------------------------------------------------- Vitals Details Patient Name: [name], [name]. Date of Service: 10/10/2020 12:30 PM Medical Record Number: [id number] Patient Account Number: [id number] Date of Birth/Sex: [birth year] (85 y.o. F) Treating RN: Dolan Amen Primary Care Icess Bertoni: Harrel Lemon Other Clinician: Referring Thaddaeus Granja: Harrel Lemon Treating Aldrin Engelhard/Extender: Skipper Cliche in Treatment: 3 Vital Signs Time Taken: 12:42 Temperature (F): 98.3 Height (in): 64 Pulse (bpm): 91 Weight (lbs): 135 Respiratory Rate (breaths/min): 20 Body Mass Index (BMI): 23.2 Blood Pressure (mmHg): 147/75 Reference Range: 80 - 120 mg / dl Electronic Signature(s) Signed: 10/15/2020 8:59:48 AM By: Georges Mouse, Minus Breeding Entered By: Georges Mouse, Minus Breeding on 10/10/2020 12:45:08

## 2020-11-09 ENCOUNTER — Other Ambulatory Visit: Payer: Self-pay | Admitting: Dermatology

## 2020-12-04 IMAGING — DX DG CHEST 1V PORT
1 series · 1 of 1 positions shown · non-contrast
Comparison: None.

CLINICAL DATA: 83-year-old female with sepsis.

EXAM:
PORTABLE CHEST 1 VIEW

[chest ap]
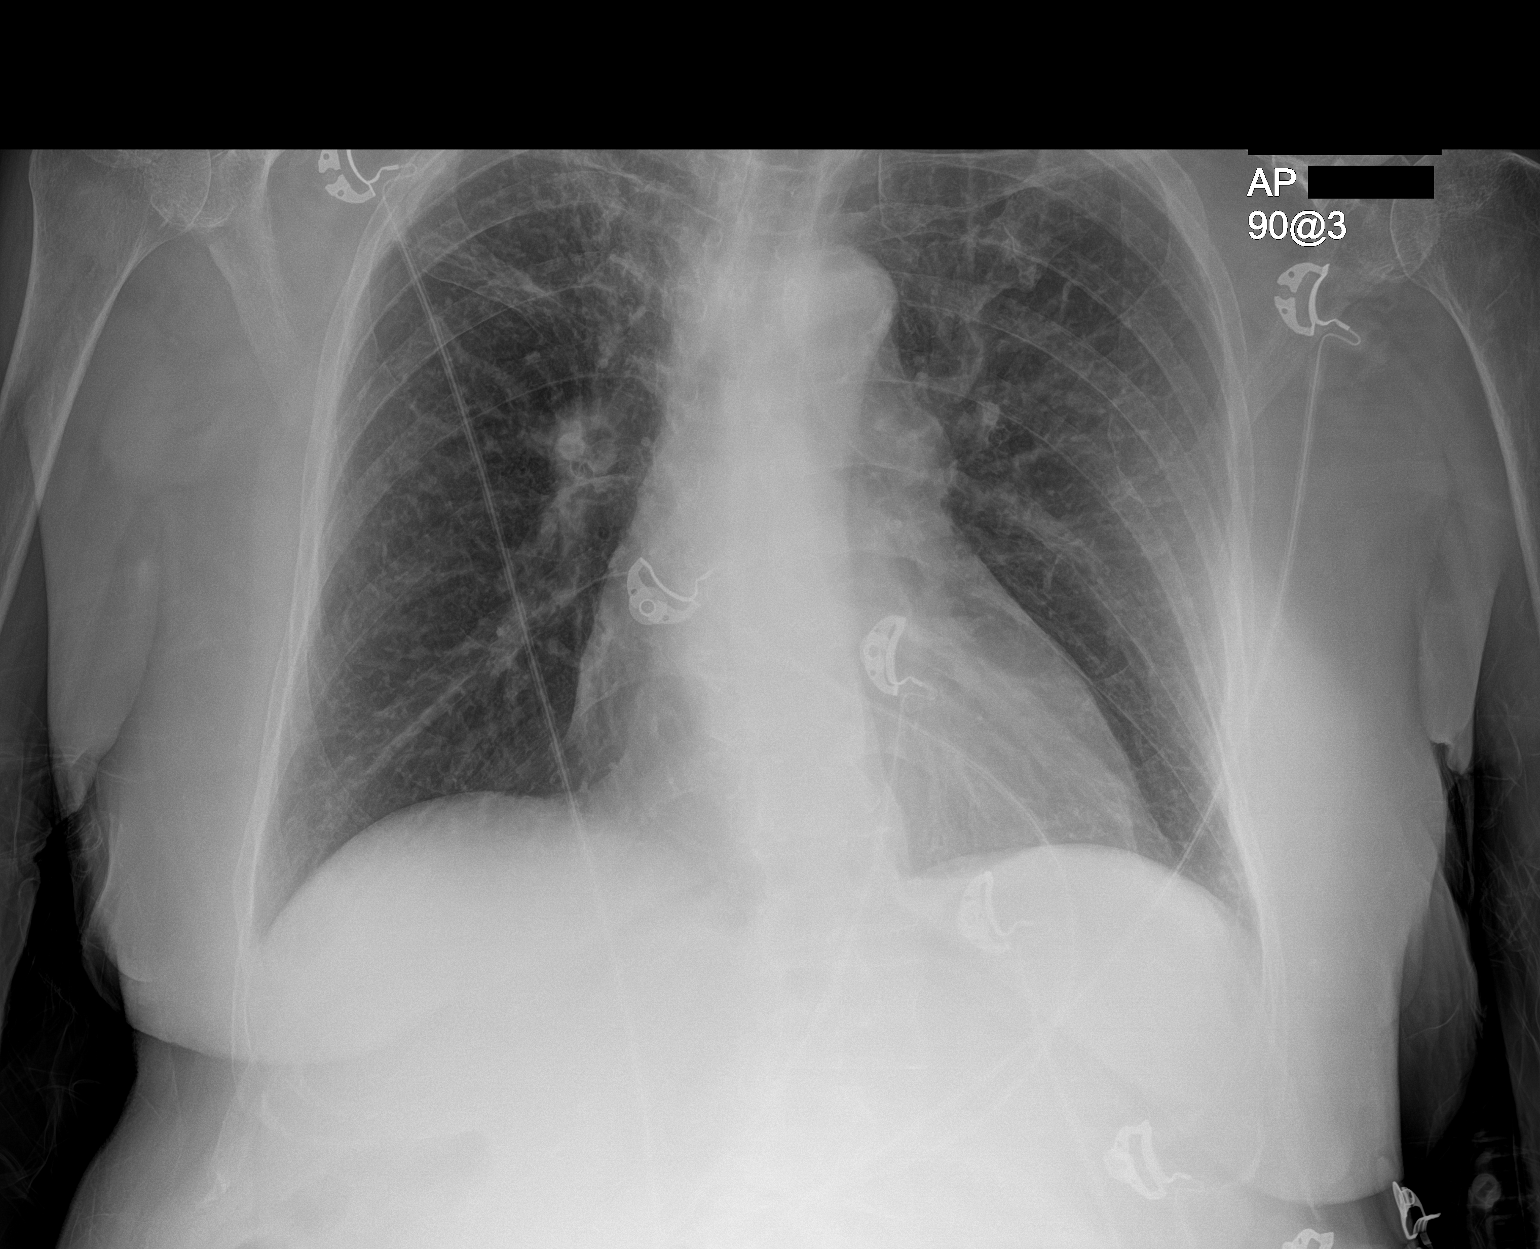

[1 of 1 positions shown; findings below may reference images not displayed]

FINDINGS: Mild diffuse interstitial coarsening and chronic bronchitic changes.
No focal consolidation, pleural effusion, pneumothorax. The cardiac
silhouette is within normal limits. No acute osseous pathology.
IMPRESSION: No active disease.

## 2020-12-05 IMAGING — CT CT RENAL STONE PROTOCOL
2 of 4 series · 16 of 46 positions shown, 18 images · non-contrast
Comparison: 01/27/2016

CLINICAL DATA: Left flank pain. Urinary tract infection.
Leukocytosis, fever, and chills.

EXAM:
CT ABDOMEN AND PELVIS WITHOUT CONTRAST
TECHNIQUE: Multidetector CT imaging of the abdomen and pelvis was performed
following the standard protocol without IV contrast.

[Series 2: stone full standard · axial · 0.76mm/px · z∈[-502,-97]mm · 13 of 89 slices shown, 15 images]
[im 4/89  soft-tissue]
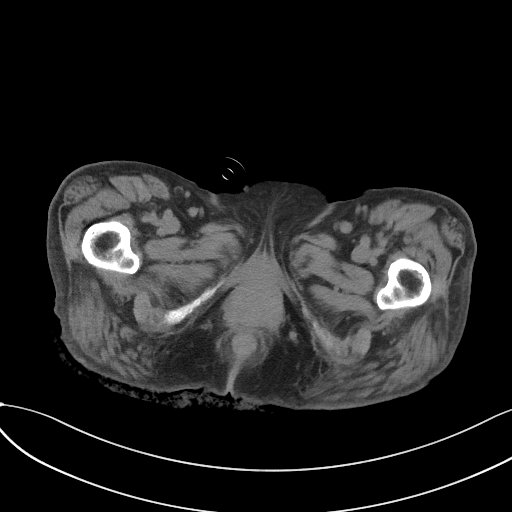
[im 4/89  bone]
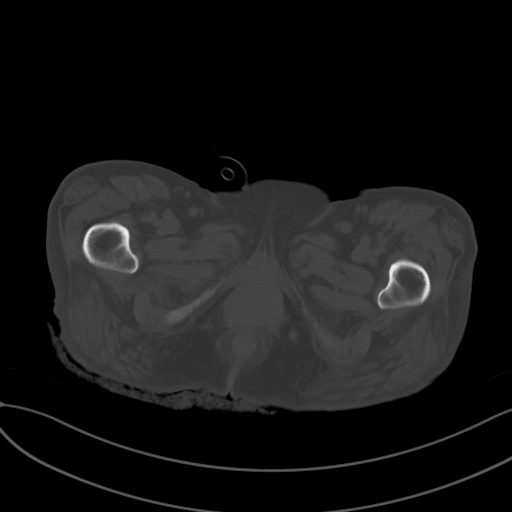
[im 11/89  soft-tissue]
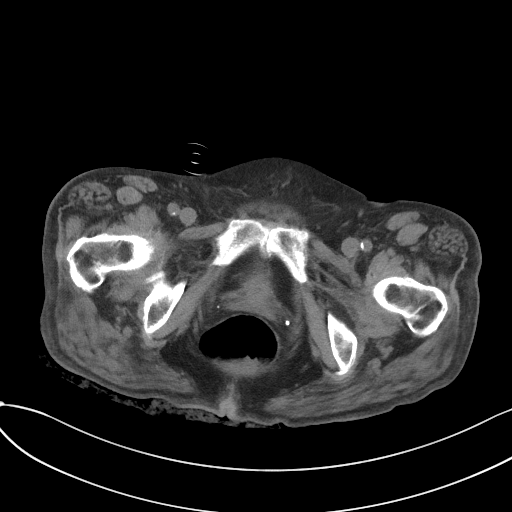
[im 18/89  soft-tissue]
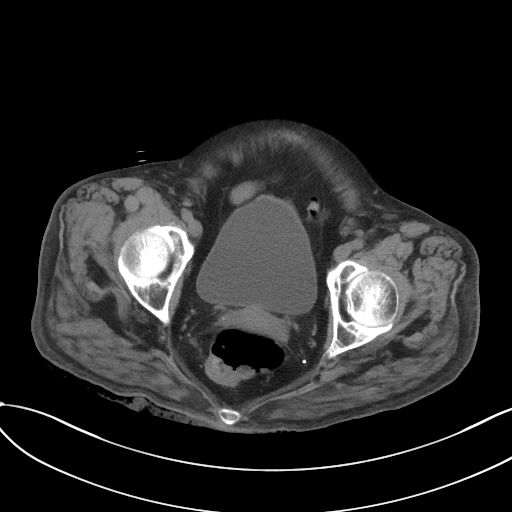
[im 25/89  soft-tissue]
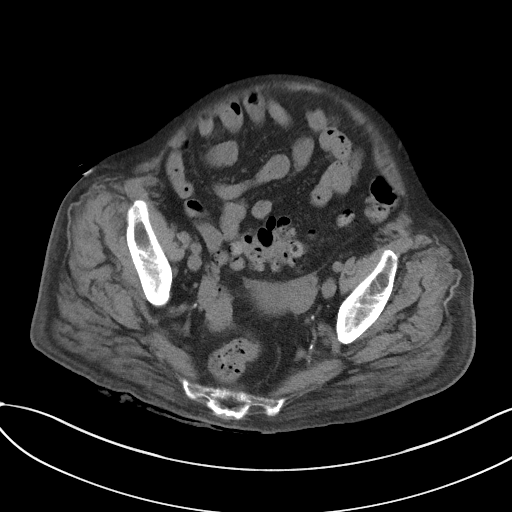
[im 32/89  soft-tissue]
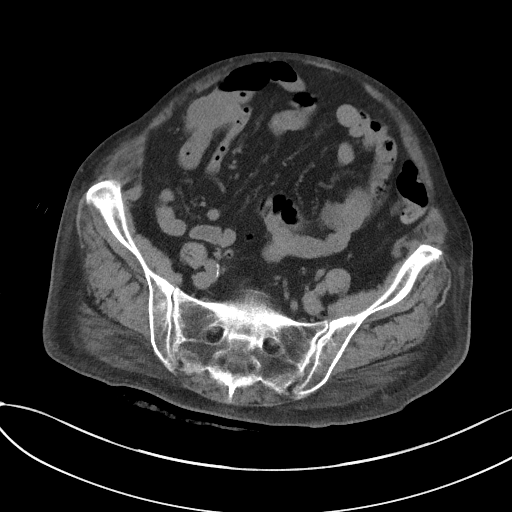
[im 39/89  soft-tissue]
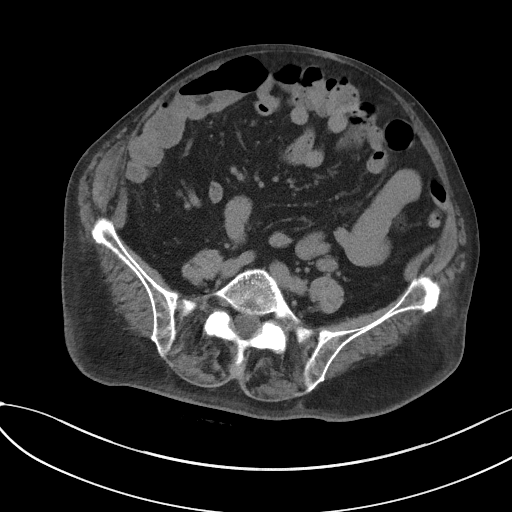
[im 46/89  soft-tissue]
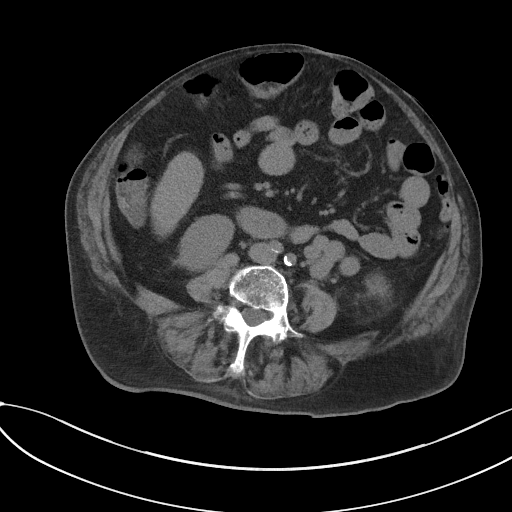
[im 50/89  soft-tissue]
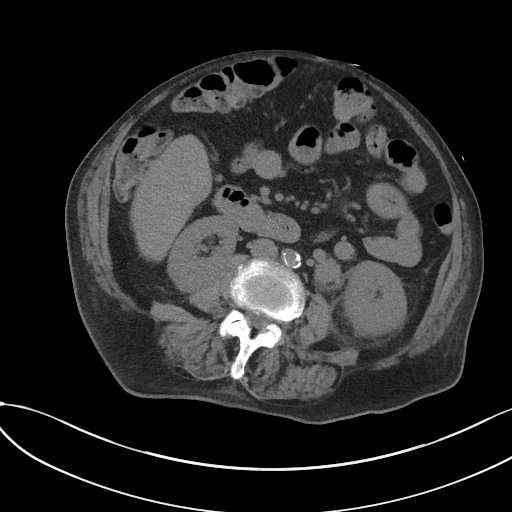
[im 57/89  soft-tissue]
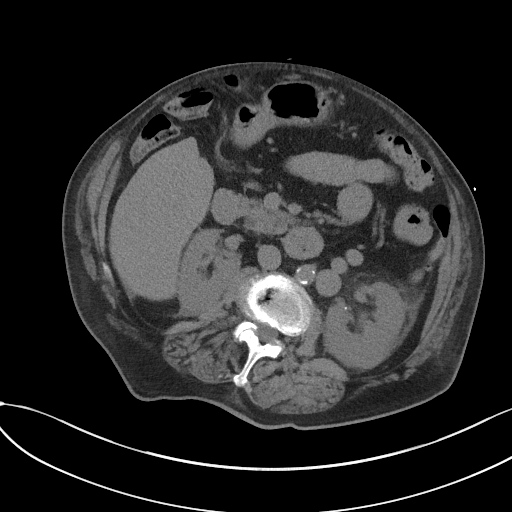
[im 57/89  bone]
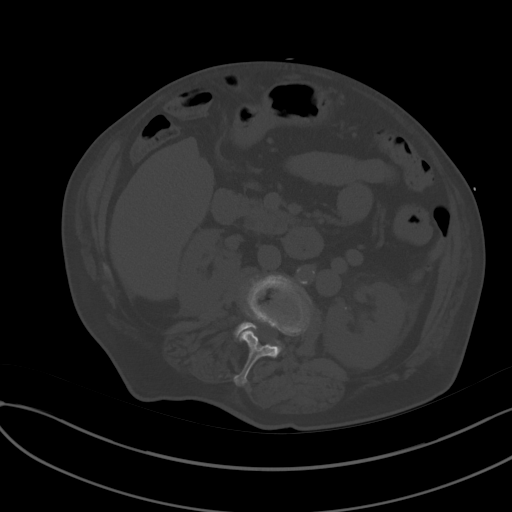
[im 64/89  soft-tissue]
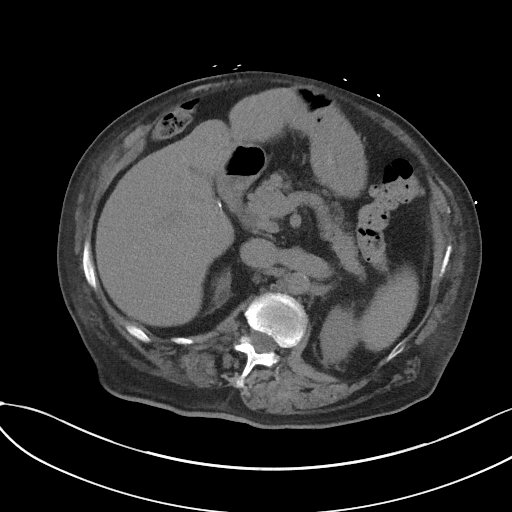
[im 71/89  soft-tissue]
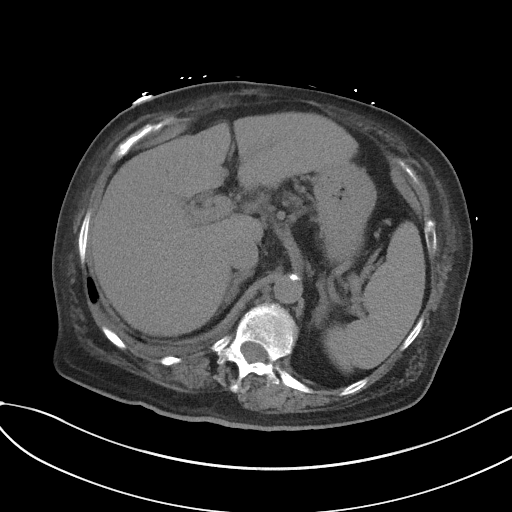
[im 78/89  soft-tissue]
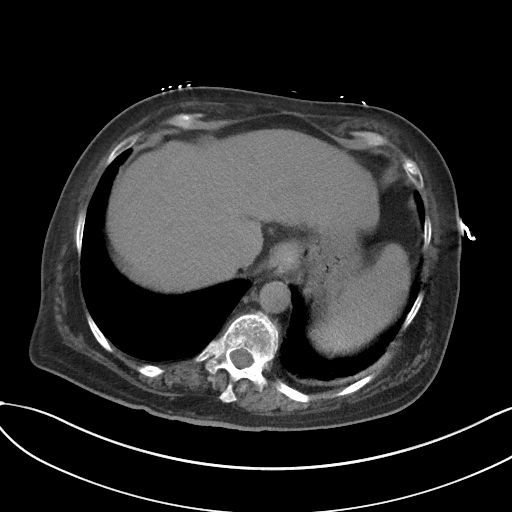
[im 85/89  soft-tissue]
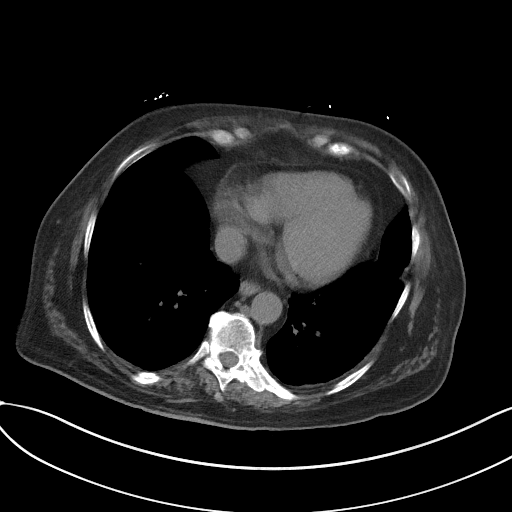

[Series 4: coronal · coronal · 0.72mm/px · 3 of 152 slices shown]
[im 51/152  soft-tissue]
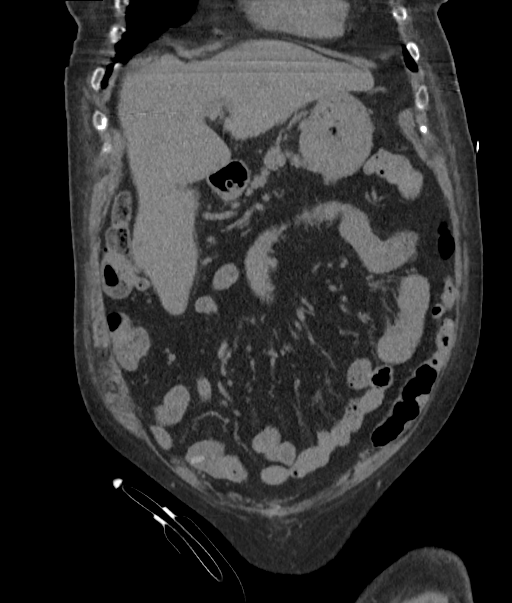
[im 68/152  soft-tissue]
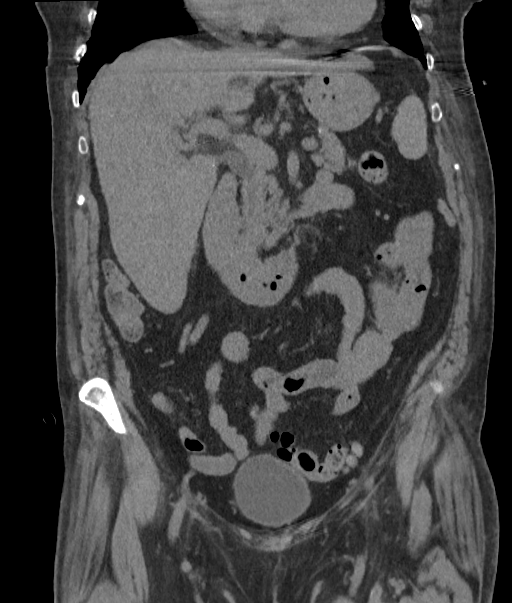
[im 84/152  soft-tissue]
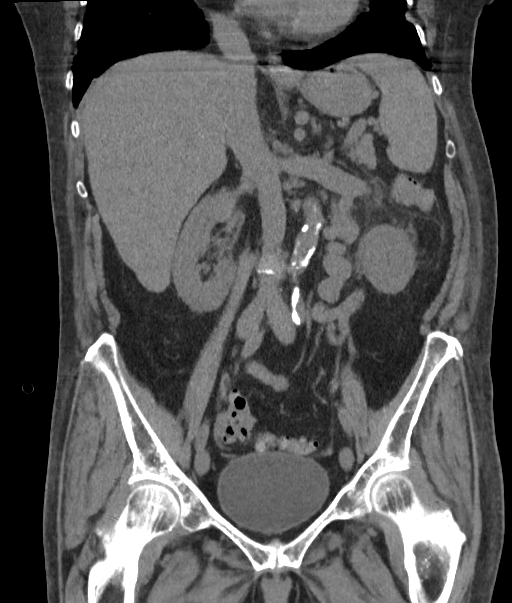

[16 of 46 positions shown; findings below may reference images not displayed]

FINDINGS: Lower chest: No acute findings.

Hepatobiliary: No mass visualized on this unenhanced exam. Prior
cholecystectomy. No evidence of biliary obstruction.

Pancreas: No mass or inflammatory process visualized on this
unenhanced exam.

Spleen:  Within normal limits in size.

Adrenals/Urinary tract: No evidence of urolithiasis or
hydronephrosis. Mild asymmetric left perinephric stranding is seen,
which may be due to pyelonephritis. Unremarkable unopacified urinary
bladder.

Stomach/Bowel: No evidence of obstruction, inflammatory process, or
abnormal fluid collections. Diverticulosis is seen mainly involving
the descending and sigmoid colon, however there is no evidence of
diverticulitis.

Vascular/Lymphatic: No pathologically enlarged lymph nodes
identified. Stable left retroperitoneal varices along the course of
the gonadal vein. No evidence of abdominal aortic aneurysm. Aortic
atherosclerosis.

Reproductive:  No mass or other significant abnormality.

Other:  None.

Musculoskeletal:  No suspicious bone lesions identified.
IMPRESSION: Mild asymmetric left perinephric stranding, suspicious for
pyelonephritis. No evidence of ureteral calculi or hydronephrosis.

Colonic diverticulosis, without radiographic evidence of
diverticulitis.

## 2020-12-30 ENCOUNTER — Ambulatory Visit: Payer: Medicare Other | Admitting: Dermatology

## 2021-04-03 ENCOUNTER — Ambulatory Visit (INDEPENDENT_AMBULATORY_CARE_PROVIDER_SITE_OTHER): Payer: Medicare Other | Admitting: Vascular Surgery

## 2021-04-03 ENCOUNTER — Encounter (INDEPENDENT_AMBULATORY_CARE_PROVIDER_SITE_OTHER): Payer: Self-pay

## 2021-04-03 ENCOUNTER — Encounter (INDEPENDENT_AMBULATORY_CARE_PROVIDER_SITE_OTHER): Payer: Self-pay | Admitting: Vascular Surgery

## 2021-04-03 ENCOUNTER — Other Ambulatory Visit: Payer: Self-pay

## 2021-04-03 VITALS — BP 145/91 | HR 91 | Resp 15 | Wt 128.8 lb

## 2021-04-03 DIAGNOSIS — J449 Chronic obstructive pulmonary disease, unspecified: Secondary | ICD-10-CM

## 2021-04-03 DIAGNOSIS — I890 Lymphedema, not elsewhere classified: Secondary | ICD-10-CM

## 2021-04-03 DIAGNOSIS — I872 Venous insufficiency (chronic) (peripheral): Secondary | ICD-10-CM | POA: Diagnosis not present

## 2021-04-03 DIAGNOSIS — E782 Mixed hyperlipidemia: Secondary | ICD-10-CM

## 2021-04-03 DIAGNOSIS — E119 Type 2 diabetes mellitus without complications: Secondary | ICD-10-CM | POA: Diagnosis not present

## 2021-04-03 NOTE — Progress Notes (Signed)
MRN : [id number]  [name] is a 86 y.o. (10-26-1935) female who presents with chief complaint of No chief complaint on file. Marland Kitchen  History of Present Illness:   Patient is seen for evaluation of leg pain and leg swelling. The patient first noticed the swelling remotely. The swelling is associated with pain and discoloration. The pain and swelling worsens with prolonged dependency and improves with elevation. The pain is unrelated to activity.  The patient notes that in the morning the legs are significantly improved but they steadily worsened throughout the course of the day. The patient also notes a steady worsening of the discoloration in the ankle and shin area.   The patient denies claudication symptoms.  The patient denies symptoms consistent with rest pain.  The patient denies and extensive history of DJD and LS spine disease.  The patient has no had any past angiography, interventions or vascular surgery.  Elevation makes the leg symptoms better, dependency makes them much worse. There is no history of ulcerations. The patient denies any recent changes in medications.  The patient has not been wearing graduated compression.  The patient denies a history of DVT or PE. There is no prior history of phlebitis. There is no history of primary lymphedema.  No history of malignancies. No history of trauma or groin or pelvic surgery. There is no history of radiation treatment to the groin or pelvis  The patient denies amaurosis fugax or recent TIA symptoms. There are no recent neurological changes noted. The patient denies recent episodes of angina or shortness of breath  No outpatient medications have been marked as taking for the 04/03/21 encounter (Appointment) with Delana Meyer, Dolores Lory, MD.    Past Medical History:  Diagnosis Date  . Breast cancer ([place]) 2016   Right- radiation  . COPD (chronic obstructive pulmonary disease) ([place])   . Dermatitis 11/15/2019   Interface  dermatitis, Bx proven.  . Diabetes mellitus without complication ([place])   . Hyperlipidemia   . Hypothyroidism   . Osteoporosis   . Psoriasis   . Substance abuse ([place])    tobacco  . Thyroid disease   . Vertigo    x1, several yrs ago  . Wears dentures    partial upper    Past Surgical History:  Procedure Laterality Date  . APPENDECTOMY    . BREAST BIOPSY Right 2002   neg  . BREAST EXCISIONAL BIOPSY Right 02/05/2015   lumpectomy + radation  . CATARACT EXTRACTION W/PHACO Left 06/28/2017   Procedure: CATARACT EXTRACTION PHACO AND INTRAOCULAR LENS PLACEMENT (IOC)  Left Diabetic;  Surgeon: Eulogio Bear, MD;  Location: [facility];  Service: Ophthalmology;  Laterality: Left;  Diabetic - oral meds  . CATARACT EXTRACTION W/PHACO Right 08/02/2017   Procedure: CATARACT EXTRACTION PHACO AND INTRAOCULAR LENS PLACEMENT (IOC)RIGHT DIABETIC;  Surgeon: Eulogio Bear, MD;  Location: [facility];  Service: Ophthalmology;  Laterality: Right;  DIABETIC WANTS TO BE AFTER 8  . CESAREAN SECTION    . CHOLECYSTECTOMY    . COLONOSCOPY WITH PROPOFOL N/A 03/05/2016   Procedure: COLONOSCOPY WITH PROPOFOL;  Surgeon: Manya Silvas, MD;  Location: [facility] ENDOSCOPY;  Service: Endoscopy;  Laterality: N/A;  . ESOPHAGOGASTRODUODENOSCOPY (EGD) WITH PROPOFOL N/A 03/05/2016   Procedure: ESOPHAGOGASTRODUODENOSCOPY (EGD) WITH PROPOFOL;  Surgeon: Manya Silvas, MD;  Location: [facility] ENDOSCOPY;  Service: Endoscopy;  Laterality: N/A;  . TONSILLECTOMY      Social History Social History   Tobacco Use  . Smoking status:  Current Every Day Smoker    Packs/day: 1.00    Years: 50.00    Pack years: 50.00  . Smokeless tobacco: Never Used  Substance Use Topics  . Alcohol use: No  . Drug use: No    Family History No family history of bleeding/clotting disorders, porphyria or autoimmune disease   Allergies  Allergen Reactions  . Atorvastatin   . Codeine Diarrhea and Nausea And Vomiting  .  Septra [Sulfamethoxazole-Trimethoprim] Swelling  . Sulfa Antibiotics Swelling     REVIEW OF SYSTEMS (Negative unless checked)  Constitutional: [] Weight loss  [] Fever  [] Chills Cardiac: [] Chest pain   [] Chest pressure   [] Palpitations   [] Shortness of breath when laying flat   [] Shortness of breath with exertion. Vascular:  [] Pain in legs with walking   [x] Pain in legs at rest  [] History of DVT   [] Phlebitis   [x] Swelling in legs   [] Varicose veins   [] Non-healing ulcers Pulmonary:   [] Uses home oxygen   [] Productive cough   [] Hemoptysis   [] Wheeze  [] COPD   [] Asthma Neurologic:  [] Dizziness   [] Seizures   [] History of stroke   [] History of TIA  [] Aphasia   [] Vissual changes   [] Weakness or numbness in arm   [] Weakness or numbness in leg Musculoskeletal:   [] Joint swelling   [] Joint pain   [] Low back pain Hematologic:  [] Easy bruising  [] Easy bleeding   [] Hypercoagulable state   [] Anemic Gastrointestinal:  [] Diarrhea   [] Vomiting  [] Gastroesophageal reflux/heartburn   [] Difficulty swallowing. Genitourinary:  [] Chronic kidney disease   [] Difficult urination  [] Frequent urination   [] Blood in urine Skin:  [] Rashes   [] Ulcers  Psychological:  [] History of anxiety   []  History of major depression.  Physical Examination  There were no vitals filed for this visit. There is no height or weight on file to calculate BMI. Gen: WD/WN, NAD Head: [place]/AT, No temporalis wasting.  Ear/Nose/Throat: Hearing grossly intact, nares w/o erythema or drainage, poor dentition Eyes: PER, EOMI, sclera nonicteric.  Neck: Supple, no masses.  No bruit or JVD.  Pulmonary:  Good air movement, clear to auscultation bilaterally, no use of accessory muscles.  Cardiac: RRR, normal S1, S2, no Murmurs. Vascular: scattered varicosities present bilaterally.  Moderate venous stasis changes to the legs bilaterally.  3-4+ hard non-pitting edema, right > left. Vessel Right Left  Radial Palpable Palpable  PT Not Palpable Not  Palpable  DP Palpable Palpable  Gastrointestinal: soft, non-distended. No guarding/no peritoneal signs.  Musculoskeletal: M/S 5/5 throughout.  No deformity or atrophy.  Neurologic: CN 2-12 intact. Pain and light touch intact in extremities.  Symmetrical.  Speech is fluent. Motor exam as listed above. Psychiatric: Judgment intact, Mood & affect appropriate for pt's clinical situation. Dermatologic: Venous rashes or ulcers noted.  No changes consistent with cellulitis. Lymph : + lichenification / skin changes of chronic lymphedema.  CBC Lab Results  Component Value Date   WBC 8.7 11/26/2018   HGB 12.4 11/26/2018   HCT 37.9 11/26/2018   MCV 90.7 11/26/2018   PLT 161 11/26/2018    BMET    Component Value Date/Time   NA 135 11/27/2018 0337   K 4.0 11/27/2018 0337   CL 102 11/27/2018 0337   CO2 24 11/27/2018 0337   GLUCOSE 130 (H) 11/27/2018 0337   BUN 6 (L) 11/27/2018 0337   CREATININE 0.63 11/27/2018 0337   CALCIUM 8.4 (L) 11/27/2018 0337   GFRNONAA >60 11/27/2018 0337   GFRAA >60 11/27/2018 0337   CrCl cannot  be calculated (Patient's most recent lab result is older than the maximum 21 days allowed.).  COAG Lab Results  Component Value Date   INR 1.16 11/24/2018    Radiology No results found.   Assessment/Plan 1. Chronic venous insufficiency No surgery or intervention at this point in time.    I have had a long discussion with the patient regarding venous insufficiency and why it  causes symptoms, specifically out of control edema and the likely result of venous ulceration . I have discussed with the patient the chronic skin changes that accompany venous insufficiency and the long term sequela such as infection and recurring  ulceration.  Patient will be placed in [facility] which will be changed weekly drainage permitting.  In addition, behavioral modification including several periods of elevation of the lower extremities during the day will be continued. Achieving a  position with the ankles at heart level was stressed to the patient  The patient is instructed to begin routine exercise, especially walking on a daily basis  Patient should undergo duplex ultrasound of the venous system to ensure that DVT or reflux is not present.  Following the review of the ultrasound the patient will follow up in one week to reassess the degree of swelling and the control that Unna therapy is offering.   The patient can be assessed for graduated compression stockings or wraps as well as a Lymph Pump once the ulcers are healed.   2. Lymphedema No surgery or intervention at this point in time.    I have had a long discussion with the patient regarding venous insufficiency and why it  causes symptoms, specifically out of control edema and the likely result of venous ulceration . I have discussed with the patient the chronic skin changes that accompany venous insufficiency and the long term sequela such as infection and recurring  ulceration.  Patient will be placed in [facility] which will be changed weekly drainage permitting.  In addition, behavioral modification including several periods of elevation of the lower extremities during the day will be continued. Achieving a position with the ankles at heart level was stressed to the patient  The patient is instructed to begin routine exercise, especially walking on a daily basis  Patient should undergo duplex ultrasound of the venous system to ensure that DVT or reflux is not present.  Following the review of the ultrasound the patient will follow up in one week to reassess the degree of swelling and the control that Unna therapy is offering.   The patient can be assessed for graduated compression stockings or wraps as well as a Lymph Pump once the ulcers are healed.   3. Chronic obstructive pulmonary disease, unspecified COPD type ([place]) Continue pulmonary medications and aerosols as already ordered, these medications have  been reviewed and there are no changes at this time.    4. Type 2 diabetes mellitus without complication, unspecified whether long term insulin use ([place]) Continue hypoglycemic medications as already ordered, these medications have been reviewed and there are no changes at this time.  Hgb A1C to be monitored as already arranged by primary service   5. Mixed hyperlipidemia Continue statin as ordered and reviewed, no changes at this time     Hortencia Pilar, MD  04/03/2021 2:40 PM

## 2021-04-07 ENCOUNTER — Encounter (INDEPENDENT_AMBULATORY_CARE_PROVIDER_SITE_OTHER): Payer: Self-pay | Admitting: Vascular Surgery

## 2021-04-07 DIAGNOSIS — I890 Lymphedema, not elsewhere classified: Secondary | ICD-10-CM | POA: Insufficient documentation

## 2021-04-07 DIAGNOSIS — I872 Venous insufficiency (chronic) (peripheral): Secondary | ICD-10-CM | POA: Insufficient documentation

## 2021-04-09 ENCOUNTER — Encounter (INDEPENDENT_AMBULATORY_CARE_PROVIDER_SITE_OTHER): Payer: Self-pay

## 2021-04-09 ENCOUNTER — Other Ambulatory Visit: Payer: Self-pay

## 2021-04-09 ENCOUNTER — Ambulatory Visit (INDEPENDENT_AMBULATORY_CARE_PROVIDER_SITE_OTHER): Payer: Medicare Other | Admitting: Nurse Practitioner

## 2021-04-09 VITALS — BP 147/72 | HR 90 | Resp 15 | Wt 127.2 lb

## 2021-04-09 DIAGNOSIS — I872 Venous insufficiency (chronic) (peripheral): Secondary | ICD-10-CM

## 2021-04-09 NOTE — Progress Notes (Signed)
History of Present Illness  There is no documented history at this time  Assessments & Plan   There are no diagnoses linked to this encounter.    Additional instructions  Subjective:  Patient presents with venous ulcer of the Bilateral lower extremity.    Procedure:  3 layer unna wrap was placed Bilateral lower extremity.   Plan:   Follow up in one week.  

## 2021-04-13 ENCOUNTER — Encounter (INDEPENDENT_AMBULATORY_CARE_PROVIDER_SITE_OTHER): Payer: Self-pay | Admitting: Nurse Practitioner

## 2021-04-16 ENCOUNTER — Encounter (INDEPENDENT_AMBULATORY_CARE_PROVIDER_SITE_OTHER): Payer: Self-pay

## 2021-04-16 ENCOUNTER — Other Ambulatory Visit: Payer: Self-pay

## 2021-04-16 ENCOUNTER — Ambulatory Visit (INDEPENDENT_AMBULATORY_CARE_PROVIDER_SITE_OTHER): Payer: Medicare Other | Admitting: Nurse Practitioner

## 2021-04-16 DIAGNOSIS — I890 Lymphedema, not elsewhere classified: Secondary | ICD-10-CM

## 2021-04-16 NOTE — Progress Notes (Signed)
Pt didn't want to be wrapped.

## 2021-04-17 ENCOUNTER — Encounter (INDEPENDENT_AMBULATORY_CARE_PROVIDER_SITE_OTHER): Payer: Self-pay

## 2021-04-21 ENCOUNTER — Encounter (INDEPENDENT_AMBULATORY_CARE_PROVIDER_SITE_OTHER): Payer: Self-pay | Admitting: Nurse Practitioner

## 2021-06-30 ENCOUNTER — Other Ambulatory Visit: Payer: Self-pay

## 2021-06-30 ENCOUNTER — Ambulatory Visit: Payer: Medicare Other | Admitting: Dermatology

## 2021-06-30 DIAGNOSIS — L409 Psoriasis, unspecified: Secondary | ICD-10-CM | POA: Diagnosis not present

## 2021-06-30 DIAGNOSIS — R21 Rash and other nonspecific skin eruption: Secondary | ICD-10-CM | POA: Diagnosis not present

## 2021-06-30 MED ORDER — OPZELURA 1.5 % EX CREA: 1.0000 "application " | TOPICAL_CREAM | Freq: Every day | CUTANEOUS | 0 refills | Status: DC

## 2021-06-30 NOTE — Patient Instructions (Addendum)
Start Opzelura one to two times daily. Continue clobetasol 0.05% cream once a day every other day. Avoid applying to face, groin, and axilla. Use as directed. Risk of skin atrophy with long-term use reviewed.   Topical steroids (such as triamcinolone, fluocinolone, fluocinonide, mometasone, clobetasol, halobetasol, betamethasone, hydrocortisone) can cause thinning and lightening of the skin if they are used for too long in the same area. Your physician has selected the right strength medicine for your problem and area affected on the body. Please use your medication only as directed by your physician to prevent side effects.    Side effects of Otezla (apremilast) include diarrhea, nausea, headache, upper respiratory infection, depression, and weight decrease (5-10%). It should only be taken by pregnant women after a discussion regarding risks and benefits with their doctor. Goal is control of skin condition, not cure.  The use of [place] Nail requires long term medication management, including periodic office visits.  If you have any questions or concerns for your doctor, please call our main line at [phone] and press option 4 to reach your doctor's medical assistant. If no one answers, please leave a voicemail as directed and we will return your call as soon as possible. Messages left after 4 pm will be answered the following business day.   You may also send [place] a message via [place]. We typically respond to MyChart messages within 1-2 business days.  For prescription refills, please ask your pharmacy to contact our office. Our fax number is [phone].  If you have an urgent issue when the clinic is closed that cannot wait until the next business day, you can page your doctor at the number below.    Please note that while we do our best to be available for urgent issues outside of office hours, we are not available 24/7.   If you have an urgent issue and are unable to reach [place], you may choose to  seek medical care at your doctor's office, retail clinic, urgent care center, or emergency room.  If you have a medical emergency, please immediately call 911 or go to the emergency department.  Pager Numbers  - Dr. Nehemiah Massed: [phone]  - Dr. Laurence Ferrari: [phone]  - Dr. Nicole Kindred: [phone]  In the event of inclement weather, please call our main line at [phone] for an update on the status of any delays or closures.  Dermatology Medication Tips: Please keep the boxes that topical medications come in in order to help keep track of the instructions about where and how to use these. Pharmacies typically print the medication instructions only on the boxes and not directly on the medication tubes.   If your medication is too expensive, please contact our office at [phone] option 4 or send [place] a message through [place].   We are unable to tell what your co-pay for medications will be in advance as this is different depending on your insurance coverage. However, we may be able to find a substitute medication at lower cost or fill out paperwork to get insurance to cover a needed medication.   If a prior authorization is required to get your medication covered by your insurance company, please allow [place] 1-2 business days to complete this process.  Drug prices often vary depending on where the prescription is filled and some pharmacies may offer cheaper prices.  The website www.goodrx.com contains coupons for medications through different pharmacies. The prices here do not account for what the cost may be with help from insurance (it may  be cheaper with your insurance), but the website can give you the price if you did not use any insurance.  - You can print the associated coupon and take it with your prescription to the pharmacy.  - You may also stop by our office during regular business hours and pick up a GoodRx coupon card.  - If you need your prescription sent electronically to a  different pharmacy, notify our office through [facility] or by phone at [phone] option 4.

## 2021-06-30 NOTE — Progress Notes (Signed)
   Follow-Up Visit   Subjective  [name] is a 86 y.o. female who presents for the following: Rash (Patient here today for rash at chest, present for about 1 week. She is using clobetasol 1-2 times daily but is not having any improvement. Patient has had reaction to atorvastatin in the past but has been on simvastatin for many years. The only new medication she has started is gabapentin and that was in February. ). She continues oral Otezla for her psoriasis and doing well currently.  No side effects from [place] including no headaches no upset stomach or nausea and no depression.  The following portions of the chart were reviewed this encounter and updated as appropriate:   Tobacco  Allergies  Meds  Problems  Med Hx  Surg Hx  Fam Hx     Review of Systems:  No other skin or systemic complaints except as noted in HPI or Assessment and Plan.  Objective  Well appearing patient in no apparent distress; mood and affect are within normal limits.  A focused examination was performed including chest, feet, legs. Relevant physical exam findings are noted in the Assessment and Plan.  Chest Left chest with oval patchy erythema  Bilateral feet Mild hyperkeratosis    Assessment & Plan  Rash -contact dermatitis versus eczema with itch Chest [facility] one to two times daily.  Sample given and Rx sent. Consider Rx for Eucrisa if oxaluria not covered by insurance. Continue clobetasol 0.05% cream once a day every other day. Avoid applying to face, groin, and axilla. Use as directed. Risk of skin atrophy with long-term use reviewed.   Topical steroids (such as triamcinolone, fluocinolone, fluocinonide, mometasone, clobetasol, halobetasol, betamethasone, hydrocortisone) can cause thinning and lightening of the skin if they are used for too long in the same area. Your physician has selected the right strength medicine for your problem and area affected on the body. Please use  your medication only as directed by your physician to prevent side effects.    Ruxolitinib Phosphate (OPZELURA) 1.5 % CREA - Chest Apply 1 application topically daily. Apply 1-2 times daily as needed for itch/rash.  Psoriasis -severe mostly of the feet but currently controlled on oral Otezla.  No side effects to [place]. Bilateral feet Psoriasis is a chronic non-curable, but treatable genetic/hereditary disease that may have other systemic features affecting other organ systems such as joints (Psoriatic Arthritis). It is associated with an increased risk of inflammatory bowel disease, heart disease, non-alcoholic fatty liver disease, and depression.    Continue Otezla 30mg  twice daily  Side effects of Otezla (apremilast) include diarrhea, nausea, headache, upper respiratory infection, depression, and weight decrease (5-10%). It should only be taken by pregnant women after a discussion regarding risks and benefits with their doctor. Goal is control of skin condition, not cure.  The use of R[place] Nail requires long term medication management, including periodic office visits.  Return in about 6 months (around 12/31/2021) for Psoriasis.  Graciella Belton, RMA, am acting as scribe for Sarina Ser, MD . Documentation: I have reviewed the above documentation for accuracy and completeness, and I agree with the above.  Sarina Ser, MD

## 2021-07-01 ENCOUNTER — Telehealth: Payer: Self-pay

## 2021-07-01 NOTE — Telephone Encounter (Signed)
Opzelura not covered. Medicare insurance.

## 2021-07-01 NOTE — Telephone Encounter (Signed)
Patient advised to continue sample. She states she is having improvement and will call me if she needs one more to continue to heal the rash.

## 2021-07-02 ENCOUNTER — Encounter: Payer: Self-pay | Admitting: Dermatology

## 2021-07-04 ENCOUNTER — Other Ambulatory Visit: Payer: Self-pay | Admitting: Dermatology

## 2021-10-16 ENCOUNTER — Ambulatory Visit: Payer: Medicare Other | Admitting: Dermatology

## 2021-10-16 ENCOUNTER — Other Ambulatory Visit: Payer: Self-pay

## 2021-10-16 DIAGNOSIS — L720 Epidermal cyst: Secondary | ICD-10-CM

## 2021-10-16 MED ORDER — DOXYCYCLINE HYCLATE 100 MG PO TABS: ORAL_TABLET | ORAL | 0 refills | Status: DC

## 2021-10-16 NOTE — Progress Notes (Signed)
   Follow-Up Visit   Subjective  [name] is a 86 y.o. female who presents for the following: Cyst.  Recently became painful and red.  The following portions of the chart were reviewed this encounter and updated as appropriate:   Tobacco  Allergies  Meds  Problems  Med Hx  Surg Hx  Fam Hx     Review of Systems:  No other skin or systemic complaints except as noted in HPI or Assessment and Plan.  Objective  Well appearing patient in no apparent distress; mood and affect are within normal limits.  A focused examination was performed including back. Relevant physical exam findings are noted in the Assessment and Plan.  right upper back 4.0 cm Subcutaneous papule/nodule with erythema and edema, tender to touch.    Assessment & Plan  Epidermal cyst right upper back Inflamed symptomatic cyst - not abscessed (yet)  Benign-appearing. Exam most consistent with an epidermal inclusion cyst. Discussed that a cyst is a benign growth that can grow over time and sometimes get irritated or inflamed. Recommend observation if it is not bothersome. Discussed option of surgical excision to remove it if it is growing, symptomatic, or other changes noted. Please call for new or changing lesions so they can be evaluated.   Start Doxycycline 100 mg take 1 tablet twice a day with food x 10 days   Return to the office in 5 days to have this cyst removed or I&D performed if abscessed.  Related Medications doxycycline (VIBRA-TABS) 100 MG tablet Take 1 tablet twice a day with food  Return in about 5 days (around 10/21/2021) for cyst removal .  I, Marye Round, CMA, am acting as scribe for Sarina Ser, MD .  Documentation: I have reviewed the above documentation for accuracy and completeness, and I agree with the above.  Sarina Ser, MD

## 2021-10-16 NOTE — Patient Instructions (Addendum)

## 2021-10-20 ENCOUNTER — Encounter: Payer: Self-pay | Admitting: Dermatology

## 2021-10-21 ENCOUNTER — Other Ambulatory Visit: Payer: Self-pay

## 2021-10-21 ENCOUNTER — Encounter: Payer: Self-pay | Admitting: Dermatology

## 2021-10-21 ENCOUNTER — Ambulatory Visit: Payer: Medicare Other | Admitting: Dermatology

## 2021-10-21 DIAGNOSIS — L0291 Cutaneous abscess, unspecified: Secondary | ICD-10-CM

## 2021-10-21 DIAGNOSIS — L03312 Cellulitis of back [any part except buttock]: Secondary | ICD-10-CM

## 2021-10-21 DIAGNOSIS — L720 Epidermal cyst: Secondary | ICD-10-CM

## 2021-10-21 NOTE — Progress Notes (Signed)
   Follow-Up Visit   Subjective  [name] is a 86 y.o. female who presents for the following: Cyst (R upper back, pt presents for drainage, taking Doxycycline 100mg  1 po bid).  The following portions of the chart were reviewed this encounter and updated as appropriate:   Tobacco  Allergies  Meds  Problems  Med Hx  Surg Hx  Fam Hx     Review of Systems:  No other skin or systemic complaints except as noted in HPI or Assessment and Plan.  Objective  Well appearing patient in no apparent distress; mood and affect are within normal limits.  A focused examination was performed including back. Relevant physical exam findings are noted in the Assessment and Plan.  Right Upper Back Cyst pap    Assessment & Plan  Ruptured epidermal cyst Right Upper Back  I & D - complicated -performed today  Cont Doxycyline 100mg  1 po bid with food and drink until finish 10 days  Incision and Drainage -complicated  - right Upper Back Location: R upper back  Informed Consent: Discussed risks (permanent scarring, light or dark discoloration, infection, pain, bleeding, bruising, redness, damage to adjacent structures, and recurrence of the lesion) and benefits of the procedure, as well as the alternatives.  Informed consent was obtained.  Preparation: The area was prepped with alcohol.  Anesthesia: Lidocaine 1% with epinephrine, bupivicaine  Procedure Details: An incision was made overlying the lesion with a 34mm punch. The lesion drained pus, white, chalky cyst material, and blood.  A large amount of fluid was drained.   Pieces of cyst wall were extracted.  Curettage and irrigation.  Antibiotic ointment and a sterile pressure dressing were applied. The patient tolerated procedure well.  Total number of lesions drained: 1  Plan: The patient was instructed on post-op care. Recommend OTC analgesia as needed for pain.  Return for as scheduled for 6 month f/u.  I, Othelia Pulling, RMA, am  acting as scribe for Sarina Ser, MD . Documentation: I have reviewed the above documentation for accuracy and completeness, and I agree with the above.  Sarina Ser, MD

## 2021-10-21 NOTE — Patient Instructions (Signed)
Wound Care Instructions  Cleanse wound gently with soap and water once a day then pat dry with clean gauze. Apply a thing coat of Petrolatum (petroleum jelly, "Vaseline") over the wound (unless you have an allergy to this). We recommend that you use a new, sterile tube of Vaseline. Do not pick or remove scabs. Do not remove the yellow or white "healing tissue" from the base of the wound.  Cover the wound with fresh, clean, nonstick gauze and secure with paper tape. You may use Band-Aids in place of gauze and tape if the would is small enough, but would recommend trimming much of the tape off as there is often too much. Sometimes Band-Aids can irritate the skin.  You should call the office for your biopsy report after 1 week if you have not already been contacted.  If you experience any problems, such as abnormal amounts of bleeding, swelling, significant bruising, significant pain, or evidence of infection, please call the office immediately.  FOR ADULT SURGERY PATIENTS: If you need something for pain relief you may take 1 extra strength Tylenol (acetaminophen) AND 2 Ibuprofen (200mg each) together every 4 hours as needed for pain. (do not take these if you are allergic to them or if you have a reason you should not take them.) Typically, you may only need pain medication for 1 to 3 days.   If you have any questions or concerns for your doctor, please call our main line at [phone] and press option 4 to reach your doctor's medical assistant. If no one answers, please leave a voicemail as directed and we will return your call as soon as possible. Messages left after 4 pm will be answered the following business day.   You may also send us a message via MyChart. We typically respond to MyChart messages within 1-2 business days.  For prescription refills, please ask your pharmacy to contact our office. Our fax number is [phone].  If you have an urgent issue when the clinic is closed that  cannot wait until the next business day, you can page your doctor at the number below.    Please note that while we do our best to be available for urgent issues outside of office hours, we are not available 24/7.   If you have an urgent issue and are unable to reach us, you may choose to seek medical care at your doctor's office, retail clinic, urgent care center, or emergency room.  If you have a medical emergency, please immediately call 911 or go to the emergency department.  Pager Numbers  - Dr. Kowalski: [phone]  - Dr. Moye: [phone]  - Dr. Stewart: [phone]  In the event of inclement weather, please call our main line at [phone] for an update on the status of any delays or closures.  Dermatology Medication Tips: Please keep the boxes that topical medications come in in order to help keep track of the instructions about where and how to use these. Pharmacies typically print the medication instructions only on the boxes and not directly on the medication tubes.   If your medication is too expensive, please contact our office at [phone] option 4 or send us a message through MyChart.   We are unable to tell what your co-pay for medications will be in advance as this is different depending on your insurance coverage. However, we may be able to find a substitute medication at lower cost or fill out paperwork to get insurance to cover a needed   medication.   If a prior authorization is required to get your medication covered by your insurance company, please allow us 1-2 business days to complete this process.  Drug prices often vary depending on where the prescription is filled and some pharmacies may offer cheaper prices.  The website www.goodrx.com contains coupons for medications through different pharmacies. The prices here do not account for what the cost may be with help from insurance (it may be cheaper with your insurance), but the website can give you the  price if you did not use any insurance.  - You can print the associated coupon and take it with your prescription to the pharmacy.  - You may also stop by our office during regular business hours and pick up a GoodRx coupon card.  - If you need your prescription sent electronically to a different pharmacy, notify our office through [facility] MyChart or by phone at [phone] option 4.   

## 2022-01-01 ENCOUNTER — Ambulatory Visit: Payer: Medicare Other | Admitting: Dermatology

## 2022-02-25 ENCOUNTER — Encounter: Payer: Medicare Other | Attending: Internal Medicine | Admitting: Internal Medicine

## 2022-02-25 ENCOUNTER — Other Ambulatory Visit: Payer: Self-pay

## 2022-02-25 DIAGNOSIS — F17210 Nicotine dependence, cigarettes, uncomplicated: Secondary | ICD-10-CM | POA: Insufficient documentation

## 2022-02-25 DIAGNOSIS — E785 Hyperlipidemia, unspecified: Secondary | ICD-10-CM | POA: Diagnosis not present

## 2022-02-25 DIAGNOSIS — L97822 Non-pressure chronic ulcer of other part of left lower leg with fat layer exposed: Secondary | ICD-10-CM | POA: Insufficient documentation

## 2022-02-25 DIAGNOSIS — I890 Lymphedema, not elsewhere classified: Secondary | ICD-10-CM | POA: Insufficient documentation

## 2022-02-25 DIAGNOSIS — I87312 Chronic venous hypertension (idiopathic) with ulcer of left lower extremity: Secondary | ICD-10-CM | POA: Insufficient documentation

## 2022-02-25 DIAGNOSIS — E039 Hypothyroidism, unspecified: Secondary | ICD-10-CM | POA: Insufficient documentation

## 2022-02-25 DIAGNOSIS — J449 Chronic obstructive pulmonary disease, unspecified: Secondary | ICD-10-CM | POA: Diagnosis not present

## 2022-02-25 DIAGNOSIS — L409 Psoriasis, unspecified: Secondary | ICD-10-CM | POA: Diagnosis not present

## 2022-02-25 DIAGNOSIS — E11622 Type 2 diabetes mellitus with other skin ulcer: Secondary | ICD-10-CM | POA: Diagnosis present

## 2022-02-27 NOTE — Progress Notes (Signed)
MIO, SCHELLINGER ([id number]) ?Visit Report for 02/25/2022 ?Abuse Risk Screen Details ?Patient Name: [name], [name]. ?Date of Service: 02/25/2022 8:45 AM ?Medical Record Number: [id number] ?Patient Account Number: [id number] ?Date of Birth/Sex: [birth year] (87 y.o. F) ?Treating RN: Carlene Coria ?Primary Care Breylin Dom: Harrel Lemon Other Clinician: ?Referring Nanci Lakatos: Referral, Self ?Treating Cheikh Bramble/Extender: Kalman Shan ?Weeks in Treatment: 0 ?Abuse Risk Screen Items ?Answer ?ABUSE RISK SCREEN: ?Has anyone close to you tried to hurt or harm you recentlyo No ?Do you feel uncomfortable with anyone in your familyo No ?Has anyone forced you do things that you didnot want to doo No ?Electronic Signature(s) ?Signed: 02/27/2022 4:20:50 PM By: Carlene Coria RN ?Entered By: Carlene Coria on 02/25/2022 09:06:31 ?SHANYIAH, CONDE ([id number]) ?-------------------------------------------------------------------------------- ?Activities of Daily Living Details ?Patient Name: Deborah, [name]. ?Date of Service: 02/25/2022 8:45 AM ?Medical Record Number: [id number] ?Patient Account Number: [id number] ?Date of Birth/Sex: 1935-03-10 (87 y.o. F) ?Treating RN: Carlene Coria ?Primary Care Alver Leete: Harrel Lemon Other Clinician: ?Referring Tawni Melkonian: Referral, Self ?Treating Shayra Anton/Extender: Kalman Shan ?Weeks in Treatment: 0 ?Activities of Daily Living Items ?Answer ?Activities of Daily Living (Please select one for each item) ?[place] ?Take Medications Completely Able ?Use Telephone Completely Able ?Care for Appearance Completely Able ?Use Toilet Completely Able ?Bath / Shower Completely Able ?Dress Self Completely Able ?Feed Self Completely Able ?Walk Completely Able ?Get In / Out Bed Completely Able ?Housework Completely Able ?Prepare Meals Completely Able ?Handle Money Completely Able ?Shop for Self Completely Able ?Electronic Signature(s) ?Signed: 02/27/2022 4:20:50 PM By: Carlene Coria RN ?Entered By:  Carlene Coria on 02/25/2022 09:06:57 ?HAYDEN, MABIN ([id number]) ?-------------------------------------------------------------------------------- ?Education Screening Details ?Patient Name: Deborah, [name]. ?Date of Service: 02/25/2022 8:45 AM ?Medical Record Number: [id number] ?Patient Account Number: [id number] ?Date of Birth/Sex: [birth year] (87 y.o. F) ?Treating RN: Carlene Coria ?Primary Care Sederick Jacobsen: Harrel Lemon Other Clinician: ?Referring Dail Meece: Referral, Self ?Treating Ladean Steinmeyer/Extender: Kalman Shan ?Weeks in Treatment: 0 ?Primary Learner Assessed: Patient ?Learning Preferences/Education Level/Primary Language ?Learning Preference: Explanation ?Highest Education Level: High School ?Preferred Language: English ?Cognitive Barrier ?Language Barrier: No ?Translator Needed: No ?Memory Deficit: No ?Emotional Barrier: No ?Cultural/Religious Beliefs Affecting Medical Care: No ?Physical Barrier ?Impaired Vision: Yes Glasses ?Impaired Hearing: No ?Decreased Hand dexterity: No ?Knowledge/Comprehension ?Knowledge Level: Medium ?Comprehension Level: Medium ?Ability to understand written instructions: Medium ?Ability to understand verbal instructions: Medium ?Motivation ?Anxiety Level: Anxious ?Cooperation: Cooperative ?Education Importance: Acknowledges Need ?Interest in Health Problems: Asks Questions ?Perception: Coherent ?Willingness to Engage in Self-Management ?High ?Activities: ?Readiness to Engage in Self-Management ?High ?Activities: ?Electronic Signature(s) ?Signed: 02/27/2022 4:20:50 PM By: Carlene Coria RN ?Entered By: Carlene Coria on 02/25/2022 09:07:42 ?AGGIE, DOUSE ([id number]) ?-------------------------------------------------------------------------------- ?Fall Risk Assessment Details ?Patient Name: Deborah, [name]. ?Date of Service: 02/25/2022 8:45 AM ?Medical Record Number: [id number] ?Patient Account Number: [id number] ?Date of Birth/Sex: [birth year] (87 y.o. F) ?Treating RN: Carlene Coria ?Primary  Care Kenlynn Houde: Harrel Lemon Other Clinician: ?Referring Paxton Kanaan: Referral, Self ?Treating Yevonne Yokum/Extender: Kalman Shan ?Weeks in Treatment: 0 ?Fall Risk Assessment Items ?Have you had 2 or more falls in the last 12 monthso 0 No ?Have you had any fall that resulted in injury in the last 12 monthso 0 No ?FALLS RISK SCREEN ?History of falling - immediate or within 3 months 0 No ?Secondary diagnosis (Do you have 2 or more medical diagnoseso) 0 No ?Ambulatory aid ?None/bed rest/wheelchair/nurse 0 No ?Crutches/cane/walker 0 No ?Furniture 0 No ?Intravenous therapy Access/Saline/Heparin Lock 0 No ?Gait/Transferring ?Normal/ bed rest/ wheelchair 0 No ?Weak (short steps with  or without shuffle, stooped but able to lift head while walking, may ?0 No ?seek support from furniture) ?Impaired (short steps with shuffle, may have difficulty arising from chair, head down, impaired ?0 No ?balance) ?Mental Status ?Oriented to own ability 0 No ?Electronic Signature(s) ?Signed: 02/27/2022 4:20:50 PM By: Carlene Coria RN ?Entered By: Carlene Coria on 02/25/2022 09:07:51 ?LAURI, PURDUM ([id number]) ?-------------------------------------------------------------------------------- ?Foot Assessment Details ?Patient Name: Deborah, [name]. ?Date of Service: 02/25/2022 8:45 AM ?Medical Record Number: [id number] ?Patient Account Number: [id number] ?Date of Birth/Sex: 04-Aug-1935 (87 y.o. F) ?Treating RN: Carlene Coria ?Primary Care Arley Salamone: Harrel Lemon Other Clinician: ?Referring Justine Dines: Referral, Self ?Treating Numan Zylstra/Extender: Kalman Shan ?Weeks in Treatment: 0 ?Foot Assessment Items ?Site Locations ?+ = Sensation present, - = Sensation absent, C = Callus, U = Ulcer ?R = Redness, W = Warmth, M = Maceration, PU = Pre-ulcerative lesion ?F = Fissure, S = Swelling, D = Dryness ?Assessment ?Right: Left: ?Other Deformity: No No ?Prior Foot Ulcer: No No ?Prior Amputation: No No ?Charcot Joint: No No ?Ambulatory Status: Ambulatory  Without Help ?Gait: Steady ?Electronic Signature(s) ?Signed: 02/27/2022 4:20:50 PM By: Carlene Coria RN ?Entered By: Carlene Coria on 02/25/2022 09:19:38 ?SACRED, ROA ([id number]) ?-------------------------------------------------------------------------------- ?Nutrition Risk Screening Details ?Patient Name: KARILYNN, CARRANZA. ?Date of Service: 02/25/2022 8:45 AM ?Medical Record Number: [id number] ?Patient Account Number: [id number] ?Date of Birth/Sex: 07-24-1935 (87 y.o. F) ?Treating RN: Carlene Coria ?Primary Care Jamielyn Petrucci: Harrel Lemon Other Clinician: ?Referring Remington Highbaugh: Referral, Self ?Treating Barth Trella/Extender: Kalman Shan ?Weeks in Treatment: 0 ?Height (in): 64 ?Weight (lbs): 140 ?Body Mass Index (BMI): 24 ?Nutrition Risk Screening Items ?Score Screening ?NUTRITION RISK SCREEN: ?I have an illness or condition that made me change the kind and/or amount of food I eat 0 No ?I eat fewer than two meals per day 0 No ?I eat few fruits and vegetables, or milk products 0 No ?I have three or more drinks of beer, liquor or wine almost every day 0 No ?I have tooth or mouth problems that make it hard for me to eat 0 No ?I don't always have enough money to buy the food I need 0 No ?I eat alone most of the time 0 No ?I take three or more different prescribed or over-the-counter drugs a day 1 Yes ?Without wanting to, I have lost or gained 10 pounds in the last six months 0 No ?I am not always physically able to shop, cook and/or feed myself 0 No ?Nutrition Protocols ?Good Risk Protocol 0 No interventions needed ?Moderate Risk Protocol ?High Risk Proctocol ?Risk Level: Good Risk ?Score: 1 ?Electronic Signature(s) ?Signed: 02/27/2022 4:20:50 PM By: Carlene Coria RN ?Entered By: Carlene Coria on 02/25/2022 09:08:09 ?

## 2022-02-27 NOTE — Progress Notes (Signed)
THIA, OLESEN ([id number]) ?Visit Report for 02/25/2022 ?Chief Complaint Document Details ?Patient Name: [name], [name]. ?Date of Service: 02/25/2022 8:45 AM ?Medical Record Number: [id number] ?Patient Account Number: [id number] ?Date of Birth/Sex: 1935-06-21 (87 y.o. F) ?Treating RN: Carlene Coria ?Primary Care Provider: Harrel Lemon Other Clinician: ?Referring Provider: Referral, Self ?Treating Provider/Extender: Kalman Shan ?Weeks in Treatment: 0 ?Information Obtained from: Patient ?Chief Complaint ?Left lower extremity ulcer ?Electronic Signature(s) ?Signed: 02/25/2022 10:34:57 AM By: Kalman Shan DO ?Previous Signature: 02/25/2022 9:48:57 AM Version By: Kalman Shan DO ?Entered By: Kalman Shan on 02/25/2022 10:06:41 ?[name], [name] ([id number]) ?-------------------------------------------------------------------------------- ?HPI Details ?Patient Name: [name], [name]. ?Date of Service: 02/25/2022 8:45 AM ?Medical Record Number: [id number] ?Patient Account Number: [id number] ?Date of Birth/Sex: [birth year] (87 y.o. F) ?Treating RN: Carlene Coria ?Primary Care Provider: Harrel Lemon Other Clinician: ?Referring Provider: Referral, Self ?Treating Provider/Extender: Kalman Shan ?Weeks in Treatment: 0 ?History of Present Illness ?HPI Description: 87 year old patient who is known to have diabetes mellitus and tobacco abuse has had a injury to the right posterior calf about ?a month ago. He has been treated with local care and 2 courses of antibiotics which include Keflex and doxycycline which she has completed. ?Smokes about a pack and a half of cigarettes a day and has a past medical history of COPD, diabetes mellitus, hyperlipidemia,psoriasis and ?hypothyroidism. ?Readmission: ?04/04/18 patient presents today for a initial evaluation today concerning a right forearm skin tear which has been present for about two weeks. ?Currently need this point has been applied to the wound bed followed by a  protective dressing that has been secured by wrapping instead of ?adhesive's. Currently the patient's wound does seem to be doing better patient and her husband states that the excess skin from the skin tear ?was actually trimmed away prior to starting the wound care they have been performing. With that being said she seems to be doing well there's ?no evidence of infection which is good news and overall the wound seems to be showing signs of good granulation and overall improvement which ?is great news. Fortunately she has no with valid bladder dysfunction unexpected fevers chills or weight loss. This seems to be rather ?straightforward in my opinion in regard to treatment. ?04/12/18 on evaluation today patient appears to be doing very well in regard to her right forearm skin tear. She's been tolerating the dressing ?changes without complication. In general this appears to show signs of great improvement since last time I saw her. Overall I'm very pleased with ?how things are going patient still somewhat scared about the fact that the skin that is healing over appears very fragile I explained that it does ?take time for this to really tough enough even once it closes over. Fortunately there is no evidence of infection. ?04/19/18 on evaluation today patient appears to be doing very well in regard to her right forearm ulcer. This actually appears to be completely ?healed at this point which is good news. There does not appear to be any evidence of infection currently. She still has a little bit of discomfort due ?to some underlying irritation but overall she is doing excellent. ?Readmission: ?02/14/19 on evaluation today patient actually appears to be doing very well all things considering in regard to her right shin ulcer which occurred as ?a result of a picture frame dropping onto the leg last Thursday. This is not been quite a week as of today. Because we couldn't get him today this ?happened she actually did go to  urgent care was she with this guy Keflex for week and also a pressure dressing was applied. this has caused some ?issues with fluid buildup below and above the dressing unfortunately. Fortunately there does not appear to be signs of infection at this time which ?is excellent news No fevers, chills, nausea, or vomiting noted at this time. ?02/21/19 on evaluation today patient actually appears to be doing much better at this point in regard to her right lower [place] ulcer. She's been ?tolerating the dressing changes without complication. Fortunately there's no evidence of infection at this time. No fevers, chills, nausea, or ?vomiting noted at this time. ?02/28/19 on evaluation today patient actually appears to be doing very well in regard to her lower extremity ulcer. She has been tolerating the ?dressing changes without complication. Fortunately this seems to be doing significantly better which is great news. Overall I'm very pleased with ?the progress. I do believe that the patient is doing excellent overall from the standpoint of her wound although there was some dressing stuck to ?the surface of the wound that did require sharp debridement today. ?Readmission: ?09/13/2020 upon evaluation today patient appears to be doing well all things considered in regard to her right leg skin tear. She has been putting ?some Vaseline on this nothing else at this point. Fortunately there is no signs of active infection which is great news this happened she tells me ?about 4 days ago. She decided to come in and have me take a look at it to ensure nothing else was going on. She does not even remember how ?exactly she did this other than she had some burning/stinging on her leg and she went to rub it causing this to open. ?10/6; follow-up with regards to her right lower extremity anterior skin tear. We put Steri-Strips on this last week. The skin around this especially ?medially is still viable which is good news. The wound is not  totally opposed ?10/13; 2-week follow-up. Patient has a right leg skin tear. I gather the skin did not totally oppose however she has a very healthy small wound on ?the right anterior lower leg. We have been using Xeroform and conform with net ?09/30/2020 upon evaluation today patient appears to actually be doing quite well in regard to her leg ulcer which in fact appears to be very close ?to complete resolution and healing. I am extremely pleased with where things stand and overall I think she will likely be healed in the next 1-2 ?weeks. ?10/10/2020 on evaluation today patient appears to be doing excellent in fact her wound appears to be completely healed which is great news. ?There is no signs of active infection at this time. ?Readmission 02/25/2022 ?[name] is an 87 year old female with a past medical history of COPD, type 2 diabetes, venous insufficiency/lymphedema and ?hypothyroidism that presents to the clinic for a 72-monthhistory of nonhealing ulcer to the left lower extremity. She reports having a prior biopsy ?to this area that was confirmed to not be a neoplasm. She states that this area often opens and then heals up. This time she has not been able to ?close the wound. She has been using Xeroform. She has not been using her compression stockings. She denies signs of infection. ?MDUSTIN, BURRILL([id number] ?Electronic Signature(s) ?Signed: 02/25/2022 10:34:57 AM By: HKalman ShanDO ?Previous Signature: 02/25/2022 9:48:57 AM Version By: HKalman ShanDO ?Entered By: HKalman Shanon 02/25/2022 10:14:52 ?[name], [name]([id number] ?-------------------------------------------------------------------------------- ?Physical Exam Details ?Patient Name: [name], Deborah  O. ?Date of Service: 02/25/2022 8:45 AM ?Medical Record Number: [id number] ?Patient Account Number: [id number] ?Date of Birth/Sex: [birth year] (87 y.o. F) ?Treating RN: Carlene Coria ?Primary Care Provider: Harrel Lemon Other  Clinician: ?Referring Provider: Referral, Self ?Treating Provider/Extender: Kalman Shan ?Weeks in Treatment: 0 ?Constitutional ?. ?Cardiovascular ?Marland Kitchen ?Psychiatric ?Marland Kitchen ?Notes ?Left lower extremity: To the distal an

## 2022-02-27 NOTE — Progress Notes (Signed)
Deborah, [name] ([id number]) ?Visit Report for 02/25/2022 ?Allergy List Details ?Patient Name: Deborah, [name]. ?Date of Service: 02/25/2022 8:45 AM ?Medical Record Number: [id number] ?Patient Account Number: [id number] ?Date of Birth/Sex: May 11, 1935 (87 y.o. F) ?Treating RN: Carlene Coria ?Primary Care Kenidi Elenbaas: Harrel Lemon Other Clinician: ?Referring Alura Olveda: Referral, Self ?Treating Garrus Gauthreaux/Extender: Kalman Shan ?Weeks in Treatment: 0 ?Allergies ?Active Allergies ?Sulfa (Sulfonamide Antibiotics) ?atorvastatin ?Allergy Notes ?Electronic Signature(s) ?Signed: 02/27/2022 4:20:50 PM By: Carlene Coria RN ?Entered By: Carlene Coria on 02/25/2022 09:05:44 ?[name] ([id number]) ?-------------------------------------------------------------------------------- ?Arrival Information Details ?Patient Name: Deborah, [name]. ?Date of Service: 02/25/2022 8:45 AM ?Medical Record Number: [id number] ?Patient Account Number: [id number] ?Date of Birth/Sex: August 04, 1935 (87 y.o. F) ?Treating RN: Carlene Coria ?Primary Care Kessler Kopinski: Harrel Lemon Other Clinician: ?Referring Liylah Najarro: Referral, Self ?Treating Vernestine Brodhead/Extender: Kalman Shan ?Weeks in Treatment: 0 ?Visit Information ?Patient Arrived: Ambulatory ?Arrival Time: 08:56 ?Accompanied By: self ?Transfer Assistance: None ?Patient Identification Verified: Yes ?Secondary Verification Process Completed: Yes ?Patient Requires Transmission-Based Precautions: No ?Patient Has Alerts: No ?History Since Last Visit ?All ordered tests and consults were completed: No ?Added or deleted any medications: No ?Any new allergies or adverse reactions: No ?Had a fall or experienced change in activities of daily living that may affect risk of falls: No ?Signs or symptoms of abuse/neglect since last visito No ?Hospitalized since last visit: No ?Implantable device outside of the clinic excluding cellular tissue based products placed in the center since last visit: No ?Has Dressing in Place as  Prescribed: Yes ?Electronic Signature(s) ?Signed: 02/27/2022 4:20:50 PM By: Carlene Coria RN ?Entered By: Carlene Coria on 02/25/2022 09:02:17 ?Deborah, [name] ([id number]) ?-------------------------------------------------------------------------------- ?Clinic Level of Care Assessment Details ?Patient Name: Deborah, [name]. ?Date of Service: 02/25/2022 8:45 AM ?Medical Record Number: [id number] ?Patient Account Number: [id number] ?Date of Birth/Sex: Jul 14, 1935 (87 y.o. F) ?Treating RN: Carlene Coria ?Primary Care Julien Oscar: Harrel Lemon Other Clinician: ?Referring Kelsha Older: Referral, Self ?Treating Henok Heacock/Extender: Kalman Shan ?Weeks in Treatment: 0 ?Clinic Level of Care Assessment Items ?TOOL 2 Quantity Score ?X - Use when only an EandM is performed on the INITIAL visit 1 0 ?ASSESSMENTS - Nursing Assessment / Reassessment ?X - General Physical Exam (combine w/ comprehensive assessment (listed just below) when performed on new ?1 20 ?pt. evals) ?X- 1 25 ?Comprehensive Assessment (HX, ROS, Risk Assessments, Wounds Hx, etc.) ?ASSESSMENTS - Wound and Skin Assessment / Reassessment ?X - Simple Wound Assessment / Reassessment - one wound 1 5 ?'[]'$  - 0 ?Complex Wound Assessment / Reassessment - multiple wounds ?'[]'$  - 0 ?Dermatologic / Skin Assessment (not related to wound area) ?ASSESSMENTS - Ostomy and/or Continence Assessment and Care ?'[]'$  - Incontinence Assessment and Management 0 ?'[]'$  - 0 ?Ostomy Care Assessment and Management (repouching, etc.) ?PROCESS - Coordination of Care ?X - Simple Patient / Family Education for ongoing care 1 15 ?'[]'$  - 0 ?Complex (extensive) Patient / Family Education for ongoing care ?'[]'$  - 0 ?Staff obtains Consents, Records, Test Results / Process Orders ?'[]'$  - 0 ?Staff telephones HHA, Nursing Homes / Clarify orders / etc ?'[]'$  - 0 ?Routine Transfer to another Facility (non-emergent condition) ?'[]'$  - 0 ?Routine Hospital Admission (non-emergent condition) ?'[]'$  - 0 ?New Admissions / Medical laboratory scientific officer / Ordering NPWT, Apligraf, etc. ?'[]'$  - 0 ?Emergency Hospital Admission (emergent condition) ?X- 1 10 ?Simple Discharge Coordination ?'[]'$  - 0 ?Complex (extensive) Discharge Coordination ?PROCESS - Special Needs ?'[]'$  - Pediatric / Minor Patient Management 0 ?'[]'$  - 0 ?Isolation Patient Management ?'[]'$  - 0 ?Hearing / Language / Environmental health practitioner  special needs ?'[]'$  - 0 ?Assessment of Community assistance (transportation, D/C planning, etc.) ?'[]'$  - 0 ?Additional assistance / Altered mentation ?'[]'$  - 0 ?Support Surface(s) Assessment (bed, cushion, seat, etc.) ?INTERVENTIONS - Wound Cleansing / Measurement ?X - Wound Imaging (photographs - any number of wounds) 1 5 ?'[]'$  - 0 ?Wound Tracing (instead of photographs) ?X- 1 5 ?Simple Wound Measurement - one wound ?'[]'$  - 0 ?Complex Wound Measurement - multiple wounds ?Deborah, [name] ([id number]) ?X- 1 5 ?Simple Wound Cleansing - one wound ?'[]'$  - 0 ?Complex Wound Cleansing - multiple wounds ?INTERVENTIONS - Wound Dressings ?X - Small Wound Dressing one or multiple wounds 1 10 ?'[]'$  - 0 ?Medium Wound Dressing one or multiple wounds ?'[]'$  - 0 ?Large Wound Dressing one or multiple wounds ?'[]'$  - 0 ?Application of Medications - injection ?INTERVENTIONS - Miscellaneous ?'[]'$  - External ear exam 0 ?'[]'$  - 0 ?Specimen Collection (cultures, biopsies, blood, body fluids, etc.) ?'[]'$  - 0 ?Specimen(s) / Culture(s) sent or taken to Lab for analysis ?'[]'$  - 0 ?Patient Transfer (multiple staff / Civil Service fast streamer / Similar devices) ?'[]'$  - 0 ?Simple Staple / Suture removal (25 or less) ?'[]'$  - 0 ?Complex Staple / Suture removal (26 or more) ?'[]'$  - 0 ?Hypo / Hyperglycemic Management (close monitor of Blood Glucose) ?X- 1 15 ?Ankle / Brachial Index (ABI) - do not check if billed separately ?Has the patient been seen at the hospital within the last three years: Yes ?Total Score: 115 ?Level Of Care: New/Established - Level ?3 ?Electronic Signature(s) ?Signed: 02/27/2022 4:20:50 PM By: Carlene Coria RN ?Entered By: Carlene Coria on  02/25/2022 09:44:21 ?[name], Deborah ([id number]) ?-------------------------------------------------------------------------------- ?Encounter Discharge Information Details ?Patient Name: Deborah, [name]. ?Date of Service: 02/25/2022 8:45 AM ?Medical Record Number: [id number] ?Patient Account Number: [id number] ?Date of Birth/Sex: 1935-11-19 (87 y.o. F) ?Treating RN: Carlene Coria ?Primary Care Helaina Stefano: Harrel Lemon Other Clinician: ?Referring Fontaine Hehl: Referral, Self ?Treating Chett Taniguchi/Extender: Kalman Shan ?Weeks in Treatment: 0 ?Encounter Discharge Information Items ?Discharge Condition: Stable ?Ambulatory Status: Ambulatory ?Discharge Destination: Home ?Transportation: Private Auto ?Accompanied By: self ?Schedule Follow-up Appointment: Yes ?Clinical Summary of Care: Patient Declined ?Electronic Signature(s) ?Signed: 02/27/2022 4:20:50 PM By: Carlene Coria RN ?Entered By: Carlene Coria on 02/25/2022 09:45:23 ?Deborah, [name] ([id number]) ?-------------------------------------------------------------------------------- ?Lower Extremity Assessment Details ?Patient Name: Deborah, [name]. ?Date of Service: 02/25/2022 8:45 AM ?Medical Record Number: [id number] ?Patient Account Number: [id number] ?Date of Birth/Sex: 1935-03-23 (87 y.o. F) ?Treating RN: Carlene Coria ?Primary Care Dayven Linsley: Harrel Lemon Other Clinician: ?Referring Katessa Attridge: Referral, Self ?Treating Tinie Mcgloin/Extender: Kalman Shan ?Weeks in Treatment: 0 ?Edema Assessment ?Assessed: [Left: No] [Right: No] ?Edema: [Left: Ye] [Right: s] ?Calf ?Left: Right: ?Point of Measurement: 30 cm From Medial Instep 31 cm ?Ankle ?Left: Right: ?Point of Measurement: 11 cm From Medial Instep 21 cm ?Vascular Assessment ?Pulses: ?Dorsalis Pedis ?Palpable: [Left:Yes] ?Blood Pressure: ?Brachial: [Left:148] ?Ankle: ?[Left:Dorsalis Pedis: 856 0.84] ?Electronic Signature(s) ?Signed: 02/27/2022 4:20:50 PM By: Carlene Coria RN ?Entered By: Carlene Coria on 02/25/2022  09:24:34 ?Deborah, [name] ([id number]) ?-------------------------------------------------------------------------------- ?Multi Wound Chart Details ?Patient Name: Deborah, [name]. ?Date of Service: 02/25/2022 8:45 AM ?Medical R

## 2022-03-04 ENCOUNTER — Other Ambulatory Visit: Payer: Self-pay

## 2022-03-04 ENCOUNTER — Encounter (HOSPITAL_BASED_OUTPATIENT_CLINIC_OR_DEPARTMENT_OTHER): Payer: Medicare Other | Admitting: Internal Medicine

## 2022-03-04 DIAGNOSIS — L97822 Non-pressure chronic ulcer of other part of left lower leg with fat layer exposed: Secondary | ICD-10-CM | POA: Diagnosis not present

## 2022-03-04 DIAGNOSIS — E11622 Type 2 diabetes mellitus with other skin ulcer: Secondary | ICD-10-CM | POA: Diagnosis not present

## 2022-03-05 NOTE — Progress Notes (Signed)
FAMA, MUENCHOW ([id number]) ?Visit Report for 03/04/2022 ?Chief Complaint Document Details ?Patient Name: [name], [name]. ?Date of Service: 03/04/2022 2:00 PM ?Medical Record Number: [id number] ?Patient Account Number: [id number] ?Date of Birth/Sex: November 02, 1935 (87 y.o. F) ?Treating RN: Carlene Coria ?Primary Care Provider: Harrel Lemon Other Clinician: ?Referring Provider: Harrel Lemon ?Treating Provider/Extender: Kalman Shan ?Weeks in Treatment: 1 ?Information Obtained from: Patient ?Chief Complaint ?Left lower extremity ulcer ?Electronic Signature(s) ?Signed: 03/04/2022 2:36:22 PM By: Kalman Shan DO ?Entered By: Kalman Shan on 03/04/2022 14:24:01 ?[name], [name] ([id number]) ?-------------------------------------------------------------------------------- ?Debridement Details ?Patient Name: [name], [name]. ?Date of Service: 03/04/2022 2:00 PM ?Medical Record Number: [id number] ?Patient Account Number: [id number] ?Date of Birth/Sex: February 06, 1935 (87 y.o. F) F) ?Treating RN: Carlene Coria ?Primary Care Provider: Harrel Lemon Other Clinician: ?Referring Provider: Harrel Lemon ?Treating Provider/Extender: Kalman Shan ?Weeks in Treatment: 1 ?Debridement Performed for ?Wound #5 Left,Anterior Lower Leg ?Assessment: ?Performed By: Physician Kalman Shan, MD ?Debridement Type: Debridement ?Severity of Tissue Pre Debridement: Fat layer exposed ?Level of Consciousness (Pre- ?Awake and Alert ?procedure): ?Pre-procedure Verification/Time Out ?Yes - 14:12 ?Taken: ?Start Time: 14:12 ?Pain Control: Lidocaine 4% Topical Solution ?Total Area Debrided (L x W): 1 (cm) x 1 (cm) = 1 (cm?) ?Tissue and other material ?Viable, Non-Viable, Slough, Subcutaneous, [place] ?debrided: ?Level: Skin/Subcutaneous Tissue ?Debridement Description: Excisional ?Instrument: Curette ?Bleeding: Minimum ?Hemostasis Achieved: Pressure ?End Time: 14:16 ?Procedural Pain: 0 ?Post Procedural Pain: 0 ?Response to Treatment: Procedure was  tolerated well ?Level of Consciousness (Post- ?Awake and Alert ?procedure): ?Post Debridement Measurements of Total Wound ?Length: (cm) 1 ?Width: (cm) 1 ?Depth: (cm) 0.1 ?Volume: (cm?) 0.079 ?Character of Wound/Ulcer Post Debridement: Improved ?Severity of Tissue Post Debridement: Fat layer exposed ?Post Procedure Diagnosis ?Same as Pre-procedure ?Electronic Signature(s) ?Signed: 03/04/2022 2:36:22 PM By: Kalman Shan DO ?Signed: 03/05/2022 4:22:30 PM By: Carlene Coria RN ?Entered By: Carlene Coria on 03/04/2022 14:13:46 ?[name], [name] ([id number]) ?-------------------------------------------------------------------------------- ?HPI Details ?Patient Name: [name], [name]. ?Date of Service: 03/04/2022 2:00 PM ?Medical Record Number: [id number] ?Patient Account Number: [id number] ?Date of Birth/Sex: [birth year] (87 y.o. F) ?Treating RN: Carlene Coria ?Primary Care Provider: Harrel Lemon Other Clinician: ?Referring Provider: Harrel Lemon ?Treating Provider/Extender: Kalman Shan ?Weeks in Treatment: 1 ?History of Present Illness ?HPI Description: 87 year old patient who is known to have diabetes mellitus and tobacco abuse has had a injury to the right posterior calf about ?a month ago. He has been treated with local care and 2 courses of antibiotics which include Keflex and doxycycline which she has completed. ?Smokes about a pack and a half of cigarettes a day and has a past medical history of COPD, diabetes mellitus, hyperlipidemia,psoriasis and ?hypothyroidism. ?Readmission: ?04/04/18 patient presents today for a initial evaluation today concerning a right forearm skin tear which has been present for about two weeks. ?Currently need this point has been applied to the wound bed followed by a protective dressing that has been secured by wrapping instead of ?adhesive's. Currently the patient's wound does seem to be doing better patient and her husband states that the excess skin from the skin tear ?was actually  trimmed away prior to starting the wound care they have been performing. With that being said she seems to be doing well there's ?no evidence of infection which is good news and overall the wound seems to be showing signs of good granulation and overall improvement which ?is great news. Fortunately she has no with valid bladder dysfunction unexpected fevers chills or weight loss. This seems to be rather ?straightforward in my opinion  in regard to treatment. ?04/12/18 on evaluation today patient appears to be doing very well in regard to her right forearm skin tear. She's been tolerating the dressing ?changes without complication. In general this appears to show signs of great improvement since last time I saw her. Overall I'm very pleased with ?how things are going patient still somewhat scared about the fact that the skin that is healing over appears very fragile I explained that it does ?take time for this to really tough enough even once it closes over. Fortunately there is no evidence of infection. ?04/19/18 on evaluation today patient appears to be doing very well in regard to her right forearm ulcer. This actually appears to be completely ?healed at this point which is good news. There does not appear to be any evidence of infection currently. She still has a little bit of discomfort due ?to some underlying irritation but overall she is doing excellent. ?Readmission: ?02/14/19 on evaluation today patient actually appears to be doing very well all things considering in regard to her right shin ulcer which occurred as ?a result of a picture frame dropping onto the leg last Thursday. This is not been quite a week as of today. Because we couldn't get him today this ?happened she actually did go to urgent care was she with this guy Keflex for week and also a pressure dressing was applied. this has caused some ?issues with fluid buildup below and above the dressing unfortunately. Fortunately there does not appear to be  signs of infection at this time which ?is excellent news No fevers, chills, nausea, or vomiting noted at this time. ?02/21/19 on evaluation today patient actually appears to be doing much better at this point in regard to her right lower [place] ulcer. She's been ?tolerating the dressing changes without complication. Fortunately there's no evidence of infection at this time. No fevers, chills, nausea, or ?vomiting noted at this time. ?02/28/19 on evaluation today patient actually appears to be doing very well in regard to her lower extremity ulcer. She has been tolerating the ?dressing changes without complication. Fortunately this seems to be doing significantly better which is great news. Overall I'm very pleased with ?the progress. I do believe that the patient is doing excellent overall from the standpoint of her wound although there was some dressing stuck to ?the surface of the wound that did require sharp debridement today. ?Readmission: ?09/13/2020 upon evaluation today patient appears to be doing well all things considered in regard to her right leg skin tear. She has been putting ?some Vaseline on this nothing else at this point. Fortunately there is no signs of active infection which is great news this happened she tells me ?about 4 days ago. She decided to come in and have me take a look at it to ensure nothing else was going on. She does not even remember how ?exactly she did this other than she had some burning/stinging on her leg and she went to rub it causing this to open. ?10/6; follow-up with regards to her right lower extremity anterior skin tear. We put Steri-Strips on this last week. The skin around this especially ?medially is still viable which is good news. The wound is not totally opposed ?10/13; 2-week follow-up. Patient has a right leg skin tear. I gather the skin did not totally oppose however she has a very healthy small wound on ?the right anterior lower leg. We have been using Xeroform  and conform with net ?09/30/2020 upon evaluation today  patient appears to actually be doing quite well in regard to her leg ulcer which in fact appears to be very close ?to complete resolution and healing. I am

## 2022-03-05 NOTE — Progress Notes (Signed)
PRESLEY, GORA ([id number]) ?Visit Report for 03/04/2022 ?Arrival Information Details ?Patient Name: [name], [name]. ?Date of Service: 03/04/2022 2:00 PM ?Medical Record Number: [id number] ?Patient Account Number: [id number] ?Date of Birth/Sex: 01-Jul-1935 (87 y.o. F) ?Treating RN: Carlene Coria ?Primary Care Leata Dominy: Harrel Lemon Other Clinician: ?Referring Jensen Cheramie: Harrel Lemon ?Treating Jonay Hitchcock/Extender: Kalman Shan ?Weeks in Treatment: 1 ?Visit Information History Since Last Visit ?All ordered tests and consults were completed: No ?Patient Arrived: Ambulatory ?Added or deleted any medications: No ?Arrival Time: 13:52 ?Any new allergies or adverse reactions: No ?Accompanied By: self ?Had a fall or experienced change in No ?Transfer Assistance: None ?activities of daily living that may affect ?Patient Identification Verified: Yes ?risk of falls: ?Secondary Verification Process Completed: Yes ?Signs or symptoms of abuse/neglect since last visito No ?Patient Requires Transmission-Based Precautions: No ?Hospitalized since last visit: No ?Patient Has Alerts: No ?Implantable device outside of the clinic excluding No ?cellular tissue based products placed in the center ?since last visit: ?Has Dressing in Place as Prescribed: Yes ?Has Compression in Place as Prescribed: Yes ?Pain Present Now: No ?Electronic Signature(s) ?Signed: 03/05/2022 4:22:30 PM By: Carlene Coria RN ?Entered ByCarlene Coria on 03/04/2022 13:56:42 ?AYOMIDE, ZULETA ([id number]) ?-------------------------------------------------------------------------------- ?Clinic Level of Care Assessment Details ?Patient Name: [name], [name]. ?Date of Service: 03/04/2022 2:00 PM ?Medical Record Number: [id number] ?Patient Account Number: [id number] ?Date of Birth/Sex: 1935/02/07 (87 y.o. F) ?Treating RN: Carlene Coria ?Primary Care Manika Hast: Harrel Lemon Other Clinician: ?Referring Marissia Blackham: Harrel Lemon ?Treating Onofre Gains/Extender: Kalman Shan ?Weeks in  Treatment: 1 ?Clinic Level of Care Assessment Items ?TOOL 4 Quantity Score ?X - Use when only an EandM is performed on FOLLOW-UP visit 1 0 ?ASSESSMENTS - Nursing Assessment / Reassessment ?X - Reassessment of Co-morbidities (includes updates in patient status) 1 10 ?X- 1 5 ?Reassessment of Adherence to Treatment Plan ?ASSESSMENTS - Wound and Skin Assessment / Reassessment ?X - Simple Wound Assessment / Reassessment - one wound 1 5 ?'[]'$  - 0 ?Complex Wound Assessment / Reassessment - multiple wounds ?'[]'$  - 0 ?Dermatologic / Skin Assessment (not related to wound area) ?ASSESSMENTS - Focused Assessment ?'[]'$  - Circumferential Edema Measurements - multi extremities 0 ?'[]'$  - 0 ?Nutritional Assessment / Counseling / Intervention ?'[]'$  - 0 ?Lower Extremity Assessment (monofilament, tuning fork, pulses) ?'[]'$  - 0 ?Peripheral Arterial Disease Assessment (using hand held doppler) ?ASSESSMENTS - Ostomy and/or Continence Assessment and Care ?'[]'$  - Incontinence Assessment and Management 0 ?'[]'$  - 0 ?Ostomy Care Assessment and Management (repouching, etc.) ?PROCESS - Coordination of Care ?X - Simple Patient / Family Education for ongoing care 1 15 ?'[]'$  - 0 ?Complex (extensive) Patient / Family Education for ongoing care ?X- 1 10 ?Staff obtains Consents, Records, Test Results / Process Orders ?'[]'$  - 0 ?Staff telephones HHA, Nursing Homes / Clarify orders / etc ?'[]'$  - 0 ?Routine Transfer to another Facility (non-emergent condition) ?'[]'$  - 0 ?Routine Hospital Admission (non-emergent condition) ?'[]'$  - 0 ?New Admissions / Biomedical engineer / Ordering NPWT, Apligraf, etc. ?'[]'$  - 0 ?Emergency Hospital Admission (emergent condition) ?X- 1 10 ?Simple Discharge Coordination ?'[]'$  - 0 ?Complex (extensive) Discharge Coordination ?PROCESS - Special Needs ?'[]'$  - Pediatric / Minor Patient Management 0 ?'[]'$  - 0 ?Isolation Patient Management ?'[]'$  - 0 ?Hearing / Language / Visual special needs ?'[]'$  - 0 ?Assessment of Community assistance (transportation, D/C  planning, etc.) ?'[]'$  - 0 ?Additional assistance / Altered mentation ?'[]'$  - 0 ?Support Surface(s) Assessment (bed, cushion, seat, etc.) ?INTERVENTIONS - Wound Cleansing / Measurement ?[name], [name] ([id number]) ?  X- 1 5 ?Simple Wound Cleansing - one wound ?'[]'$  - 0 ?Complex Wound Cleansing - multiple wounds ?X- 1 5 ?Wound Imaging (photographs - any number of wounds) ?'[]'$  - 0 ?Wound Tracing (instead of photographs) ?X- 1 5 ?Simple Wound Measurement - one wound ?'[]'$  - 0 ?Complex Wound Measurement - multiple wounds ?INTERVENTIONS - Wound Dressings ?X - Small Wound Dressing one or multiple wounds 1 10 ?'[]'$  - 0 ?Medium Wound Dressing one or multiple wounds ?'[]'$  - 0 ?Large Wound Dressing one or multiple wounds ?'[]'$  - 0 ?Application of Medications - topical ?'[]'$  - 0 ?Application of Medications - injection ?INTERVENTIONS - Miscellaneous ?'[]'$  - External ear exam 0 ?'[]'$  - 0 ?Specimen Collection (cultures, biopsies, blood, body fluids, etc.) ?'[]'$  - 0 ?Specimen(s) / Culture(s) sent or taken to Lab for analysis ?'[]'$  - 0 ?Patient Transfer (multiple staff / Civil Service fast streamer / Similar devices) ?'[]'$  - 0 ?Simple Staple / Suture removal (25 or less) ?'[]'$  - 0 ?Complex Staple / Suture removal (26 or more) ?'[]'$  - 0 ?Hypo / Hyperglycemic Management (close monitor of Blood Glucose) ?'[]'$  - 0 ?Ankle / Brachial Index (ABI) - do not check if billed separately ?X- 1 5 ?Vital Signs ?Has the patient been seen at the hospital within the last three years: Yes ?Total Score: 85 ?Level Of Care: New/Established - Level ?3 ?Electronic Signature(s) ?Signed: 03/05/2022 4:22:30 PM By: Carlene Coria RN ?Entered By: Carlene Coria on 03/04/2022 14:11:02 ?[name], [name] ([id number]) ?-------------------------------------------------------------------------------- ?Encounter Discharge Information Details ?Patient Name: [name], [name]. ?Date of Service: 03/04/2022 2:00 PM ?Medical Record Number: [id number] ?Patient Account Number: [id number] ?Date of Birth/Sex: 1935/04/01 (87 y.o.  F) ?Treating RN: Carlene Coria ?Primary Care Roshonda Sperl: Harrel Lemon Other Clinician: ?Referring Rim Thatch: Harrel Lemon ?Treating Reyli Schroth/Extender: Kalman Shan ?Weeks in Treatment: 1 ?Encounter Discharge Information Items Post Procedure Vitals ?Discharge Condition: Stable ?Temperature (?F): 98.3 ?Ambulatory Status: Ambulatory ?Pulse (bpm): 98 ?Discharge Destination: Home ?Respiratory Rate (breaths/min): 20 ?Transportation: Private Auto ?Blood Pressure (mmHg): 138/73 ?Accompanied By: self ?Schedule Follow-up Appointment: Yes ?Clinical Summary of Care: Patient Declined ?Electronic Signature(s) ?Signed: 03/05/2022 4:22:30 PM By: Carlene Coria RN ?Entered By: Carlene Coria on 03/04/2022 14:15:48 ?[name], [name] ([id number]) ?-------------------------------------------------------------------------------- ?Lower Extremity Assessment Details ?Patient Name: [name], [name]. ?Date of Service: 03/04/2022 2:00 PM ?Medical Record Number: [id number] ?Patient Account Number: [id number] ?Date of Birth/Sex: [birth year] (87 y.o. F) ?Treating RN: Carlene Coria ?Primary Care Braxdon Gappa: Harrel Lemon Other Clinician: ?Referring Takita Riecke: Harrel Lemon ?Treating Ayaan Shutes/Extender: Kalman Shan ?Weeks in Treatment: 1 ?Edema Assessment ?Assessed: [Left: No] [Right: No] ?Edema: [Left: Ye] [Right: s] ?Calf ?Left: Right: ?Point of Measurement: 30 cm From Medial Instep 30 cm ?Ankle ?Left: Right: ?Point of Measurement: 11 cm From Medial Instep 20 cm ?Vascular Assessment ?Pulses: ?Dorsalis Pedis ?Palpable: [Left:Yes] ?Electronic Signature(s) ?Signed: 03/05/2022 4:22:30 PM By: Carlene Coria RN ?Entered ByCarlene Coria on 03/04/2022 14:03:28 ?[name], [name] ([id number]) ?-------------------------------------------------------------------------------- ?Multi Wound Chart Details ?Patient Name: [name], [name]. ?Date of Service: 03/04/2022 2:00 PM ?Medical Record Number: [id number] ?Patient Account Number: [id number] ?Date of Birth/Sex: 03-23-1935  (87 y.o. F) ?Treating RN: Carlene Coria ?Primary Care Lang Zingg: Harrel Lemon Other Clinician: ?Referring Rachelle Edwards: Harrel Lemon ?Treating Onalee Steinbach/Extender: Kalman Shan ?Weeks in Treatment: 1 ?Vital Signs

## 2022-03-11 ENCOUNTER — Encounter (HOSPITAL_BASED_OUTPATIENT_CLINIC_OR_DEPARTMENT_OTHER): Payer: Medicare Other | Admitting: Internal Medicine

## 2022-03-11 DIAGNOSIS — L97822 Non-pressure chronic ulcer of other part of left lower leg with fat layer exposed: Secondary | ICD-10-CM | POA: Diagnosis not present

## 2022-03-11 DIAGNOSIS — I87312 Chronic venous hypertension (idiopathic) with ulcer of left lower extremity: Secondary | ICD-10-CM | POA: Diagnosis not present

## 2022-03-11 DIAGNOSIS — E11622 Type 2 diabetes mellitus with other skin ulcer: Secondary | ICD-10-CM | POA: Diagnosis not present

## 2022-03-11 DIAGNOSIS — I890 Lymphedema, not elsewhere classified: Secondary | ICD-10-CM | POA: Diagnosis not present

## 2022-03-11 NOTE — Progress Notes (Signed)
LESSLY, STIGLER ([id number]) ?Visit Report for 03/11/2022 ?Chief Complaint Document Details ?Patient Name: LATALIA, ETZLER. ?Date of Service: 03/11/2022 3:30 PM ?Medical Record Number: [id number] ?Patient Account Number: [id number] ?Date of Birth/Sex: Nov 06, 1935 (87 y.o. F) ?Treating RN: Carlene Coria ?Primary Care Provider: Harrel Lemon Other Clinician: ?Referring Provider: Harrel Lemon ?Treating Provider/Extender: Kalman Shan ?Weeks in Treatment: 2 ?Information Obtained from: Patient ?Chief Complaint ?Left lower extremity ulcer ?Electronic Signature(s) ?Signed: 03/11/2022 4:15:51 PM By: Kalman Shan DO ?Entered By: Kalman Shan on 03/11/2022 15:54:50 ?MARRY, KUSCH ([id number]) ?-------------------------------------------------------------------------------- ?HPI Details ?Patient Name: NATASA, STIGALL. ?Date of Service: 03/11/2022 3:30 PM ?Medical Record Number: [id number] ?Patient Account Number: [id number] ?Date of Birth/Sex: 26-Feb-1935 (87 y.o. F) ?Treating RN: Carlene Coria ?Primary Care Provider: Harrel Lemon Other Clinician: ?Referring Provider: Harrel Lemon ?Treating Provider/Extender: Kalman Shan ?Weeks in Treatment: 2 ?History of Present Illness ?HPI Description: 87 year old patient who is known to have diabetes mellitus and tobacco abuse has had a injury to the right posterior calf about ?a month ago. He has been treated with local care and 2 courses of antibiotics which include Keflex and doxycycline which she has completed. ?Smokes about a pack and a half of cigarettes a day and has a past medical history of COPD, diabetes mellitus, hyperlipidemia,psoriasis and ?hypothyroidism. ?Readmission: ?04/04/18 patient presents today for a initial evaluation today concerning a right forearm skin tear which has been present for about two weeks. ?Currently need this point has been applied to the wound bed followed by a protective dressing that has been secured by wrapping instead of ?adhesive's.  Currently the patient's wound does seem to be doing better patient and her husband states that the excess skin from the skin tear ?was actually trimmed away prior to starting the wound care they have been performing. With that being said she seems to be doing well there's ?no evidence of infection which is good news and overall the wound seems to be showing signs of good granulation and overall improvement which ?is great news. Fortunately she has no with valid bladder dysfunction unexpected fevers chills or weight loss. This seems to be rather ?straightforward in my opinion in regard to treatment. ?04/12/18 on evaluation today patient appears to be doing very well in regard to her right forearm skin tear. She's been tolerating the dressing ?changes without complication. In general this appears to show signs of great improvement since last time I saw her. Overall I'm very pleased with ?how things are going patient still somewhat scared about the fact that the skin that is healing over appears very fragile I explained that it does ?take time for this to really tough enough even once it closes over. Fortunately there is no evidence of infection. ?04/19/18 on evaluation today patient appears to be doing very well in regard to her right forearm ulcer. This actually appears to be completely ?healed at this point which is good news. There does not appear to be any evidence of infection currently. She still has a little bit of discomfort due ?to some underlying irritation but overall she is doing excellent. ?Readmission: ?02/14/19 on evaluation today patient actually appears to be doing very well all things considering in regard to her right shin ulcer which occurred as ?a result of a picture frame dropping onto the leg last Thursday. This is not been quite a week as of today. Because we couldn't get him today this ?happened she actually did go to urgent care was she with this guy Keflex for week and  also a pressure dressing  was applied. this has caused some ?issues with fluid buildup below and above the dressing unfortunately. Fortunately there does not appear to be signs of infection at this time which ?is excellent news No fevers, chills, nausea, or vomiting noted at this time. ?02/21/19 on evaluation today patient actually appears to be doing much better at this point in regard to her right lower [place] ulcer. She's been ?tolerating the dressing changes without complication. Fortunately there's no evidence of infection at this time. No fevers, chills, nausea, or ?vomiting noted at this time. ?02/28/19 on evaluation today patient actually appears to be doing very well in regard to her lower extremity ulcer. She has been tolerating the ?dressing changes without complication. Fortunately this seems to be doing significantly better which is great news. Overall I'm very pleased with ?the progress. I do believe that the patient is doing excellent overall from the standpoint of her wound although there was some dressing stuck to ?the surface of the wound that did require sharp debridement today. ?Readmission: ?09/13/2020 upon evaluation today patient appears to be doing well all things considered in regard to her right leg skin tear. She has been putting ?some Vaseline on this nothing else at this point. Fortunately there is no signs of active infection which is great news this happened she tells me ?about 4 days ago. She decided to come in and have me take a look at it to ensure nothing else was going on. She does not even remember how ?exactly she did this other than she had some burning/stinging on her leg and she went to rub it causing this to open. ?10/6; follow-up with regards to her right lower extremity anterior skin tear. We put Steri-Strips on this last week. The skin around this especially ?medially is still viable which is good news. The wound is not totally opposed ?10/13; 2-week follow-up. Patient has a right leg skin tear. I  gather the skin did not totally oppose however she has a very healthy small wound on ?the right anterior lower leg. We have been using Xeroform and conform with net ?09/30/2020 upon evaluation today patient appears to actually be doing quite well in regard to her leg ulcer which in fact appears to be very close ?to complete resolution and healing. I am extremely pleased with where things stand and overall I think she will likely be healed in the next 1-2 ?weeks. ?10/10/2020 on evaluation today patient appears to be doing excellent in fact her wound appears to be completely healed which is great news. ?There is no signs of active infection at this time. ?Readmission 02/25/2022 ?Ms. Maicey Barrientez is an 87 year old female with a past medical history of COPD, type 2 diabetes, venous insufficiency/lymphedema and ?hypothyroidism that presents to the clinic for a 31-monthhistory of nonhealing ulcer to the left lower extremity. She reports having a prior biopsy ?to this area that was confirmed to not be a neoplasm. She states that this area often opens and then heals up. This time she has not been able to ?close the wound. She has been using Xeroform. She has not been using her compression stockings. She denies signs of infection. ?MJASSMIN, KEMMERER([id number] ?3/22; patient presents for follow-up. She has no issues or complaints today. She tolerated the compression wrap well. She denies signs of ?infection. ?3/29; patient presents for follow-up. She has no issues or complaints today. ?Electronic Signature(s) ?Signed: 03/11/2022 4:15:51 PM By: HKalman ShanDO ?Entered By: HKalman Shan  on 03/11/2022 15:55:14 ?JACLIN, FINKS ([id number]) ?-------------------------------------------------------------------------------- ?Physical Exam Details ?Patient Name: VICTOR, GRANADOS. ?Date of Service: 03/11/2022 3:30 PM ?Medical Record Number: [id number] ?Patient Account Number: [id number] ?Date of Birth/Sex: Jul 30, 1935 (88 y.o.  F) ?Treating RN: Carlene Coria ?Primary Care Provider: Harrel Lemon Other Clinician: ?Referring Provider: Harrel Lemon ?Treating Provider/Extender: Kalman Shan ?Weeks in Treatment: 2 ?Constitutional ?. ?C

## 2022-03-11 NOTE — Progress Notes (Signed)
MAHOGANY, TORRANCE ([id number]) ?Visit Report for 03/11/2022 ?Arrival Information Details ?Patient Name: [name], [name]. ?Date of Service: 03/11/2022 3:30 PM ?Medical Record Number: [id number] ?Patient Account Number: [id number] ?Date of Birth/Sex: [birth year] (87 y.o. F) ?Treating RN: Carlene Coria ?Primary Care Ota Ebersole: Harrel Lemon Other Clinician: ?Referring Kimmy Totten: Harrel Lemon ?Treating Tacia Hindley/Extender: Kalman Shan ?Weeks in Treatment: 2 ?Visit Information History Since Last Visit ?All ordered tests and consults were completed: No ?Patient Arrived: Ambulatory ?Added or deleted any medications: No ?Arrival Time: 15:37 ?Any new allergies or adverse reactions: No ?Accompanied By: self ?Had a fall or experienced change in No ?Transfer Assistance: None ?activities of daily living that may affect ?Patient Identification Verified: Yes ?risk of falls: ?Secondary Verification Process Completed: Yes ?Signs or symptoms of abuse/neglect since last visito No ?Patient Requires Transmission-Based Precautions: No ?Hospitalized since last visit: No ?Patient Has Alerts: No ?Implantable device outside of the clinic excluding No ?cellular tissue based products placed in the center ?since last visit: ?Has Dressing in Place as Prescribed: Yes ?Has Compression in Place as Prescribed: Yes ?Pain Present Now: No ?Electronic Signature(s) ?Signed: 03/11/2022 4:09:56 PM By: Carlene Coria RN ?Entered By: Carlene Coria on 03/11/2022 15:41:31 ?[name], [name] ([id number]) ?-------------------------------------------------------------------------------- ?Clinic Level of Care Assessment Details ?Patient Name: [name], [name]. ?Date of Service: 03/11/2022 3:30 PM ?Medical Record Number: [id number] ?Patient Account Number: [id number] ?Date of Birth/Sex: Nov 29, 1935 (87 y.o. F) ?Treating RN: Carlene Coria ?Primary Care Reshaun Briseno: Harrel Lemon Other Clinician: ?Referring Tyrell Seifer: Harrel Lemon ?Treating Brier Firebaugh/Extender: Kalman Shan ?Weeks in  Treatment: 2 ?Clinic Level of Care Assessment Items ?TOOL 4 Quantity Score ?X - Use when only an EandM is performed on FOLLOW-UP visit 1 0 ?ASSESSMENTS - Nursing Assessment / Reassessment ?X - Reassessment of Co-morbidities (includes updates in patient status) 1 10 ?X- 1 5 ?Reassessment of Adherence to Treatment Plan ?ASSESSMENTS - Wound and Skin Assessment / Reassessment ?X - Simple Wound Assessment / Reassessment - one wound 1 5 ?'[]'$  - 0 ?Complex Wound Assessment / Reassessment - multiple wounds ?'[]'$  - 0 ?Dermatologic / Skin Assessment (not related to wound area) ?ASSESSMENTS - Focused Assessment ?'[]'$  - Circumferential Edema Measurements - multi extremities 0 ?'[]'$  - 0 ?Nutritional Assessment / Counseling / Intervention ?'[]'$  - 0 ?Lower Extremity Assessment (monofilament, tuning fork, pulses) ?'[]'$  - 0 ?Peripheral Arterial Disease Assessment (using hand held doppler) ?ASSESSMENTS - Ostomy and/or Continence Assessment and Care ?'[]'$  - Incontinence Assessment and Management 0 ?'[]'$  - 0 ?Ostomy Care Assessment and Management (repouching, etc.) ?PROCESS - Coordination of Care ?X - Simple Patient / Family Education for ongoing care 1 15 ?'[]'$  - 0 ?Complex (extensive) Patient / Family Education for ongoing care ?'[]'$  - 0 ?Staff obtains Consents, Records, Test Results / Process Orders ?'[]'$  - 0 ?Staff telephones HHA, Nursing Homes / Clarify orders / etc ?'[]'$  - 0 ?Routine Transfer to another Facility (non-emergent condition) ?'[]'$  - 0 ?Routine Hospital Admission (non-emergent condition) ?'[]'$  - 0 ?New Admissions / Biomedical engineer / Ordering NPWT, Apligraf, etc. ?'[]'$  - 0 ?Emergency Hospital Admission (emergent condition) ?'[]'$  - 0 ?Simple Discharge Coordination ?'[]'$  - 0 ?Complex (extensive) Discharge Coordination ?PROCESS - Special Needs ?'[]'$  - Pediatric / Minor Patient Management 0 ?'[]'$  - 0 ?Isolation Patient Management ?'[]'$  - 0 ?Hearing / Language / Visual special needs ?'[]'$  - 0 ?Assessment of Community assistance (transportation, D/C  planning, etc.) ?'[]'$  - 0 ?Additional assistance / Altered mentation ?'[]'$  - 0 ?Support Surface(s) Assessment (bed, cushion, seat, etc.) ?INTERVENTIONS - Wound Cleansing / Measurement ?[name], [name] ([id number]) ?  X- 1 5 ?Simple Wound Cleansing - one wound ?'[]'$  - 0 ?Complex Wound Cleansing - multiple wounds ?X- 1 5 ?Wound Imaging (photographs - any number of wounds) ?'[]'$  - 0 ?Wound Tracing (instead of photographs) ?X- 1 5 ?Simple Wound Measurement - one wound ?'[]'$  - 0 ?Complex Wound Measurement - multiple wounds ?INTERVENTIONS - Wound Dressings ?'[]'$  - Small Wound Dressing one or multiple wounds 0 ?'[]'$  - 0 ?Medium Wound Dressing one or multiple wounds ?'[]'$  - 0 ?Large Wound Dressing one or multiple wounds ?'[]'$  - 0 ?Application of Medications - topical ?'[]'$  - 0 ?Application of Medications - injection ?INTERVENTIONS - Miscellaneous ?'[]'$  - External ear exam 0 ?'[]'$  - 0 ?Specimen Collection (cultures, biopsies, blood, body fluids, etc.) ?'[]'$  - 0 ?Specimen(s) / Culture(s) sent or taken to Lab for analysis ?'[]'$  - 0 ?Patient Transfer (multiple staff / Civil Service fast streamer / Similar devices) ?'[]'$  - 0 ?Simple Staple / Suture removal (25 or less) ?'[]'$  - 0 ?Complex Staple / Suture removal (26 or more) ?'[]'$  - 0 ?Hypo / Hyperglycemic Management (close monitor of Blood Glucose) ?'[]'$  - 0 ?Ankle / Brachial Index (ABI) - do not check if billed separately ?X- 1 5 ?Vital Signs ?Has the patient been seen at the hospital within the last three years: Yes ?Total Score: 55 ?Level Of Care: New/Established - Level ?2 ?Electronic Signature(s) ?Signed: 03/11/2022 4:09:56 PM By: Carlene Coria RN ?Entered By: Carlene Coria on 03/11/2022 15:56:16 ?[name], [name] ([id number]) ?-------------------------------------------------------------------------------- ?Encounter Discharge Information Details ?Patient Name: [name], [name]. ?Date of Service: 03/11/2022 3:30 PM ?Medical Record Number: [id number] ?Patient Account Number: [id number] ?Date of Birth/Sex: Sep 09, 1935 (87 y.o.  F) ?Treating RN: Carlene Coria ?Primary Care Maximo Spratling: Harrel Lemon Other Clinician: ?Referring Raeley Gilmore: Harrel Lemon ?Treating Hakim Minniefield/Extender: Kalman Shan ?Weeks in Treatment: 2 ?Encounter Discharge Information Items ?Discharge Condition: Stable ?Ambulatory Status: Ambulatory ?Discharge Destination: Home ?Transportation: Private Auto ?Accompanied By: self ?Schedule Follow-up Appointment: Yes ?Clinical Summary of Care: Patient Declined ?Electronic Signature(s) ?Signed: 03/11/2022 3:58:24 PM By: Carlene Coria RN ?Entered ByCarlene Coria on 03/11/2022 15:58:24 ?[name], [name] ([id number]) ?-------------------------------------------------------------------------------- ?Lower Extremity Assessment Details ?Patient Name: [name], [name]. ?Date of Service: 03/11/2022 3:30 PM ?Medical Record Number: [id number] ?Patient Account Number: [id number] ?Date of Birth/Sex: 29-May-1935 (87 y.o. F) ?Treating RN: Carlene Coria ?Primary Care Charae Depaolis: Harrel Lemon Other Clinician: ?Referring Gerica Koble: Harrel Lemon ?Treating Keyandre Pileggi/Extender: Kalman Shan ?Weeks in Treatment: 2 ?Edema Assessment ?Assessed: [Left: No] [Right: No] ?[Left: Edema] [Right: :] ?Calf ?Left: Right: ?Point of Measurement: 30 cm From Medial Instep 30 cm ?Ankle ?Left: Right: ?Point of Measurement: 11 cm From Medial Instep 20 cm ?Electronic Signature(s) ?Signed: 03/11/2022 4:09:56 PM By: Carlene Coria RN ?Entered ByCarlene Coria on 03/11/2022 15:49:42 ?[name], [name] ([id number]) ?-------------------------------------------------------------------------------- ?Multi Wound Chart Details ?Patient Name: [name], [name]. ?Date of Service: 03/11/2022 3:30 PM ?Medical Record Number: [id number] ?Patient Account Number: [id number] ?Date of Birth/Sex: 02/08/1935 (87 y.o. F) ?Treating RN: Carlene Coria ?Primary Care Brookelle Pellicane: Harrel Lemon Other Clinician: ?Referring Kelijah Towry: Harrel Lemon ?Treating Orissa Arreaga/Extender: Kalman Shan ?Weeks in Treatment:  2 ?Vital Signs ?Height(in): 64 ?Pulse(bpm): 99 ?Weight(lbs): 140 ?Blood Pressure(mmHg): 140/74 ?Body Mass Index(BMI): 24 ?Temperature(??F): 97.6 ?Respiratory Rate(breaths/min): 18 ?Photos: [N/A:N/A] ?Wound Location: Left,

## 2022-03-18 ENCOUNTER — Ambulatory Visit: Payer: Medicare Other | Admitting: Internal Medicine

## 2022-03-23 ENCOUNTER — Ambulatory Visit: Payer: Medicare Other | Admitting: Dermatology

## 2022-03-25 ENCOUNTER — Ambulatory Visit: Payer: Medicare Other | Admitting: Internal Medicine

## 2022-04-01 ENCOUNTER — Ambulatory Visit: Payer: Medicare Other | Admitting: Internal Medicine

## 2022-04-08 ENCOUNTER — Ambulatory Visit: Payer: Medicare Other | Admitting: Internal Medicine

## 2022-04-13 ENCOUNTER — Ambulatory Visit: Payer: Medicare Other | Admitting: Dermatology

## 2022-04-13 DIAGNOSIS — L853 Xerosis cutis: Secondary | ICD-10-CM | POA: Diagnosis not present

## 2022-04-13 DIAGNOSIS — Q828 Other specified congenital malformations of skin: Secondary | ICD-10-CM | POA: Diagnosis not present

## 2022-04-13 DIAGNOSIS — L409 Psoriasis, unspecified: Secondary | ICD-10-CM | POA: Diagnosis not present

## 2022-04-13 DIAGNOSIS — Z79899 Other long term (current) drug therapy: Secondary | ICD-10-CM | POA: Diagnosis not present

## 2022-04-13 MED ORDER — OTEZLA 30 MG PO TABS: 30.0000 mg | ORAL_TABLET | Freq: Two times a day (BID) | ORAL | 6 refills | Status: DC

## 2022-04-13 NOTE — Progress Notes (Signed)
? ?  Follow-Up Visit ?  ?Subjective  ?[name] is a 87 y.o. 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   Patient states area became infected several weeks ago and went to wound care center for treatment. Area was wrapped 2 times and told area had healed. Patient is concerned area is not completely healed. ). ? ?The following portions of the chart were reviewed this encounter and updated as appropriate:  Tobacco  Allergies  Meds  Problems  Med Hx  Surg Hx  Fam Hx   ?  ?Review of Systems: No other skin or systemic complaints except as noted in HPI or Assessment and Plan. ? ?Objective  ?Well appearing patient in no apparent distress; mood and affect are within normal limits. ? ?A focused examination was performed including bilateral feet, left lower leg . Relevant physical exam findings are noted in the Assessment and Plan. ? ?left distal pretibial ?0.7 cm hyperkeratotic patch at left distal pretibial  ? ? ? ? ? ? ?b/l feet ?No scale ? ? ?Assessment & Plan  ?Porokeratosis ?left distal pretibial ?Bx proven superficial actinic porokeratosis  ?See photos ?Discussed benign rough growth associated with sun exposure. No treatment necessary, but can discuss treatment or surgical removal if desired if patient is bothered by area.  ?  ?Patient deferred treatment at this time. ? ?Psoriasis ?b/l feet ?Psoriasis -severe mostly of the feet but currently controlled on oral Otezla.  No side effects to [place]. ?Psoriasis is a chronic non-curable, but treatable genetic/hereditary disease that may have other systemic features affecting other organ systems such as joints (Psoriatic Arthritis). It is associated with an increased risk of inflammatory bowel disease, heart disease, non-alcoholic fatty liver disease, and depression.   ?  ?Continue Otezla '30mg'$  twice daily ?Currently getting through patient assistance program which is good until end of this year  ?Rx  sent to [facility], [place] ?Patient will need to reapply for re assistance for Otezla at next follow up.  ?Side effects of Otezla (apremilast) include diarrhea, nausea, headache, upper respiratory infection, depression, and weight decrease (5-10%). It should only be taken by pregnant women after a discussion regarding risks and benefits with their doctor. Goal is control of skin condition, not cure.  The use of [place] Nail requires long term medication management, including periodic office visits. ? ?Apremilast (OTEZLA) 30 MG TABS - b/l feet ?Take 1 tablet (30 mg total) by mouth 2 (two) times daily. ? ?Xerosis ?- diffuse xerotic patches ?- recommend gentle, hydrating skin care ?- gentle skin care handout given ? ?Return in about 6 months (around 10/14/2022) for psoriasis follow up. ?I, Ruthell Rummage, CMA, am acting as scribe for Sarina Ser, MD. ?Documentation: I have reviewed the above documentation for accuracy and completeness, and I agree with the above. ? ?Sarina Ser, MD ? ?

## 2022-04-13 NOTE — Patient Instructions (Addendum)
? ? ? ?Gentle Skin Care Guide ? ?1. Bathe no more than once a day. ? ?2. Avoid bathing in hot water ? ?3. Use a mild soap like Dove, Vanicream, Cetaphil, CeraVe. Can use Lever 2000 or Cetaphil antibacterial soap ? ?4. Use soap only where you need it. On most days, use it under your arms, between your legs, and on your feet. Let the water rinse other areas unless visibly dirty. ? ?5. When you get out of the bath/shower, use a towel to gently blot your skin dry, don't rub it. ? ?6. While your skin is still a little damp, apply a moisturizing cream such as Vanicream, CeraVe, Cetaphil, Eucerin, Sarna lotion or plain Vaseline Jelly. For hands apply Neutrogena [place] Hand Cream or Excipial Hand Cream. ? ?7. Reapply moisturizer any time you start to itch or feel dry. ? ?8. Sometimes using free and clear laundry detergents can be helpful. Fabric softener sheets should be avoided. Downy Free & Gentle liquid, or any liquid fabric softener that is free of dyes and perfumes, it acceptable to use ? ?9. If your doctor has given you prescription creams you may apply moisturizers over them  ? ? ? ? ? ? ?If You Need Anything After Your Visit ? ?If you have any questions or concerns for your doctor, please call our main line at [phone] and press option 4 to reach your doctor's medical assistant. If no one answers, please leave a voicemail as directed and we will return your call as soon as possible. Messages left after 4 pm will be answered the following business day.  ? ?You may also send [place] a message via MyChart. We typically respond to MyChart messages within 1-2 business days. ? ?For prescription refills, please ask your pharmacy to contact our office. Our fax number is [phone]. ? ?If you have an urgent issue when the clinic is closed that cannot wait until the next business day, you can page your doctor at the number below.   ? ?Please note that while we do our best to be available for urgent issues outside of  office hours, we are not available 24/7.  ? ?If you have an urgent issue and are unable to reach [place], you may choose to seek medical care at your doctor's office, retail clinic, urgent care center, or emergency room. ? ?If you have a medical emergency, please immediately call 911 or go to the emergency department. ? ?Pager Numbers ? ?- Dr. Nehemiah Massed: [phone] ? ?- Dr. Laurence Ferrari: [phone] ? ?- Dr. Nicole Kindred: [phone] ? ?In the event of inclement weather, please call our main line at [phone] for an update on the status of any delays or closures. ? ?Dermatology Medication Tips: ?Please keep the boxes that topical medications come in in order to help keep track of the instructions about where and how to use these. Pharmacies typically print the medication instructions only on the boxes and not directly on the medication tubes.  ? ?If your medication is too expensive, please contact our office at [phone] option 4 or send [place] a message through [place].  ? ?We are unable to tell what your co-pay for medications will be in advance as this is different depending on your insurance coverage. However, we may be able to find a substitute medication at lower cost or fill out paperwork to get insurance to cover a needed medication.  ? ?If a prior authorization is required to get your medication covered by your insurance company, please allow  [place] 1-2 business days to complete this process. ? ?Drug prices often vary depending on where the prescription is filled and some pharmacies may offer cheaper prices. ? ?The website www.goodrx.com contains coupons for medications through different pharmacies. The prices here do not account for what the cost may be with help from insurance (it may be cheaper with your insurance), but the website can give you the price if you did not use any insurance.  ?- You can print the associated coupon and take it with your prescription to the pharmacy.  ?- You may also stop by our office during  regular business hours and pick up a GoodRx coupon card.  ?- If you need your prescription sent electronically to a different pharmacy, notify our office through [facility] or by phone at [phone] option 4. ? ? ? ? ?Si Usted Necesita Algo Despu?s de Su Visita ? ?Tambi?n puede enviarnos un mensaje a trav?s de MyChart. Por lo general respondemos a los mensajes de MyChart en el transcurso de 1 a 2 d?as h?biles. ? ?Para renovar recetas, por favor pida a su farmacia que se ponga en contacto con nuestra oficina. Nuestro n?mero de fax es el [phone]. ? ?Si tiene un asunto urgente cuando la cl?nica est? cerrada y que no puede esperar hasta el siguiente d?a h?bil, puede llamar/localizar a su doctor(a) al n?mero que aparece a continuaci?n.  ? ?Por favor, tenga en cuenta que aunque hacemos todo lo posible para estar disponibles para asuntos urgentes fuera del horario de oficina, no estamos disponibles las 24 horas del d?a, los 7 d?as de la semana.  ? ?Si tiene un problema urgente y no puede comunicarse con nosotros, puede optar por buscar atenci?n m?dica  en el consultorio de su doctor(a), en una cl?nica privada, en un centro de atenci?n urgente o en una sala de emergencias. ? ?Si tiene Engineer, maintenance (IT) m?dica, por favor llame inmediatamente al 911 o vaya a la sala de emergencias. ? ?N?meros de b?per ? ?- Dr. Nehemiah Massed: [phone] ? ?- Dra. Moye: [phone] ? ?- Dra. Nicole Kindred: [phone] ? ?En caso de inclemencias del tiempo, por favor llame a nuestra l?nea principal al [phone] para una actualizaci?n sobre el estado de cualquier retraso o cierre. ? ?Consejos para la medicaci?n en dermatolog?a: ?Por favor, guarde las cajas en las que vienen los medicamentos de uso t?pico para ayudarle a seguir las instrucciones sobre d?nde y c?mo usarlos. Las farmacias generalmente imprimen las instrucciones del medicamento s?lo en las cajas y no directamente en los tubos del [place].  ? ?Si su medicamento es muy  caro, por favor, p?ngase en contacto con Zigmund Daniel llamando al [phone] y presione la opci?n 4 o env?enos un mensaje a trav?s de MyChart.  ? ?No podemos decirle cu?l ser? su copago por los medicamentos por adelantado ya que esto es diferente dependiendo de la cobertura de su seguro. Sin embargo, es posible que podamos encontrar un medicamento sustituto a Electrical engineer un formulario para que el seguro cubra el medicamento que se considera necesario.  ? ?Si se requiere Ardelia Mems autorizaci?n previa para que su compa??a de seguros [place] su medicamento, por favor perm?tanos de 1 a 2 d?as h?biles para completar este proceso. ? ?Los precios de los medicamentos var?an con frecuencia dependiendo del Environmental consultant de d?nde se surte la receta y alguna farmacias pueden ofrecer precios m?s baratos. ? ?El sitio web www.goodrx.com tiene cupones para medicamentos de Airline pilot. Los precios aqu? no tienen en cuenta lo que podr?a  costar con la ayuda del seguro (puede ser m?s barato con su seguro), pero el sitio web puede darle el precio si no utiliz? ning?n seguro.  ?- Puede imprimir el cup?n correspondiente y llevarlo con su receta a la farmacia.  ?- Tambi?n puede pasar por nuestra oficina durante el horario de atenci?n regular y recoger una tarjeta de cupones de GoodRx.  ?- Si necesita que su receta se env?e electr?nicamente a Chiropodist, informe a nuestra oficina a trav?s de MyChart de [place] o por tel?fono llamando al [phone] y presione la opci?n 4. ? ?

## 2022-04-21 ENCOUNTER — Encounter: Payer: Self-pay | Admitting: Dermatology

## 2022-09-15 ENCOUNTER — Other Ambulatory Visit: Payer: Self-pay

## 2022-09-15 ENCOUNTER — Telehealth: Payer: Self-pay

## 2022-09-15 MED ORDER — CLOBETASOL PROPIONATE 0.05 % EX GEL: CUTANEOUS | 1 refills | Status: DC

## 2022-09-15 NOTE — Progress Notes (Signed)
Clobetasol Gel 0.05% refill escripted to [facility].

## 2022-09-15 NOTE — Telephone Encounter (Signed)
Patients husband called for a refill of patients Clobetasol Gel 0.05%. Patient was sent in the cream the last time and she prefers the gel. Okay to send in?

## 2022-10-07 ENCOUNTER — Ambulatory Visit: Payer: Medicare Other | Admitting: Dermatology

## 2022-10-07 DIAGNOSIS — Z79899 Other long term (current) drug therapy: Secondary | ICD-10-CM

## 2022-10-07 DIAGNOSIS — L4050 Arthropathic psoriasis, unspecified: Secondary | ICD-10-CM | POA: Diagnosis not present

## 2022-10-07 DIAGNOSIS — T148XXA Other injury of unspecified body region, initial encounter: Secondary | ICD-10-CM

## 2022-10-07 DIAGNOSIS — L409 Psoriasis, unspecified: Secondary | ICD-10-CM

## 2022-10-07 DIAGNOSIS — S40811A Abrasion of right upper arm, initial encounter: Secondary | ICD-10-CM

## 2022-10-07 DIAGNOSIS — L720 Epidermal cyst: Secondary | ICD-10-CM

## 2022-10-07 MED ORDER — OTEZLA 30 MG PO TABS: 30.0000 mg | ORAL_TABLET | Freq: Two times a day (BID) | ORAL | 5 refills | Status: DC

## 2022-10-07 MED ORDER — MUPIROCIN 2 % EX OINT: TOPICAL_OINTMENT | CUTANEOUS | 0 refills | Status: DC

## 2022-10-07 NOTE — Patient Instructions (Signed)
Due to recent changes in healthcare laws, you may see results of your pathology and/or laboratory studies on MyChart before the doctors have had a chance to review them. We understand that in some cases there may be results that are confusing or concerning to you. Please understand that not all results are received at the same time and often the doctors may need to interpret multiple results in order to provide you with the best plan of care or course of treatment. Therefore, we ask that you please give us 2 business days to thoroughly review all your results before contacting the office for clarification. Should we see a critical lab result, you will be contacted sooner.   If You Need Anything After Your Visit  If you have any questions or concerns for your doctor, please call our main line at [phone] and press option 4 to reach your doctor's medical assistant. If no one answers, please leave a voicemail as directed and we will return your call as soon as possible. Messages left after 4 pm will be answered the following business day.   You may also send us a message via MyChart. We typically respond to MyChart messages within 1-2 business days.  For prescription refills, please ask your pharmacy to contact our office. Our fax number is [phone].  If you have an urgent issue when the clinic is closed that cannot wait until the next business day, you can page your doctor at the number below.    Please note that while we do our best to be available for urgent issues outside of office hours, we are not available 24/7.   If you have an urgent issue and are unable to reach us, you may choose to seek medical care at your doctor's office, retail clinic, urgent care center, or emergency room.  If you have a medical emergency, please immediately call 911 or go to the emergency department.  Pager Numbers  - Dr. Kowalski: [phone]  - Dr. Moye: [phone]  - Dr. Stewart:  [phone]  In the event of inclement weather, please call our main line at [phone] for an update on the status of any delays or closures.  Dermatology Medication Tips: Please keep the boxes that topical medications come in in order to help keep track of the instructions about where and how to use these. Pharmacies typically print the medication instructions only on the boxes and not directly on the medication tubes.   If your medication is too expensive, please contact our office at [phone] option 4 or send us a message through MyChart.   We are unable to tell what your co-pay for medications will be in advance as this is different depending on your insurance coverage. However, we may be able to find a substitute medication at lower cost or fill out paperwork to get insurance to cover a needed medication.   If a prior authorization is required to get your medication covered by your insurance company, please allow us 1-2 business days to complete this process.  Drug prices often vary depending on where the prescription is filled and some pharmacies may offer cheaper prices.  The website www.goodrx.com contains coupons for medications through different pharmacies. The prices here do not account for what the cost may be with help from insurance (it may be cheaper with your insurance), but the website can give you the price if you did not use any insurance.  - You can print the associated coupon and take it with   your prescription to the pharmacy.  - You may also stop by our office during regular business hours and pick up a GoodRx coupon card.  - If you need your prescription sent electronically to a different pharmacy, notify our office through [facility] MyChart or by phone at [phone] option 4.     Si Usted Necesita Algo Despus de Su Visita  Tambin puede enviarnos un mensaje a travs de MyChart. Por lo general respondemos a los mensajes de MyChart en el transcurso de 1 a 2  das hbiles.  Para renovar recetas, por favor pida a su farmacia que se ponga en contacto con nuestra oficina. Nuestro nmero de fax es el [phone].  Si tiene un asunto urgente cuando la clnica est cerrada y que no puede esperar hasta el siguiente da hbil, puede llamar/localizar a su doctor(a) al nmero que aparece a continuacin.   Por favor, tenga en cuenta que aunque hacemos todo lo posible para estar disponibles para asuntos urgentes fuera del horario de oficina, no estamos disponibles las 24 horas del da, los 7 das de la semana.   Si tiene un problema urgente y no puede comunicarse con nosotros, puede optar por buscar atencin mdica  en el consultorio de su doctor(a), en una clnica privada, en un centro de atencin urgente o en una sala de emergencias.  Si tiene una emergencia mdica, por favor llame inmediatamente al 911 o vaya a la sala de emergencias.  Nmeros de bper  - Dr. Kowalski: [phone]  - Dra. Moye: [phone]  - Dra. Stewart: [phone]  En caso de inclemencias del tiempo, por favor llame a nuestra lnea principal al [phone] para una actualizacin sobre el estado de cualquier retraso o cierre.  Consejos para la medicacin en dermatologa: Por favor, guarde las cajas en las que vienen los medicamentos de uso tpico para ayudarle a seguir las instrucciones sobre dnde y cmo usarlos. Las farmacias generalmente imprimen las instrucciones del medicamento slo en las cajas y no directamente en los tubos del medicamento.   Si su medicamento es muy caro, por favor, pngase en contacto con nuestra oficina llamando al [phone] y presione la opcin 4 o envenos un mensaje a travs de MyChart.   No podemos decirle cul ser su copago por los medicamentos por adelantado ya que esto es diferente dependiendo de la cobertura de su seguro. Sin embargo, es posible que podamos encontrar un medicamento sustituto a menor costo o llenar un formulario para que el  seguro cubra el medicamento que se considera necesario.   Si se requiere una autorizacin previa para que su compaa de seguros cubra su medicamento, por favor permtanos de 1 a 2 das hbiles para completar este proceso.  Los precios de los medicamentos varan con frecuencia dependiendo del lugar de dnde se surte la receta y alguna farmacias pueden ofrecer precios ms baratos.  El sitio web www.goodrx.com tiene cupones para medicamentos de diferentes farmacias. Los precios aqu no tienen en cuenta lo que podra costar con la ayuda del seguro (puede ser ms barato con su seguro), pero el sitio web puede darle el precio si no utiliz ningn seguro.  - Puede imprimir el cupn correspondiente y llevarlo con su receta a la farmacia.  - Tambin puede pasar por nuestra oficina durante el horario de atencin regular y recoger una tarjeta de cupones de GoodRx.  - Si necesita que su receta se enve electrnicamente a una farmacia diferente, informe a nuestra oficina a travs de MyChart de    o por telfono llamando al [phone] y presione la opcin 4.  

## 2022-10-07 NOTE — Progress Notes (Signed)
   Follow-Up Visit   Subjective  [name] is a 87 y.o. female who presents for the following: Psoriasis (5 months f/u on Psoriasis, patient taking Otezla mg twice a day ). Patient c/o joint pain in her hands and fingers. Patient report she fell and bruised her right arm a few weeks ago she would like this area checked today.  The following portions of the chart were reviewed this encounter and updated as appropriate:   Tobacco  Allergies  Meds  Problems  Med Hx  Surg Hx  Fam Hx     Review of Systems:  No other skin or systemic complaints except as noted in HPI or Assessment and Plan.  Objective  Well appearing patient in no apparent distress; mood and affect are within normal limits.  A focused examination was performed including b/l feet . Relevant physical exam findings are noted in the Assessment and Plan.  right arm Abrasions   feet Mild pink patches   left preauricular 0.8 cm Subcutaneous nodule.   upper back Subcutaneous nodule.    Assessment & Plan  Abrasion right arm Abrasions healing  Start Mupirocin ointment qd-bid to open sores Avoid band aids, recommend wrapping are with coban wraps  Related Medications mupirocin ointment (BACTROBAN) 2 % Apply to open sores  qd-bid  Psoriasis feet Psoriasis/Psoriatic arthritis  Psoriasis is a chronic non-curable, but treatable genetic/hereditary disease that may have other systemic features affecting other organ systems such as joints (Psoriatic Arthritis). It is associated with an increased risk of inflammatory bowel disease, heart disease, non-alcoholic fatty liver disease, and depression.     Patient decline referral to rheumatologist today for evaluation of psoriatic arthritis that is not under good control.  Continue Otezla 30 mg 2 times a day  Chronic and persistent condition with duration or expected duration over one year. Condition is symptomatic / bothersome to patient. Not to goal, but doing much  better on Otezla with skin mostly clear today.  Related Medications Apremilast (OTEZLA) 30 MG TABS Take 1 tablet (30 mg total) by mouth 2 (two) times daily.  Apremilast (OTEZLA) 30 MG TABS Take 1 tablet (30 mg total) by mouth 2 (two) times daily.  Epidermal inclusion cyst left preauricular Benign-appearing. Exam most consistent with an epidermal inclusion cyst. Discussed that a cyst is a benign growth that can grow over time and sometimes get irritated or inflamed. Recommend observation if it is not bothersome. Discussed option of surgical excision to remove it if it is growing, symptomatic, or other changes noted. Please call for new or changing lesions so they can be evaluated.   Epidermoid cyst upper back Recurrent cyst after I&D Benign-appearing. Exam most consistent with an epidermal inclusion cyst. Discussed that a cyst is a benign growth that can grow over time and sometimes get irritated or inflamed. Recommend observation if it is not bothersome. Discussed option of surgical excision to remove it if it is growing, symptomatic, or other changes noted. Please call for new or changing lesions so they can be evaluated.   Return in about 6 months (around 04/08/2023) for Psoriasis .  IMarye Round, CMA, am acting as scribe for Sarina Ser, MD .  Documentation: I have reviewed the above documentation for accuracy and completeness, and I agree with the above.  Sarina Ser, MD

## 2022-10-12 ENCOUNTER — Encounter (INDEPENDENT_AMBULATORY_CARE_PROVIDER_SITE_OTHER): Payer: Self-pay

## 2022-10-12 ENCOUNTER — Encounter: Payer: Self-pay | Admitting: Dermatology

## 2022-10-15 ENCOUNTER — Ambulatory Visit: Payer: Medicare Other | Admitting: Dermatology

## 2023-03-26 ENCOUNTER — Other Ambulatory Visit: Payer: Self-pay | Admitting: Internal Medicine

## 2023-03-26 DIAGNOSIS — R9389 Abnormal findings on diagnostic imaging of other specified body structures: Secondary | ICD-10-CM

## 2023-04-07 ENCOUNTER — Ambulatory Visit
Admission: RE | Admit: 2023-04-07 | Discharge: 2023-04-07 | Disposition: A | Discharge status: Home / Self Care | Payer: Medicare Other | Source: Ambulatory Visit | Attending: Internal Medicine | Admitting: Internal Medicine

## 2023-04-07 DIAGNOSIS — R9389 Abnormal findings on diagnostic imaging of other specified body structures: Secondary | ICD-10-CM | POA: Diagnosis present

## 2023-04-07 MED ORDER — IOHEXOL 300 MG/ML  SOLN
75.0000 mL | Freq: Once | INTRAMUSCULAR | Status: AC | PRN
  Administered 2023-04-07: 75 mL via INTRAVENOUS

## 2023-04-12 ENCOUNTER — Telehealth: Payer: Self-pay

## 2023-04-12 NOTE — Telephone Encounter (Signed)
Introductory call made to patient prior to her new patient appointment with Dr. Cathie Hoops on 04/12/23. I introduced myself to patient and asked if she knew where clinic is and if she had any questions about appointment. Patient stated that [facility] where [facility] and she did not have any questions or concerns.

## 2023-04-13 ENCOUNTER — Ambulatory Visit: Payer: Medicare Other | Admitting: Dermatology

## 2023-04-13 ENCOUNTER — Inpatient Hospital Stay: Payer: Medicare Other | Admitting: Oncology

## 2023-04-13 ENCOUNTER — Encounter: Payer: Self-pay | Admitting: Oncology

## 2023-04-13 ENCOUNTER — Inpatient Hospital Stay: Payer: Medicare Other | Attending: Oncology

## 2023-04-13 VITALS — BP 117/76 | HR 100 | Temp 96.0°F | Resp 18 | Wt 120.6 lb

## 2023-04-13 DIAGNOSIS — N6310 Unspecified lump in the right breast, unspecified quadrant: Secondary | ICD-10-CM | POA: Insufficient documentation

## 2023-04-13 DIAGNOSIS — F17210 Nicotine dependence, cigarettes, uncomplicated: Secondary | ICD-10-CM | POA: Insufficient documentation

## 2023-04-13 DIAGNOSIS — R918 Other nonspecific abnormal finding of lung field: Secondary | ICD-10-CM | POA: Insufficient documentation

## 2023-04-13 DIAGNOSIS — Z79899 Other long term (current) drug therapy: Secondary | ICD-10-CM | POA: Diagnosis not present

## 2023-04-13 DIAGNOSIS — Z853 Personal history of malignant neoplasm of breast: Secondary | ICD-10-CM

## 2023-04-13 DIAGNOSIS — N6315 Unspecified lump in the right breast, overlapping quadrants: Secondary | ICD-10-CM

## 2023-04-13 DIAGNOSIS — Z7982 Long term (current) use of aspirin: Secondary | ICD-10-CM | POA: Diagnosis not present

## 2023-04-13 DIAGNOSIS — Z7984 Long term (current) use of oral hypoglycemic drugs: Secondary | ICD-10-CM | POA: Insufficient documentation

## 2023-04-13 DIAGNOSIS — Z923 Personal history of irradiation: Secondary | ICD-10-CM | POA: Diagnosis not present

## 2023-04-13 NOTE — Progress Notes (Signed)
Hematology/Oncology Consult Note [phone] [phone]     REFERRING PROVIDER: Gracelyn Nurse, MD    CHIEF COMPLAINTS/PURPOSE OF CONSULTATION:  Lung mass  ASSESSMENT & PLAN:   Lung mass Imaging was reviewed and discussed with the patient and her husband. Right upper lobe large mass with hilar, mediastinal and right supraclavicular lymph nodes. Differential diagnosis includes recurrent breast cancer to lung versus primary lung cancer I recommend PET scan for further evaluation.  I discussed about the necessity of biopsy to establish tissue diagnosis. Patient is undecided.  She is leaning towards not to have aggressive treatments if cancer is diagnosed. After lengthy discussion explaining, patient and husband are interested in getting a PET scan done for further evaluation.  Will have another discussion after the PET scan for biopsy.  Breast mass, right Pending PET scan evaluation. We just about the possibility of additional mammogram, breast mass biopsy.  Patient is undecided.   No orders of the defined types were placed in this encounter.  Follow-up few days after PET scan for further discussion of management plan All questions were answered. The patient knows to call the clinic with any problems, questions or concerns.  Rickard Patience, MD, PhD [facility] Hematology Oncology 04/13/2023    HISTORY OF PRESENTING ILLNESS:  DEIONNA [name] 88 y.o. female presents to establish care for lung mass.  Patient recently followed up with primary care provider.  She has experienced hot and cold spells, chest x-ray showed possible pneumonia.  Patient was treated with a course of pneumonia.  CT scan was obtained for further evaluation. 04/07/2023 CT chest with contrast showed 1. Irregular mass of the right upper lobe with extension into the anterior right chest wall and mediastinal fat, favor primary lung malignancy, although breast malignancy is also a consideration  given associated finding of asymmetric irregular soft tissue of the right breast. 2. Enhancing asymmetric irregular soft tissue of the right breast, possibly primary breast malignancy or metastatic disease. Could be further evaluated with mammography and breast ultrasound.  3. Prominent subcentimeter right hilar, mediastinal, and right supraclavicular lymph nodes, concerning for metastatic disease. 4. Subtle increased lucency of the L2 vertebral body, concerning for osseous metastatic disease. 5. Trace pericardial effusion. 6. Aortic Atherosclerosis  Patient presents to establish care.  She was accompanied by her husband. She has had lost weight unintentionally.  Appetite has not been good.  Denies night sweats or fever, cough, shortness of breath. She is a current everyday smoker.  2016, she has a remote right breast cancer status post lumpectomy and radiation. pT1b pN0, ER90% PR 90% HER 1+.  Patient declined adjuvant endocrine therapy Her last mammogram was in November 2018.  She has elected to stop mammogram due to age.  She has noticed a right breast mass for about a year,   Patient has chronic hearing loss.  Part of the history was obtained from husband.    MEDICAL HISTORY:  Past Medical History:  Diagnosis Date   Breast cancer (HCC) 2016   Right- radiation   COPD (chronic obstructive pulmonary disease) (HCC)    Dermatitis 11/15/2019   Interface dermatitis, Bx proven.   Diabetes mellitus without complication (HCC)    Hyperlipidemia    Hypothyroidism    Osteoporosis    Psoriasis    Substance abuse (HCC)    tobacco   Thyroid disease    Vertigo    x1, several yrs ago   Wears dentures    partial upper    SURGICAL HISTORY:  Past Surgical History:  Procedure Laterality Date   APPENDECTOMY     BREAST BIOPSY Right 2002   neg   BREAST EXCISIONAL BIOPSY Right 02/05/2015   lumpectomy + radation   CATARACT EXTRACTION W/PHACO Left 06/28/2017   Procedure: CATARACT EXTRACTION  PHACO AND INTRAOCULAR LENS PLACEMENT (IOC)  Left Diabetic;  Surgeon: Nevada Crane, MD;  Location: [facility];  Service: Ophthalmology;  Laterality: Left;  Diabetic - oral meds   CATARACT EXTRACTION W/PHACO Right 08/02/2017   Procedure: CATARACT EXTRACTION PHACO AND INTRAOCULAR LENS PLACEMENT (IOC)RIGHT DIABETIC;  Surgeon: Nevada Crane, MD;  Location: [facility];  Service: Ophthalmology;  Laterality: Right;  DIABETIC WANTS TO BE AFTER 8   CESAREAN SECTION     CHOLECYSTECTOMY     COLONOSCOPY WITH PROPOFOL N/A 03/05/2016   Procedure: COLONOSCOPY WITH PROPOFOL;  Surgeon: Scot Jun, MD;  Location: [facility];  Service: Endoscopy;  Laterality: N/A;   ESOPHAGOGASTRODUODENOSCOPY (EGD) WITH PROPOFOL N/A 03/05/2016   Procedure: ESOPHAGOGASTRODUODENOSCOPY (EGD) WITH PROPOFOL;  Surgeon: Scot Jun, MD;  Location: [facility];  Service: Endoscopy;  Laterality: N/A;   TONSILLECTOMY      SOCIAL HISTORY: Social History   Socioeconomic History   Marital status: Married    Spouse name: Not on file   Number of children: Not on file   Years of education: Not on file   Highest education level: Not on file  Occupational History   Not on file  Tobacco Use   Smoking status: Every Day    Packs/day: 1.00    Years: 50.00    Additional pack years: 0.00    Total pack years: 50.00    Types: Cigarettes   Smokeless tobacco: Never  Substance and Sexual Activity   Alcohol use: No   Drug use: No   Sexual activity: Not on file  Other Topics Concern   Not on file  Social History Narrative   Not on file   Social Determinants of Health   Financial Resource Strain: Low Risk  (04/13/2023)   Overall Financial Resource Strain (CARDIA)    Difficulty of Paying Living Expenses: Not very hard  Food Insecurity: No Food Insecurity (04/13/2023)   Hunger Vital Sign    Worried About Running Out of Food in the Last Year: Never true    Ran Out of Food in the Last Year: Never  true  Transportation Needs: No Transportation Needs (04/13/2023)   PRAPARE - Administrator, Civil Service (Medical): No    Lack of Transportation (Non-Medical): No  Physical Activity: Not on file  Stress: No Stress Concern Present (04/13/2023)   [employer] of Occupational Health - Occupational Stress Questionnaire    Feeling of Stress : Only a little  Social Connections: Not on file  Intimate Partner Violence: Not At Risk (04/13/2023)   Humiliation, Afraid, Rape, and Kick questionnaire    Fear of Current or Ex-Partner: No    Emotionally Abused: No    Physically Abused: No    Sexually Abused: No    FAMILY HISTORY: Family History  Problem Relation Age of Onset   Other Mother    Tuberculosis Father    Diabetes Paternal Grandmother     ALLERGIES:  is allergic to statins, atorvastatin, codeine, septra [sulfamethoxazole-trimethoprim], and sulfa antibiotics.  MEDICATIONS:  Current Outpatient Medications  Medication Sig Dispense Refill   acetaminophen (TYLENOL) 650 MG CR tablet Take by mouth.     Apremilast (OTEZLA) 30 MG TABS Take 1 tablet (30  mg total) by mouth 2 (two) times daily. 60 tablet 5   aspirin 81 MG tablet Take 81 mg by mouth daily.     clobetasol (TEMOVATE) 0.05 % GEL Apply topically up to twice daily as needed. 60 g 1   clobetasol cream (TEMOVATE) 0.05 % APPLY TO AFFECTED AREA (RASH) DAILY UNTIL CLEAR. AVOID FACE, GROIN, AXILLA 60 g 0   diphenhydramine-acetaminophen (TYLENOL PM) 25-500 MG TABS tablet Take 1 tablet by mouth at bedtime as needed (sleep).      ferrous sulfate 325 (65 FE) MG tablet Take by mouth.     glipiZIDE (GLUCOTROL XL) 10 MG 24 hr tablet Take 10 mg by mouth 2 (two) times daily.     levothyroxine (SYNTHROID, LEVOTHROID) 137 MCG tablet Take 137 mcg by mouth daily before breakfast.     LORazepam (ATIVAN) 0.5 MG tablet Take 0.5 mg by mouth every 8 (eight) hours.     metFORMIN (GLUCOPHAGE) 1000 MG tablet Take 1,000 mg by mouth 2 (two) times  daily with a meal.     mupirocin ointment (BACTROBAN) 2 % Apply to open sores  qd-bid 22 g 0   omeprazole (PRILOSEC) 20 MG capsule Take 20 mg by mouth daily.     simvastatin (ZOCOR) 40 MG tablet Take 40 mg by mouth daily.     Ascorbic Acid (VITAMIN C PO) Take 500 mg by mouth daily. (Patient not taking: Reported on 04/03/2021)     Ruxolitinib Phosphate (OPZELURA) 1.5 % CREA Apply 1 application topically daily. Apply 1-2 times daily as needed for itch/rash. (Patient not taking: Reported on 04/13/2023) 60 g 0   No current facility-administered medications for this visit.    Review of Systems  Constitutional:  Positive for appetite change, fatigue and unexpected weight change. Negative for chills and fever.  HENT:   Positive for hearing loss. Negative for voice change.   Eyes:  Negative for eye problems.  Respiratory:  Negative for chest tightness and cough.   Cardiovascular:  Negative for chest pain.  Gastrointestinal:  Negative for abdominal distention, abdominal pain and blood in stool.  Endocrine: Negative for hot flashes.  Genitourinary:  Negative for difficulty urinating and frequency.   Musculoskeletal:  Negative for arthralgias.  Skin:  Negative for itching and rash.  Neurological:  Negative for extremity weakness.  Hematological:  Negative for adenopathy.  Psychiatric/Behavioral:  Negative for confusion.      PHYSICAL EXAMINATION: ECOG PERFORMANCE STATUS: 2 - Symptomatic, <50% confined to bed  Vitals:   04/13/23 1456  BP: 117/76  Pulse: 100  Resp: 18  Temp: (!) 96 F (35.6 C)  SpO2: 95%   Filed Weights   04/13/23 1456  Weight: 120 lb 9.6 oz (54.7 kg)    Physical Exam Constitutional:      General: She is not in acute distress.    Appearance: She is not diaphoretic.     Comments: Thin built elderly female sits in the wheelchair  HENT:     Head: Normocephalic and atraumatic.     Nose: Nose normal.     Mouth/Throat:     Pharynx: No oropharyngeal exudate.  Eyes:      General: No scleral icterus.    Pupils: Pupils are equal, round, and reactive to light.  Cardiovascular:     Rate and Rhythm: Normal rate and regular rhythm.     Heart sounds: No murmur heard. Pulmonary:     Effort: Pulmonary effort is normal. No respiratory distress.     Breath sounds:  Normal breath sounds.  Abdominal:     General: Bowel sounds are normal. There is no distension.     Palpations: Abdomen is soft.     Tenderness: There is no abdominal tenderness.  Musculoskeletal:        General: Normal range of motion.     Cervical back: Normal range of motion and neck supple.  Skin:    General: Skin is warm and dry.     Findings: No erythema.  Neurological:     Mental Status: She is alert and oriented to person, place, and time.     Cranial Nerves: No cranial nerve deficit.     Motor: No abnormal muscle tone.     Coordination: Coordination normal.  Psychiatric:        Mood and Affect: Affect normal.   Right breast palpable mass, about 2 cm  LABORATORY DATA:  I have reviewed the data as listed    Latest Ref Rng & Units 11/26/2018    3:19 AM 11/25/2018    5:30 AM 11/24/2018   11:44 PM  CBC  WBC 4.0 - 10.5 K/uL 8.7  20.2  10.0   Hemoglobin 12.0 - 15.0 g/dL 16.1  09.6  04.5   Hematocrit 36.0 - 46.0 % 37.9  38.1  43.1   Platelets 150 - 400 K/uL 161  169  182       Latest Ref Rng & Units 11/27/2018    3:37 AM 11/26/2018    3:19 AM 11/25/2018    5:30 AM  CMP  Glucose 70 - 99 mg/dL 409  811  914   BUN 8 - 23 mg/dL 6  8  11    Creatinine 0.44 - 1.00 mg/dL 7.82  9.56  2.13   Sodium 135 - 145 mmol/L 135  135  133   Potassium 3.5 - 5.1 mmol/L 4.0  3.3  3.7   Chloride 98 - 111 mmol/L 102  106  103   CO2 22 - 32 mmol/L 24  23  22    Calcium 8.9 - 10.3 mg/dL 8.4  7.8  7.9      RADIOGRAPHIC STUDIES: I have personally reviewed the radiological images as listed and agreed with the findings in the report. CT CHEST W CONTRAST  Result Date: 04/09/2023 CLINICAL DATA:   Abnormal chest x-ray; * Tracking Code: BO * EXAM: CT CHEST WITH CONTRAST TECHNIQUE: Multidetector CT imaging of the chest was performed during intravenous contrast administration. RADIATION DOSE REDUCTION: This exam was performed according to the departmental dose-optimization program which includes automated exposure control, adjustment of the mA and/or kV according to patient size and/or use of iterative reconstruction technique. CONTRAST:  75mL OMNIPAQUE IOHEXOL 300 MG/ML  SOLN COMPARISON:  Chest x-ray dated March 24, 2023; CT abdomen and pelvis dated January 27, 2016 FINDINGS: Cardiovascular: Heart size. Trace pericardial effusion. Normal caliber thoracic aorta with moderate atherosclerotic disease. Standard three-vessel aortic arch with no significant stenosis. No suspicious filling defects of the central pulmonary arteries. Mediastinum/Nodes: Esophagus thyroid are unremarkable. Prominent subcentimeter right hilar, mediastinal and right supraclavicular lymph nodes. Reference subclavicular lymph node measuring 10 mm in short axis on series 2, image 58. Reference pre-vascular lymph node measuring 7 mm on series 2, image 59. Reference right hilar lymph node measuring 7 mm on series 2, image 61 and right supraclavicular lymph node measuring 8 mm on image 17. Lungs/Pleura: Central airways are patent. Irregular subpleural mass of the right upper lobe measuring 6.0 x 4.2 cm with extension into the  right chest wall and mediastinal fat. No pleural effusion. Upper Abdomen: Prior cholecystectomy. Asymmetric thickening of the left adrenal gland, unchanged when compared with 2017 prior. Musculoskeletal: Asymmetric enhancing soft tissue of the right breast measuring 1.7 x 1.3 cm on series 2, image 68. Levocurvature of the spine at the thoracolumbar junction. Subtle increased lucency of the L2 vertebral body, new when compared with the prior exam. IMPRESSION: 1. Irregular mass of the right upper lobe with extension into the  anterior right chest wall and mediastinal fat, favor primary lung malignancy, although breast malignancy is also a consideration given associated finding of asymmetric irregular soft tissue of the right breast. 2. Enhancing asymmetric irregular soft tissue of the right breast, possibly primary breast malignancy or metastatic disease. Could be further evaluated with mammography and breast ultrasound. 3. Prominent subcentimeter right hilar, mediastinal, and right supraclavicular lymph nodes, concerning for metastatic disease. 4. Subtle increased lucency of the L2 vertebral body, concerning for osseous metastatic disease. 5. Trace pericardial effusion. 6. Aortic Atherosclerosis (ICD10-I70.0). These results will be called to the ordering clinician or representative by the Radiologist Assistant, and communication documented in the PACS or [employer]. Electronically Signed   By: Allegra Lai M.D.   On: 04/09/2023 10:58

## 2023-04-13 NOTE — Assessment & Plan Note (Signed)
Pending PET scan evaluation. We just about the possibility of additional mammogram, breast mass biopsy.  Patient is undecided.

## 2023-04-13 NOTE — Assessment & Plan Note (Signed)
Imaging was reviewed and discussed with the patient and her husband. Right upper lobe large mass with hilar, mediastinal and right supraclavicular lymph nodes. Differential diagnosis includes recurrent breast cancer to lung versus primary lung cancer I recommend PET scan for further evaluation.  I discussed about the necessity of biopsy to establish tissue diagnosis. Patient is undecided.  She is leaning towards not to have aggressive treatments if cancer is diagnosed. After lengthy discussion explaining, patient and husband are interested in getting a PET scan done for further evaluation.  Will have another discussion after the PET scan for biopsy.

## 2023-04-13 NOTE — Addendum Note (Signed)
Addended by: Rickard Patience on: 04/13/2023 07:37 PM   Modules accepted: Orders

## 2023-04-19 ENCOUNTER — Ambulatory Visit
Admission: RE | Admit: 2023-04-19 | Discharge: 2023-04-19 | Disposition: A | Discharge status: Home / Self Care | Payer: Medicare Other | Source: Ambulatory Visit | Attending: Oncology | Admitting: Oncology

## 2023-04-19 DIAGNOSIS — R599 Enlarged lymph nodes, unspecified: Secondary | ICD-10-CM | POA: Diagnosis not present

## 2023-04-19 DIAGNOSIS — R918 Other nonspecific abnormal finding of lung field: Secondary | ICD-10-CM | POA: Insufficient documentation

## 2023-04-19 DIAGNOSIS — Z923 Personal history of irradiation: Secondary | ICD-10-CM | POA: Diagnosis not present

## 2023-04-19 DIAGNOSIS — J439 Emphysema, unspecified: Secondary | ICD-10-CM | POA: Insufficient documentation

## 2023-04-19 DIAGNOSIS — I700 Atherosclerosis of aorta: Secondary | ICD-10-CM | POA: Diagnosis not present

## 2023-04-19 LAB — GLUCOSE, CAPILLARY: Glucose-Capillary: 127 mg/dL — ABNORMAL HIGH (ref 70–99)

## 2023-04-19 MED ORDER — FLUDEOXYGLUCOSE F - 18 (FDG) INJECTION
6.2000 | Freq: Once | INTRAVENOUS | Status: AC | PRN
  Administered 2023-04-19: 6.42 via INTRAVENOUS

## 2023-04-22 ENCOUNTER — Inpatient Hospital Stay: Payer: Medicare Other | Attending: Oncology | Admitting: Oncology

## 2023-04-22 ENCOUNTER — Encounter: Payer: Self-pay | Admitting: Oncology

## 2023-04-22 ENCOUNTER — Inpatient Hospital Stay (HOSPITAL_BASED_OUTPATIENT_CLINIC_OR_DEPARTMENT_OTHER): Payer: Medicare Other | Admitting: Hospice and Palliative Medicine

## 2023-04-22 VITALS — BP 103/65 | HR 97 | Temp 97.4°F | Resp 18 | Wt 119.9 lb

## 2023-04-22 DIAGNOSIS — Z79899 Other long term (current) drug therapy: Secondary | ICD-10-CM | POA: Insufficient documentation

## 2023-04-22 DIAGNOSIS — F17210 Nicotine dependence, cigarettes, uncomplicated: Secondary | ICD-10-CM | POA: Diagnosis not present

## 2023-04-22 DIAGNOSIS — Z923 Personal history of irradiation: Secondary | ICD-10-CM | POA: Diagnosis not present

## 2023-04-22 DIAGNOSIS — Z7984 Long term (current) use of oral hypoglycemic drugs: Secondary | ICD-10-CM | POA: Insufficient documentation

## 2023-04-22 DIAGNOSIS — Z7982 Long term (current) use of aspirin: Secondary | ICD-10-CM | POA: Diagnosis not present

## 2023-04-22 DIAGNOSIS — R918 Other nonspecific abnormal finding of lung field: Secondary | ICD-10-CM

## 2023-04-22 DIAGNOSIS — N6315 Unspecified lump in the right breast, overlapping quadrants: Secondary | ICD-10-CM | POA: Diagnosis not present

## 2023-04-22 DIAGNOSIS — Z853 Personal history of malignant neoplasm of breast: Secondary | ICD-10-CM | POA: Insufficient documentation

## 2023-04-22 MED ORDER — ONDANSETRON HCL 8 MG PO TABS: 8.0000 mg | ORAL_TABLET | Freq: Three times a day (TID) | ORAL | 0 refills | Status: DC | PRN

## 2023-04-22 MED ORDER — OLANZAPINE 5 MG PO TABS: 5.0000 mg | ORAL_TABLET | Freq: Every evening | ORAL | 0 refills | Status: DC | PRN

## 2023-04-22 NOTE — Assessment & Plan Note (Addendum)
Imaging was reviewed and discussed with the patient and her husband. Right upper lobe large mass with hilar, mediastinal and right supraclavicular lymph nodes. Differential diagnosis includes recurrent breast cancer to lung versus primary lung cancer PET scan images and results were reviewed with patient and husband.  We discussed about recommendation of lung mass biopsy via bronchoscopy to establish tissue diagnosis.  Patient declines biopsy.  She likely has Stage II or III primary lung cancer with possible subcarinal nodal involvement.  I recommend her to have a discussion with Radonc for feasibility of empiric radiation for the presumed lung cancer.  She will also establish care with palliative care provider to discuss comfort care/hospice if she elects to do no further treatments.

## 2023-04-22 NOTE — Progress Notes (Signed)
Palliative Medicine [facility] at [facility] [phone] [phone]   Name: [name] Date: 04/22/2023 MRN: [id number]  DOB: 1935/08/09  Patient Care Team: Gracelyn Nurse, MD as PCP - General (Internal Medicine)    REASON FOR CONSULTATION: DRU VERDOORN is a 88 y.o. female with multiple medical problems including history of breast cancer status postlumpectomy followed by radiation the patient refused chemotherapy or other adjuvant treatment.  She is now found to have a PET positive lung mass suspicious for primary bronchogenic cancer.  Palliative care was consulted to address goals.  SOCIAL HISTORY:     reports that she has been smoking cigarettes. She has a 50.00 pack-year smoking history. She has never used smokeless tobacco. She reports that she does not drink alcohol and does not use drugs.  Patient is married lives at home with her husband of 58 years.  They had a son who is now deceased.  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  ADVANCE DIRECTIVES:  Not on file  CODE STATUS: DNR (DNR signed 04/22/2023)  PAST MEDICAL HISTORY: Past Medical History:  Diagnosis Date   Breast cancer (HCC) 2016   Right- radiation   COPD (chronic obstructive pulmonary disease) (HCC)    Dermatitis 11/15/2019   Interface dermatitis, Bx proven.   Diabetes mellitus without complication (HCC)    Hyperlipidemia    Hypothyroidism    Osteoporosis    Psoriasis    Substance abuse (HCC)    tobacco   Thyroid disease    Vertigo    x1, several yrs ago   Wears dentures    partial upper    PAST SURGICAL HISTORY:  Past Surgical History:  Procedure Laterality Date   APPENDECTOMY     BREAST BIOPSY Right 2002   neg   BREAST EXCISIONAL BIOPSY Right 02/05/2015   lumpectomy + radation   CATARACT EXTRACTION W/PHACO Left 06/28/2017   Procedure: CATARACT EXTRACTION PHACO AND INTRAOCULAR LENS  PLACEMENT (IOC)  Left Diabetic;  Surgeon: Nevada Crane, MD;  Location: [facility];  Service: Ophthalmology;  Laterality: Left;  Diabetic - oral meds   CATARACT EXTRACTION W/PHACO Right 08/02/2017   Procedure: CATARACT EXTRACTION PHACO AND INTRAOCULAR LENS PLACEMENT (IOC)RIGHT DIABETIC;  Surgeon: Nevada Crane, MD;  Location: [facility];  Service: Ophthalmology;  Laterality: Right;  DIABETIC WANTS TO BE AFTER 8   CESAREAN SECTION     CHOLECYSTECTOMY     COLONOSCOPY WITH PROPOFOL N/A 03/05/2016   Procedure: COLONOSCOPY WITH PROPOFOL;  Surgeon: Scot Jun, MD;  Location: [facility];  Service: Endoscopy;  Laterality: N/A;   ESOPHAGOGASTRODUODENOSCOPY (EGD) WITH PROPOFOL N/A 03/05/2016   Procedure: ESOPHAGOGASTRODUODENOSCOPY (EGD) WITH PROPOFOL;  Surgeon: Scot Jun, MD;  Location: [facility] END[facility];  Service: Endoscopy;  Laterality: N/A;   TONSILLECTOMY      HEMATOLOGY/ONCOLOGY HISTORY:  Oncology History   No history exists.    ALLERGIES:  is allergic to statins, atorvastatin, codeine, septra [sulfamethoxazole-trimethoprim], and sulfa antibiotics.  MEDICATIONS:  Current Outpatient Medications  Medication Sig Dispense Refill   acetaminophen (TYLENOL) 650 MG CR tablet Take by mouth.     Apremilast (OTEZLA) 30 MG TABS Take 1 tablet (30 mg total) by mouth 2 (two) times daily. 60 tablet 5   Ascorbic Acid (VITAMIN C PO) Take 500 mg by mouth daily. (Patient not taking: Reported on 04/03/2021)     aspirin 81 MG tablet Take  81 mg by mouth daily.     clobetasol (TEMOVATE) 0.05 % GEL Apply topically up to twice daily as needed. 60 g 1   clobetasol cream (TEMOVATE) 0.05 % APPLY TO AFFECTED AREA (RASH) DAILY UNTIL CLEAR. AVOID FACE, GROIN, AXILLA 60 g 0   diphenhydramine-acetaminophen (TYLENOL PM) 25-500 MG TABS tablet Take 1 tablet by mouth at bedtime as needed (sleep).      ferrous sulfate 325 (65 FE) MG tablet Take by mouth.     glipiZIDE (GLUCOTROL XL) 10 MG 24  hr tablet Take 10 mg by mouth 2 (two) times daily.     levothyroxine (SYNTHROID, LEVOTHROID) 137 MCG tablet Take 137 mcg by mouth daily before breakfast.     LORazepam (ATIVAN) 0.5 MG tablet Take 0.5 mg by mouth every 8 (eight) hours.     metFORMIN (GLUCOPHAGE) 1000 MG tablet Take 1,000 mg by mouth 2 (two) times daily with a meal.     mupirocin ointment (BACTROBAN) 2 % Apply to open sores  qd-bid 22 g 0   omeprazole (PRILOSEC) 20 MG capsule Take 20 mg by mouth daily.     Ruxolitinib Phosphate (OPZELURA) 1.5 % CREA Apply 1 application topically daily. Apply 1-2 times daily as needed for itch/rash. (Patient not taking: Reported on 04/13/2023) 60 g 0   simvastatin (ZOCOR) 40 MG tablet Take 40 mg by mouth daily.     No current facility-administered medications for this visit.    VITAL SIGNS: There were no vitals taken for this visit. There were no vitals filed for this visit.  Estimated body mass index is 20.58 kg/m as calculated from the following:   Height as of 11/25/18: 5\' 4"  (1.626 m).   Weight as of an earlier encounter on 04/22/23: 119 lb 14.4 oz (54.4 kg).  LABS: CBC:    Component Value Date/Time   WBC 8.7 11/26/2018 0319   HGB 12.4 11/26/2018 0319   HGB 7.3 (L) 04/11/2015 0321   HCT 37.9 11/26/2018 0319   HCT 25.6 (L) 04/11/2015 0321   PLT 161 11/26/2018 0319   PLT 252 04/11/2015 0321   MCV 90.7 11/26/2018 0319   MCV 61 (L) 04/11/2015 0321   NEUTROABS 9.3 (H) 11/24/2018 2344   NEUTROABS 3.8 04/11/2015 0321   LYMPHSABS 0.4 (L) 11/24/2018 2344   LYMPHSABS 1.8 04/11/2015 0321   MONOABS 0.3 11/24/2018 2344   MONOABS 0.6 04/11/2015 0321   EOSABS 0.0 11/24/2018 2344   EOSABS 0.4 04/11/2015 0321   BASOSABS 0.1 11/24/2018 2344   BASOSABS 0.2 (H) 04/11/2015 0321   Comprehensive Metabolic Panel:    Component Value Date/Time   NA 135 11/27/2018 0337   K 4.0 11/27/2018 0337   CL 102 11/27/2018 0337   CO2 24 11/27/2018 0337   BUN 6 (L) 11/27/2018 0337   CREATININE 0.63  11/27/2018 0337   GLUCOSE 130 (H) 11/27/2018 0337   CALCIUM 8.4 (L) 11/27/2018 0337   AST 29 11/24/2018 2344   ALT 16 11/24/2018 2344   ALKPHOS 88 11/24/2018 2344   BILITOT 0.9 11/24/2018 2344   PROT 6.5 11/24/2018 2344   ALBUMIN 3.4 (L) 11/24/2018 2344    RADIOGRAPHIC STUDIES: NM PET Image Initial (PI) Skull Base To Thigh  Result Date: 04/22/2023 CLINICAL DATA:  Initial treatment strategy for right upper lobe lung mass, adenopathy, and right breast soft tissue fullness on CT. History of right sided lumpectomy and radiation therapy in 2016. EXAM: NUCLEAR MEDICINE PET SKULL BASE TO THIGH TECHNIQUE: 6.4 mCi F-18 FDG was injected  intravenously. Full-ring PET imaging was performed from the skull base to thigh after the radiotracer. CT data was obtained and used for attenuation correction and anatomic localization. Fasting blood glucose: 127 mg/dl COMPARISON:  Chest CT 82/95/6213.  CT stone study 11/25/2018. FINDINGS: Mediastinal blood pool activity: SUV max 1.9 Liver activity: SUV max NA NECK: No areas of abnormal hypermetabolism. Incidental CT findings: No cervical adenopathy. CHEST: The anteromedial right upper lobe lung mass is hypermetabolic, including at 5.6 x 4.2 cm and a S.U.V. max of 14.0 on 53/4. A right-sided node at the thoracic inlet measures 4 mm and a S.U.V. max of 1.9 on 37/4. Node within the subcarinal space with extension into the azygoesophageal recess measures 8 mm and a S.U.V. max of 2.5 on 54/4. Incidental CT findings: Deferred to recent diagnostic CT. Aortic and coronary artery calcification. Lipomatous hypertrophy of the interatrial septum. Mild cardiomegaly. Centrilobular emphysema. ABDOMEN/PELVIS: No abdominopelvic nodal hypermetabolism. Gastric cardia low-level activity at a S.U.V. max of 3.4 with diffuse gastric wall thickening today and on the recent chest CT. Incidental CT findings: Normal adrenal glands. Moderate cirrhosis, as evidenced by caudate and lateral segment left liver  lobe enlargement. Medial segment left liver lobe atrophy. Cholecystectomy. Portosystemic collaterals, including within the left abdominal retroperitoneum on 101/4. Abdominal aortic atherosclerosis. Favor bilateral renovascular calcifications over calculi. Extensive colonic diverticulosis. Pelvic floor laxity. SKELETON: Mild hypermetabolism corresponding to lateral right breast nodularity. Example 1.8 cm and a S.U.V. max of 2.0 on 64/4. No abnormal marrow hypermetabolism, including within the L2 vertebral body. Incidental CT findings: Mild osteopenia. IMPRESSION: 1. Right upper lobe hypermetabolic lung mass, consistent with primary bronchogenic carcinoma. 2. Equivocal nodes within the mediastinum as detailed above. 3. No extrathoracic hypermetabolic metastasis. 4. Mild hypermetabolism corresponding to the lateral right breast nodule. Recommend right-sided diagnostic mammogram and ultrasound. 5. Gastric cardia low-level hypermetabolism with chronic wall thickening including back to 11/25/2018. Correlate with symptoms of gastritis. Neoplasm felt less likely given chronicity. 6. Cirrhosis and portal venous hypertension. 7. Incidental findings, including: Aortic atherosclerosis (ICD10-I70.0), coronary artery atherosclerosis and emphysema (ICD10-J43.9). Pelvic floor laxity. Electronically Signed   By: Jeronimo Greaves M.D.   On: 04/22/2023 09:05   CT CHEST W CONTRAST  Result Date: 04/09/2023 CLINICAL DATA:  Abnormal chest x-ray; * Tracking Code: BO * EXAM: CT CHEST WITH CONTRAST TECHNIQUE: Multidetector CT imaging of the chest was performed during intravenous contrast administration. RADIATION DOSE REDUCTION: This exam was performed according to the departmental dose-optimization program which includes automated exposure control, adjustment of the mA and/or kV according to patient size and/or use of iterative reconstruction technique. CONTRAST:  75mL OMNIPAQUE IOHEXOL 300 MG/ML  SOLN COMPARISON:  Chest x-ray dated March 24, 2023; CT abdomen and pelvis dated January 27, 2016 FINDINGS: Cardiovascular: Heart size. Trace pericardial effusion. Normal caliber thoracic aorta with moderate atherosclerotic disease. Standard three-vessel aortic arch with no significant stenosis. No suspicious filling defects of the central pulmonary arteries. Mediastinum/Nodes: Esophagus thyroid are unremarkable. Prominent subcentimeter right hilar, mediastinal and right supraclavicular lymph nodes. Reference subclavicular lymph node measuring 10 mm in short axis on series 2, image 58. Reference pre-vascular lymph node measuring 7 mm on series 2, image 59. Reference right hilar lymph node measuring 7 mm on series 2, image 61 and right supraclavicular lymph node measuring 8 mm on image 17. Lungs/Pleura: Central airways are patent. Irregular subpleural mass of the right upper lobe measuring 6.0 x 4.2 cm with extension into the right chest wall and mediastinal fat. No pleural effusion. Upper  Abdomen: Prior cholecystectomy. Asymmetric thickening of the left adrenal gland, unchanged when compared with 2017 prior. Musculoskeletal: Asymmetric enhancing soft tissue of the right breast measuring 1.7 x 1.3 cm on series 2, image 68. Levocurvature of the spine at the thoracolumbar junction. Subtle increased lucency of the L2 vertebral body, new when compared with the prior exam. IMPRESSION: 1. Irregular mass of the right upper lobe with extension into the anterior right chest wall and mediastinal fat, favor primary lung malignancy, although breast malignancy is also a consideration given associated finding of asymmetric irregular soft tissue of the right breast. 2. Enhancing asymmetric irregular soft tissue of the right breast, possibly primary breast malignancy or metastatic disease. Could be further evaluated with mammography and breast ultrasound. 3. Prominent subcentimeter right hilar, mediastinal, and right supraclavicular lymph nodes, concerning for metastatic  disease. 4. Subtle increased lucency of the L2 vertebral body, concerning for osseous metastatic disease. 5. Trace pericardial effusion. 6. Aortic Atherosclerosis (ICD10-I70.0). These results will be called to the ordering clinician or representative by the Radiologist Assistant, and communication documented in the PACS or [employer]. Electronically Signed   By: Allegra Lai M.D.   On: 04/09/2023 10:58    PERFORMANCE STATUS (ECOG) : 2 - Symptomatic, <50% confined to bed  Review of Systems Unless otherwise noted, a complete review of systems is negative.  Physical Exam General: NAD Pulmonary: Unlabored Extremities: no edema, no joint deformities Skin: no rashes Neurological: Weakness but otherwise nonfocal  IMPRESSION: With patient and husband after their visit with Dr. Cathie Hoops.  Patient was able to relate to me and understanding that she likely has an advanced stage lung cancer.  She has a history of breast cancer and reports that she opted to forego chemotherapy or AI. She did agree to radiation but did not like the way it made her feel.  At this point, patient says that she is not interested in biopsy or any systemic cancer treatment.  She is willing to speak with Dr. Rushie Chestnut about possible radiation but is not sure that she even wants to pursue that.  We did discuss the option of best supportive care and hospice.  Patient is interested in hospice involvement and will let us know whether she wishes to pursue radiation so that we can either make the referral now or following XRT treatments.  We discussed CODE STATUS.  Patient would not want to be resuscitated or have her life prolonged artificially on machines.  She agreed with DNR/DNI and I sent her home with a DNR order.  Symptomatically, she endorses nausea and feelings of malaise.  Appetite is poor.  Will send prescription for ondansetron.  She has difficulty sleeping and takes lorazepam at bedtime.  Discussed that we could trial  olanzapine at bedtime instead of lorazepam as this might help both insomnia and nausea.  She might also have some improvement in appetite.  Patient verbalized agreement.  PLAN: -Recommend best supportive care -Patient to let us know whether she wishes to pursue XRT versus hospice -Ondansetron -Zyprexa at bedtime -DNR/DNI -Follow-up telephone visit 1 month  Case and plan discussed with Dr. Cathie Hoops  Patient expressed understanding and was in agreement with this plan. She also understands that She can call the clinic at any time with any questions, concerns, or complaints.     Time Total: 25 minutes  Visit consisted of counseling and education dealing with the complex and emotionally intense issues of symptom management and palliative care in the setting of serious and potentially  life-threatening illness.Greater than 50%  of this time was spent counseling and coordinating care related to the above assessment and plan.  Signed by: Altha Harm, PhD, NP-C

## 2023-04-22 NOTE — Progress Notes (Signed)
Hematology/Oncology Consult Note [phone] [phone]     REFERRING PROVIDER: Gracelyn Nurse, MD    CHIEF COMPLAINTS/PURPOSE OF CONSULTATION:  Lung mass  ASSESSMENT & PLAN:   Lung mass Imaging was reviewed and discussed with the patient and her husband. Right upper lobe large mass with hilar, mediastinal and right supraclavicular lymph nodes. Differential diagnosis includes recurrent breast cancer to lung versus primary lung cancer PET scan images and results were reviewed with patient and husband.  We discussed about recommendation of lung mass biopsy via bronchoscopy to establish tissue diagnosis.  Patient declines biopsy.  She likely has Stage II or III primary lung cancer with possible subcarinal nodal involvement.  I recommend her to have a discussion with Radonc for feasibility of empiric radiation for the presumed lung cancer.  She will also establish care with palliative care provider to discuss comfort care/hospice if she elects to do no further treatments.   Breast mass, right Mild hypermetabolism corresponding to the lateral right breast nodule. This could represent an early breast cancer or recurrence.  Patient is not interested in  additional mammogram, breast mass biopsy.   Orders Placed This Encounter  Procedures   Ambulatory referral to Radiation Oncology    Referral Priority:   Routine    Referral Type:   Consultation    Referral Reason:   Specialty Services Required    Requested Specialty:   Radiation Oncology    Number of Visits Requested:   1    Follow up TBD All questions were answered. The patient knows to call the clinic with any problems, questions or concerns.  Rickard Patience, MD, PhD [facility] Hematology Oncology 04/22/2023    HISTORY OF PRESENTING ILLNESS:  [name] 88 y.o. female presents to establish care for lung mass.  Patient recently followed up with primary care provider.  She has experienced hot and  cold spells, chest x-ray showed possible pneumonia.  Patient was treated with a course of pneumonia.  CT scan was obtained for further evaluation. 04/07/2023 CT chest with contrast showed 1. Irregular mass of the right upper lobe with extension into the anterior right chest wall and mediastinal fat, favor primary lung malignancy, although breast malignancy is also a consideration given associated finding of asymmetric irregular soft tissue of the right breast. 2. Enhancing asymmetric irregular soft tissue of the right breast, possibly primary breast malignancy or metastatic disease. Could be further evaluated with mammography and breast ultrasound.  3. Prominent subcentimeter right hilar, mediastinal, and right supraclavicular lymph nodes, concerning for metastatic disease. 4. Subtle increased lucency of the L2 vertebral body, concerning for osseous metastatic disease. 5. Trace pericardial effusion. 6. Aortic Atherosclerosis  Patient presents to establish care.  She was accompanied by her husband. She has had lost weight unintentionally.  Appetite has not been good.  Denies night sweats or fever, cough, shortness of breath. She is a current everyday smoker.  2016, she has a remote right breast cancer status post lumpectomy and radiation. pT1b pN0, ER90% PR 90% HER 1+.  Patient declined adjuvant endocrine therapy Her last mammogram was in November 2018.  She has elected to stop mammogram due to age.  She has noticed a right breast mass for about a year,   Patient has chronic hearing loss.  Part of the history was obtained from husband.     INTERVAL HISTORY [name] is a 88 y.o. female who has above history reviewed by me today presents for follow up visit for  lung mass and breast mass.  She has PET scan done, presents to discuss results. Accompanied by husband.   MEDICAL HISTORY:  Past Medical History:  Diagnosis Date   Breast cancer (HCC) 2016   Right- radiation   COPD (chronic  obstructive pulmonary disease) (HCC)    Dermatitis 11/15/2019   Interface dermatitis, Bx proven.   Diabetes mellitus without complication (HCC)    Hyperlipidemia    Hypothyroidism    Osteoporosis    Psoriasis    Substance abuse (HCC)    tobacco   Thyroid disease    Vertigo    x1, several yrs ago   Wears dentures    partial upper    SURGICAL HISTORY: Past Surgical History:  Procedure Laterality Date   APPENDECTOMY     BREAST BIOPSY Right 2002   neg   BREAST EXCISIONAL BIOPSY Right 02/05/2015   lumpectomy + radation   CATARACT EXTRACTION W/PHACO Left 06/28/2017   Procedure: CATARACT EXTRACTION PHACO AND INTRAOCULAR LENS PLACEMENT (IOC)  Left Diabetic;  Surgeon: Nevada Crane, MD;  Location: [facility];  Service: Ophthalmology;  Laterality: Left;  Diabetic - oral meds   CATARACT EXTRACTION W/PHACO Right 08/02/2017   Procedure: CATARACT EXTRACTION PHACO AND INTRAOCULAR LENS PLACEMENT (IOC)RIGHT DIABETIC;  Surgeon: Nevada Crane, MD;  Location: [facility];  Service: Ophthalmology;  Laterality: Right;  DIABETIC WANTS TO BE AFTER 8   CESAREAN SECTION     CHOLECYSTECTOMY     COLONOSCOPY WITH PROPOFOL N/A 03/05/2016   Procedure: COLONOSCOPY WITH PROPOFOL;  Surgeon: Scot Jun, MD;  Location: [facility];  Service: Endoscopy;  Laterality: N/A;   ESOPHAGOGASTRODUODENOSCOPY (EGD) WITH PROPOFOL N/A 03/05/2016   Procedure: ESOPHAGOGASTRODUODENOSCOPY (EGD) WITH PROPOFOL;  Surgeon: Scot Jun, MD;  Location: [facility] END[facility];  Service: Endoscopy;  Laterality: N/A;   TONSILLECTOMY      SOCIAL HISTORY: Social History   Socioeconomic History   Marital status: Married    Spouse name: Not on file   Number of children: Not on file   Years of education: Not on file   Highest education level: Not on file  Occupational History   Not on file  Tobacco Use   Smoking status: Every Day    Packs/day: 1.00    Years: 50.00    Additional pack years: 0.00     Total pack years: 50.00    Types: Cigarettes   Smokeless tobacco: Never  Substance and Sexual Activity   Alcohol use: No   Drug use: No   Sexual activity: Not on file  Other Topics Concern   Not on file  Social History Narrative   Not on file   Social Determinants of Health   Financial Resource Strain: Low Risk  (04/13/2023)   Overall Financial Resource Strain (CARDIA)    Difficulty of Paying Living Expenses: Not very hard  Food Insecurity: No Food Insecurity (04/13/2023)   Hunger Vital Sign    Worried About Running Out of Food in the Last Year: Never true    Ran Out of Food in the Last Year: Never true  Transportation Needs: No Transportation Needs (04/13/2023)   PRAPARE - Administrator, Civil Service (Medical): No    Lack of Transportation (Non-Medical): No  Physical Activity: Not on file  Stress: No Stress Concern Present (04/13/2023)   [employer] of Occupational Health - Occupational Stress Questionnaire    Feeling of Stress : Only a little  Social Connections: Not on file  Intimate Partner  Violence: Not At Risk (04/13/2023)   Humiliation, Afraid, Rape, and Kick questionnaire    Fear of Current or Ex-Partner: No    Emotionally Abused: No    Physically Abused: No    Sexually Abused: No    FAMILY HISTORY: Family History  Problem Relation Age of Onset   Other Mother    Tuberculosis Father    Diabetes Paternal Grandmother     ALLERGIES:  is allergic to statins, atorvastatin, codeine, septra [sulfamethoxazole-trimethoprim], and sulfa antibiotics.  MEDICATIONS:  Current Outpatient Medications  Medication Sig Dispense Refill   acetaminophen (TYLENOL) 650 MG CR tablet Take by mouth.     Apremilast (OTEZLA) 30 MG TABS Take 1 tablet (30 mg total) by mouth 2 (two) times daily. 60 tablet 5   aspirin 81 MG tablet Take 81 mg by mouth daily.     clobetasol (TEMOVATE) 0.05 % GEL Apply topically up to twice daily as needed. 60 g 1   clobetasol cream (TEMOVATE)  0.05 % APPLY TO AFFECTED AREA (RASH) DAILY UNTIL CLEAR. AVOID FACE, GROIN, AXILLA 60 g 0   diphenhydramine-acetaminophen (TYLENOL PM) 25-500 MG TABS tablet Take 1 tablet by mouth at bedtime as needed (sleep).      ferrous sulfate 325 (65 FE) MG tablet Take by mouth.     glipiZIDE (GLUCOTROL XL) 10 MG 24 hr tablet Take 10 mg by mouth 2 (two) times daily.     levothyroxine (SYNTHROID, LEVOTHROID) 137 MCG tablet Take 137 mcg by mouth daily before breakfast.     LORazepam (ATIVAN) 0.5 MG tablet Take 0.5 mg by mouth every 8 (eight) hours.     metFORMIN (GLUCOPHAGE) 1000 MG tablet Take 1,000 mg by mouth 2 (two) times daily with a meal.     mupirocin ointment (BACTROBAN) 2 % Apply to open sores  qd-bid 22 g 0   omeprazole (PRILOSEC) 20 MG capsule Take 20 mg by mouth daily.     simvastatin (ZOCOR) 40 MG tablet Take 40 mg by mouth daily.     Ascorbic Acid (VITAMIN C PO) Take 500 mg by mouth daily. (Patient not taking: Reported on 04/03/2021)     OLANZapine (ZYPREXA) 5 MG tablet Take 1 tablet (5 mg total) by mouth at bedtime as needed (sleep or nausea). 30 tablet 0   ondansetron (ZOFRAN) 8 MG tablet Take 1 tablet (8 mg total) by mouth every 8 (eight) hours as needed for nausea or vomiting. 20 tablet 0   Ruxolitinib Phosphate (OPZELURA) 1.5 % CREA Apply 1 application topically daily. Apply 1-2 times daily as needed for itch/rash. (Patient not taking: Reported on 04/13/2023) 60 g 0   No current facility-administered medications for this visit.    Review of Systems  Constitutional:  Positive for appetite change, fatigue and unexpected weight change. Negative for chills and fever.  HENT:   Positive for hearing loss. Negative for voice change.   Eyes:  Negative for eye problems.  Respiratory:  Negative for chest tightness and cough.   Cardiovascular:  Negative for chest pain.  Gastrointestinal:  Negative for abdominal distention, abdominal pain and blood in stool.  Endocrine: Negative for hot flashes.   Genitourinary:  Negative for difficulty urinating and frequency.   Musculoskeletal:  Negative for arthralgias.  Skin:  Negative for itching and rash.  Neurological:  Negative for extremity weakness.  Hematological:  Negative for adenopathy.  Psychiatric/Behavioral:  Negative for confusion.      PHYSICAL EXAMINATION: ECOG PERFORMANCE STATUS: 2 - Symptomatic, <50% confined to bed  Vitals:   04/22/23 1455  BP: 103/65  Pulse: 97  Resp: 18  Temp: (!) 97.4 F (36.3 C)  SpO2: 95%   Filed Weights   04/22/23 1455  Weight: 119 lb 14.4 oz (54.4 kg)    Physical Exam Constitutional:      General: She is not in acute distress.    Appearance: She is not diaphoretic.     Comments: Thin built elderly female sits in the wheelchair  HENT:     Head: Normocephalic and atraumatic.     Mouth/Throat:     Pharynx: No oropharyngeal exudate.  Eyes:     General: No scleral icterus. Cardiovascular:     Rate and Rhythm: Normal rate.  Pulmonary:     Effort: Pulmonary effort is normal. No respiratory distress.  Abdominal:     General: There is no distension.  Musculoskeletal:        General: Normal range of motion.     Cervical back: Normal range of motion and neck supple.  Skin:    General: Skin is warm and dry.     Findings: No erythema.  Neurological:     Mental Status: She is alert. Mental status is at baseline.     Cranial Nerves: No cranial nerve deficit.     Motor: No abnormal muscle tone.  Psychiatric:        Mood and Affect: Affect normal.   Right breast palpable mass, about 2 cm  LABORATORY DATA:  I have reviewed the data as listed    Latest Ref Rng & Units 11/26/2018    3:19 AM 11/25/2018    5:30 AM 11/24/2018   11:44 PM  CBC  WBC 4.0 - 10.5 K/uL 8.7  20.2  10.0   Hemoglobin 12.0 - 15.0 g/dL 16.1  09.6  04.5   Hematocrit 36.0 - 46.0 % 37.9  38.1  43.1   Platelets 150 - 400 K/uL 161  169  182       Latest Ref Rng & Units 11/27/2018    3:37 AM 11/26/2018    3:19  AM 11/25/2018    5:30 AM  CMP  Glucose 70 - 99 mg/dL 409  811  914   BUN 8 - 23 mg/dL 6  8  11    Creatinine 0.44 - 1.00 mg/dL 7.82  9.56  2.13   Sodium 135 - 145 mmol/L 135  135  133   Potassium 3.5 - 5.1 mmol/L 4.0  3.3  3.7   Chloride 98 - 111 mmol/L 102  106  103   CO2 22 - 32 mmol/L 24  23  22    Calcium 8.9 - 10.3 mg/dL 8.4  7.8  7.9      RADIOGRAPHIC STUDIES: I have personally reviewed the radiological images as listed and agreed with the findings in the report. NM PET Image Initial (PI) Skull Base To Thigh  Result Date: 04/22/2023 CLINICAL DATA:  Initial treatment strategy for right upper lobe lung mass, adenopathy, and right breast soft tissue fullness on CT. History of right sided lumpectomy and radiation therapy in 2016. EXAM: NUCLEAR MEDICINE PET SKULL BASE TO THIGH TECHNIQUE: 6.4 mCi F-18 FDG was injected intravenously. Full-ring PET imaging was performed from the skull base to thigh after the radiotracer. CT data was obtained and used for attenuation correction and anatomic localization. Fasting blood glucose: 127 mg/dl COMPARISON:  Chest CT 08/65/7846.  CT stone study 11/25/2018. FINDINGS: Mediastinal blood pool activity: SUV max 1.9 Liver activity: SUV max NA  NECK: No areas of abnormal hypermetabolism. Incidental CT findings: No cervical adenopathy. CHEST: The anteromedial right upper lobe lung mass is hypermetabolic, including at 5.6 x 4.2 cm and a S.U.V. max of 14.0 on 53/4. A right-sided node at the thoracic inlet measures 4 mm and a S.U.V. max of 1.9 on 37/4. Node within the subcarinal space with extension into the azygoesophageal recess measures 8 mm and a S.U.V. max of 2.5 on 54/4. Incidental CT findings: Deferred to recent diagnostic CT. Aortic and coronary artery calcification. Lipomatous hypertrophy of the interatrial septum. Mild cardiomegaly. Centrilobular emphysema. ABDOMEN/PELVIS: No abdominopelvic nodal hypermetabolism. Gastric cardia low-level activity at a S.U.V. max of  3.4 with diffuse gastric wall thickening today and on the recent chest CT. Incidental CT findings: Normal adrenal glands. Moderate cirrhosis, as evidenced by caudate and lateral segment left liver lobe enlargement. Medial segment left liver lobe atrophy. Cholecystectomy. Portosystemic collaterals, including within the left abdominal retroperitoneum on 101/4. Abdominal aortic atherosclerosis. Favor bilateral renovascular calcifications over calculi. Extensive colonic diverticulosis. Pelvic floor laxity. SKELETON: Mild hypermetabolism corresponding to lateral right breast nodularity. Example 1.8 cm and a S.U.V. max of 2.0 on 64/4. No abnormal marrow hypermetabolism, including within the L2 vertebral body. Incidental CT findings: Mild osteopenia. IMPRESSION: 1. Right upper lobe hypermetabolic lung mass, consistent with primary bronchogenic carcinoma. 2. Equivocal nodes within the mediastinum as detailed above. 3. No extrathoracic hypermetabolic metastasis. 4. Mild hypermetabolism corresponding to the lateral right breast nodule. Recommend right-sided diagnostic mammogram and ultrasound. 5. Gastric cardia low-level hypermetabolism with chronic wall thickening including back to 11/25/2018. Correlate with symptoms of gastritis. Neoplasm felt less likely given chronicity. 6. Cirrhosis and portal venous hypertension. 7. Incidental findings, including: Aortic atherosclerosis (ICD10-I70.0), coronary artery atherosclerosis and emphysema (ICD10-J43.9). Pelvic floor laxity. Electronically Signed   By: Jeronimo Greaves M.D.   On: 04/22/2023 09:05   CT CHEST W CONTRAST  Result Date: 04/09/2023 CLINICAL DATA:  Abnormal chest x-ray; * Tracking Code: BO * EXAM: CT CHEST WITH CONTRAST TECHNIQUE: Multidetector CT imaging of the chest was performed during intravenous contrast administration. RADIATION DOSE REDUCTION: This exam was performed according to the departmental dose-optimization program which includes automated exposure  control, adjustment of the mA and/or kV according to patient size and/or use of iterative reconstruction technique. CONTRAST:  75mL OMNIPAQUE IOHEXOL 300 MG/ML  SOLN COMPARISON:  Chest x-ray dated March 24, 2023; CT abdomen and pelvis dated January 27, 2016 FINDINGS: Cardiovascular: Heart size. Trace pericardial effusion. Normal caliber thoracic aorta with moderate atherosclerotic disease. Standard three-vessel aortic arch with no significant stenosis. No suspicious filling defects of the central pulmonary arteries. Mediastinum/Nodes: Esophagus thyroid are unremarkable. Prominent subcentimeter right hilar, mediastinal and right supraclavicular lymph nodes. Reference subclavicular lymph node measuring 10 mm in short axis on series 2, image 58. Reference pre-vascular lymph node measuring 7 mm on series 2, image 59. Reference right hilar lymph node measuring 7 mm on series 2, image 61 and right supraclavicular lymph node measuring 8 mm on image 17. Lungs/Pleura: Central airways are patent. Irregular subpleural mass of the right upper lobe measuring 6.0 x 4.2 cm with extension into the right chest wall and mediastinal fat. No pleural effusion. Upper Abdomen: Prior cholecystectomy. Asymmetric thickening of the left adrenal gland, unchanged when compared with 2017 prior. Musculoskeletal: Asymmetric enhancing soft tissue of the right breast measuring 1.7 x 1.3 cm on series 2, image 68. Levocurvature of the spine at the thoracolumbar junction. Subtle increased lucency of the L2 vertebral body, new when compared with  the prior exam. IMPRESSION: 1. Irregular mass of the right upper lobe with extension into the anterior right chest wall and mediastinal fat, favor primary lung malignancy, although breast malignancy is also a consideration given associated finding of asymmetric irregular soft tissue of the right breast. 2. Enhancing asymmetric irregular soft tissue of the right breast, possibly primary breast malignancy or  metastatic disease. Could be further evaluated with mammography and breast ultrasound. 3. Prominent subcentimeter right hilar, mediastinal, and right supraclavicular lymph nodes, concerning for metastatic disease. 4. Subtle increased lucency of the L2 vertebral body, concerning for osseous metastatic disease. 5. Trace pericardial effusion. 6. Aortic Atherosclerosis (ICD10-I70.0). These results will be called to the ordering clinician or representative by the Radiologist Assistant, and communication documented in the PACS or [employer]. Electronically Signed   By: Allegra Lai M.D.   On: 04/09/2023 10:58

## 2023-04-22 NOTE — Assessment & Plan Note (Addendum)
Mild hypermetabolism corresponding to the lateral right breast nodule. This could represent an early breast cancer or recurrence.  Patient is not interested in  additional mammogram, breast mass biopsy.

## 2023-04-24 ENCOUNTER — Encounter: Payer: Self-pay | Admitting: Hospice and Palliative Medicine

## 2023-04-26 ENCOUNTER — Other Ambulatory Visit: Payer: Self-pay | Admitting: *Deleted

## 2023-04-26 DIAGNOSIS — R918 Other nonspecific abnormal finding of lung field: Secondary | ICD-10-CM

## 2023-04-28 ENCOUNTER — Inpatient Hospital Stay: Payer: Medicare Other

## 2023-04-29 ENCOUNTER — Institutional Professional Consult (permissible substitution): Payer: Medicare Other | Admitting: Radiation Oncology

## 2023-05-18 ENCOUNTER — Other Ambulatory Visit: Payer: Self-pay | Admitting: *Deleted

## 2023-05-18 MED ORDER — OLANZAPINE 5 MG PO TABS: 5.0000 mg | ORAL_TABLET | Freq: Every evening | ORAL | 1 refills | Status: DC | PRN

## 2023-05-18 MED ORDER — ONDANSETRON HCL 8 MG PO TABS: 8.0000 mg | ORAL_TABLET | Freq: Three times a day (TID) | ORAL | 1 refills | Status: DC | PRN

## 2023-05-24 ENCOUNTER — Telehealth: Payer: Self-pay | Admitting: Hospice and Palliative Medicine

## 2023-05-24 ENCOUNTER — Inpatient Hospital Stay: Payer: Medicare Other | Attending: Oncology | Admitting: Hospice and Palliative Medicine

## 2023-05-24 DIAGNOSIS — R918 Other nonspecific abnormal finding of lung field: Secondary | ICD-10-CM

## 2023-05-24 MED ORDER — VENLAFAXINE HCL ER 37.5 MG PO CP24: 37.5000 mg | ORAL_CAPSULE | Freq: Every day | ORAL | 2 refills | Status: DC

## 2023-05-24 MED ORDER — NYSTATIN 100000 UNIT/ML MT SUSP: 5.0000 mL | Freq: Four times a day (QID) | OROMUCOSAL | 1 refills | Status: DC

## 2023-05-24 NOTE — Telephone Encounter (Signed)
Pt spouse called to make sure that the visit scheduled for today at 2pm was a phone call and not a video visit. I told him that is was listed as a telephone call on the schedule. Pt spouse stated the home phone number listed is correct. Pt spouse stated that a phone call is great because they know how to work the phone and had trouble with technology/video visits.

## 2023-05-24 NOTE — Progress Notes (Signed)
Virtual Visit via Telephone Note  I connected with [name] on 05/24/23 at  2:00 PM EDT by telephone and verified that I am speaking with the correct person using two identifiers.  Location: Patient: Home Provider: Clinic   I discussed the limitations, risks, security and privacy concerns of performing an evaluation and management service by telephone and the availability of in person appointments. I also discussed with the patient that there may be a patient responsible charge related to this service. The patient expressed understanding and agreed to proceed.   History of Present Illness: [name] is a 88 y.o. female with multiple medical problems including history of breast cancer status postlumpectomy followed by radiation the patient refused chemotherapy or other adjuvant treatment.  She is now found to have a PET positive lung mass suspicious for primary bronchogenic cancer.  Palliative care was consulted to address goals.    Observations/Objective: I called and spoke with patient and husband.  Patient decided not to pursue any further workup or cancer treatment.  She was referred to hospice.  Patient says that she is doing okay.  She is having several symptoms that are impacting her quality of life.  She endorses recurrent thrush and request refill of oral nystatin.  She says that she is also having persistent night sweats but denies fever or chills.  Likely, this is attributed to her cancer.  Will trial SNRI.  Patient also endorses hypoglycemia with CBG running 200s to 300s.  She is on glipizide and was previously on metformin but this was stopped for an unknown reason.  I suggested that she follow-up with her PCP.  Also called and spoke with her hospice nurse - Vernona Rieger Rosinski  Assessment and Plan: Lung mass-on hospice  Night sweats -likely secondary to cancer.  Start venlafaxine 37.5 mg daily  Thrush -oral nystatin  Hyperglycemia -recommend follow-up with PCP.   Consider restarting metformin  Follow Up Instructions: As needed   I discussed the assessment and treatment plan with the patient. The patient was provided an opportunity to ask questions and all were answered. The patient agreed with the plan and demonstrated an understanding of the instructions.   The patient was advised to call back or seek an in-person evaluation if the symptoms worsen or if the condition fails to improve as anticipated.  I provided 15 minutes of non-face-to-face time during this encounter.   Malachy Moan, NP

## 2023-06-14 ENCOUNTER — Other Ambulatory Visit: Payer: Self-pay | Admitting: *Deleted

## 2023-06-14 MED ORDER — OLANZAPINE 5 MG PO TABS: 5.0000 mg | ORAL_TABLET | Freq: Every evening | ORAL | 1 refills | Status: DC | PRN

## 2023-09-14 DEATH — deceased
